# Patient Record
Sex: Female | Born: 1950 | Race: White | Hispanic: No | Smoking: Never smoker
Health system: Southern US, Community
[De-identification: ages and names within clinical notes are randomized; demographics above are authoritative.]

## PROBLEM LIST (undated history)

## (undated) DIAGNOSIS — E785 Hyperlipidemia, unspecified: Secondary | ICD-10-CM

## (undated) DIAGNOSIS — K2970 Gastritis, unspecified, without bleeding: Secondary | ICD-10-CM

## (undated) DIAGNOSIS — I10 Essential (primary) hypertension: Secondary | ICD-10-CM

## (undated) DIAGNOSIS — E119 Type 2 diabetes mellitus without complications: Secondary | ICD-10-CM

## (undated) DIAGNOSIS — E079 Disorder of thyroid, unspecified: Secondary | ICD-10-CM

## (undated) HISTORY — PX: EYE SURGERY: SHX253

## (undated) HISTORY — PX: KNEE SURGERY: SHX244

## (undated) HISTORY — PX: THYROID SURGERY: SHX805

## (undated) HISTORY — PX: OTHER SURGICAL HISTORY: SHX169

## (undated) HISTORY — DX: Hyperlipidemia, unspecified: E78.5

## (undated) HISTORY — DX: Essential (primary) hypertension: I10

---

## 2000-02-23 ENCOUNTER — Encounter: Payer: Self-pay | Admitting: *Deleted

## 2000-02-23 ENCOUNTER — Emergency Department (HOSPITAL_COMMUNITY): Admission: EM | Admit: 2000-02-23 | Discharge: 2000-02-23 | Payer: Self-pay | Admitting: Podiatry

## 2008-12-02 DIAGNOSIS — E890 Postprocedural hypothyroidism: Secondary | ICD-10-CM | POA: Insufficient documentation

## 2011-05-31 DIAGNOSIS — F430 Acute stress reaction: Secondary | ICD-10-CM | POA: Insufficient documentation

## 2016-05-08 DIAGNOSIS — Z8719 Personal history of other diseases of the digestive system: Secondary | ICD-10-CM | POA: Insufficient documentation

## 2016-12-26 DIAGNOSIS — M1712 Unilateral primary osteoarthritis, left knee: Secondary | ICD-10-CM | POA: Insufficient documentation

## 2017-02-05 LAB — HM COLONOSCOPY

## 2017-03-12 DIAGNOSIS — Z96652 Presence of left artificial knee joint: Secondary | ICD-10-CM | POA: Insufficient documentation

## 2018-08-31 DIAGNOSIS — E042 Nontoxic multinodular goiter: Secondary | ICD-10-CM | POA: Insufficient documentation

## 2018-09-23 ENCOUNTER — Encounter (HOSPITAL_COMMUNITY): Payer: Self-pay

## 2018-09-23 ENCOUNTER — Observation Stay (HOSPITAL_COMMUNITY)
Admission: EM | Admit: 2018-09-23 | Discharge: 2018-09-24 | Disposition: A | Discharge status: Home / Self Care | Payer: Medicare Other | Attending: Family Medicine | Admitting: Family Medicine

## 2018-09-23 ENCOUNTER — Emergency Department (HOSPITAL_COMMUNITY): Payer: Medicare Other

## 2018-09-23 ENCOUNTER — Other Ambulatory Visit: Payer: Self-pay

## 2018-09-23 ENCOUNTER — Observation Stay (HOSPITAL_BASED_OUTPATIENT_CLINIC_OR_DEPARTMENT_OTHER): Payer: Medicare Other

## 2018-09-23 ENCOUNTER — Observation Stay (HOSPITAL_COMMUNITY): Payer: Medicare Other

## 2018-09-23 DIAGNOSIS — R569 Unspecified convulsions: Secondary | ICD-10-CM | POA: Diagnosis present

## 2018-09-23 DIAGNOSIS — E785 Hyperlipidemia, unspecified: Secondary | ICD-10-CM | POA: Diagnosis not present

## 2018-09-23 DIAGNOSIS — I10 Essential (primary) hypertension: Secondary | ICD-10-CM | POA: Insufficient documentation

## 2018-09-23 DIAGNOSIS — Z79899 Other long term (current) drug therapy: Secondary | ICD-10-CM | POA: Diagnosis not present

## 2018-09-23 DIAGNOSIS — Z880 Allergy status to penicillin: Secondary | ICD-10-CM | POA: Insufficient documentation

## 2018-09-23 DIAGNOSIS — Z833 Family history of diabetes mellitus: Secondary | ICD-10-CM | POA: Insufficient documentation

## 2018-09-23 DIAGNOSIS — Z7984 Long term (current) use of oral hypoglycemic drugs: Secondary | ICD-10-CM | POA: Diagnosis not present

## 2018-09-23 DIAGNOSIS — R42 Dizziness and giddiness: Secondary | ICD-10-CM | POA: Diagnosis present

## 2018-09-23 DIAGNOSIS — Z87828 Personal history of other (healed) physical injury and trauma: Secondary | ICD-10-CM | POA: Insufficient documentation

## 2018-09-23 DIAGNOSIS — G40909 Epilepsy, unspecified, not intractable, without status epilepticus: Secondary | ICD-10-CM | POA: Diagnosis not present

## 2018-09-23 DIAGNOSIS — Z8249 Family history of ischemic heart disease and other diseases of the circulatory system: Secondary | ICD-10-CM | POA: Insufficient documentation

## 2018-09-23 DIAGNOSIS — Z79890 Hormone replacement therapy (postmenopausal): Secondary | ICD-10-CM | POA: Insufficient documentation

## 2018-09-23 DIAGNOSIS — E039 Hypothyroidism, unspecified: Secondary | ICD-10-CM | POA: Diagnosis not present

## 2018-09-23 DIAGNOSIS — E1159 Type 2 diabetes mellitus with other circulatory complications: Secondary | ICD-10-CM

## 2018-09-23 DIAGNOSIS — E119 Type 2 diabetes mellitus without complications: Secondary | ICD-10-CM | POA: Diagnosis not present

## 2018-09-23 HISTORY — DX: Gastritis, unspecified, without bleeding: K29.70

## 2018-09-23 HISTORY — DX: Disorder of thyroid, unspecified: E07.9

## 2018-09-23 HISTORY — DX: Type 2 diabetes mellitus without complications: E11.9

## 2018-09-23 LAB — CBC WITH DIFFERENTIAL/PLATELET
Abs Immature Granulocytes: 0.05 10*3/uL (ref 0.00–0.07)
Basophils Absolute: 0 10*3/uL (ref 0.0–0.1)
Basophils Relative: 0 %
Eosinophils Absolute: 0.1 10*3/uL (ref 0.0–0.5)
Eosinophils Relative: 2 %
HCT: 39 % (ref 36.0–46.0)
Hemoglobin: 13.3 g/dL (ref 12.0–15.0)
Immature Granulocytes: 1 %
Lymphocytes Relative: 19 %
Lymphs Abs: 1.4 10*3/uL (ref 0.7–4.0)
MCH: 30.2 pg (ref 26.0–34.0)
MCHC: 34.1 g/dL (ref 30.0–36.0)
MCV: 88.4 fL (ref 80.0–100.0)
Monocytes Absolute: 0.3 10*3/uL (ref 0.1–1.0)
Monocytes Relative: 5 %
Neutro Abs: 5.3 10*3/uL (ref 1.7–7.7)
Neutrophils Relative %: 73 %
Platelets: 270 10*3/uL (ref 150–400)
RBC: 4.41 MIL/uL (ref 3.87–5.11)
RDW: 12.3 % (ref 11.5–15.5)
WBC: 7.2 10*3/uL (ref 4.0–10.5)
nRBC: 0 % (ref 0.0–0.2)

## 2018-09-23 LAB — URINALYSIS, ROUTINE W REFLEX MICROSCOPIC
Bacteria, UA: NONE SEEN
Bilirubin Urine: NEGATIVE
Glucose, UA: >=500 mg/dL — AB
Hgb urine dipstick: NEGATIVE
Ketones, ur: NEGATIVE mg/dL
Leukocytes,Ua: NEGATIVE
Nitrite: NEGATIVE
Protein, ur: NEGATIVE mg/dL
Specific Gravity, Urine: 1.01 (ref 1.005–1.030)
pH: 7 (ref 5.0–8.0)

## 2018-09-23 LAB — COMPREHENSIVE METABOLIC PANEL
ALT: 12 U/L (ref 0–44)
AST: 16 U/L (ref 15–41)
Albumin: 3.8 g/dL (ref 3.5–5.0)
Alkaline Phosphatase: 76 U/L (ref 38–126)
Anion gap: 10 (ref 5–15)
BUN: 9 mg/dL (ref 8–23)
CO2: 23 mmol/L (ref 22–32)
Calcium: 8.4 mg/dL — ABNORMAL LOW (ref 8.9–10.3)
Chloride: 101 mmol/L (ref 98–111)
Creatinine, Ser: 0.56 mg/dL (ref 0.44–1.00)
GFR calc Af Amer: >60 mL/min (ref >60)
GFR calc non Af Amer: >60 mL/min (ref >60)
Glucose, Bld: 165 mg/dL — ABNORMAL HIGH (ref 70–99)
Potassium: 4.1 mmol/L (ref 3.5–5.1)
Sodium: 134 mmol/L — ABNORMAL LOW (ref 135–145)
Total Bilirubin: 0.5 mg/dL (ref 0.3–1.2)
Total Protein: 6.3 g/dL — ABNORMAL LOW (ref 6.5–8.1)

## 2018-09-23 LAB — TROPONIN I: Troponin I: <0.03 ng/mL (ref <0.03)

## 2018-09-23 LAB — GLUCOSE, CAPILLARY
Glucose-Capillary: 100 mg/dL — ABNORMAL HIGH (ref 70–99)
Glucose-Capillary: 175 mg/dL — ABNORMAL HIGH (ref 70–99)

## 2018-09-23 LAB — CBG MONITORING, ED: Glucose-Capillary: 188 mg/dL — ABNORMAL HIGH (ref 70–99)

## 2018-09-23 LAB — ECHOCARDIOGRAM COMPLETE

## 2018-09-23 IMAGING — CT CT HEAD WITHOUT CONTRAST
3 series · 16 of 47 positions shown, 19 images · non-contrast
Comparison: None.

CLINICAL DATA: Altered level of consciousness

EXAM:
CT HEAD WITHOUT CONTRAST
TECHNIQUE: Contiguous axial images were obtained from the base of the skull
through the vertex without intravenous contrast.

[Series 2: head trauma wo · axial · 0.43mm/px · z∈[+59,+184]mm · 10 of 31 slices shown, 13 images]
[im 3/31  brain]
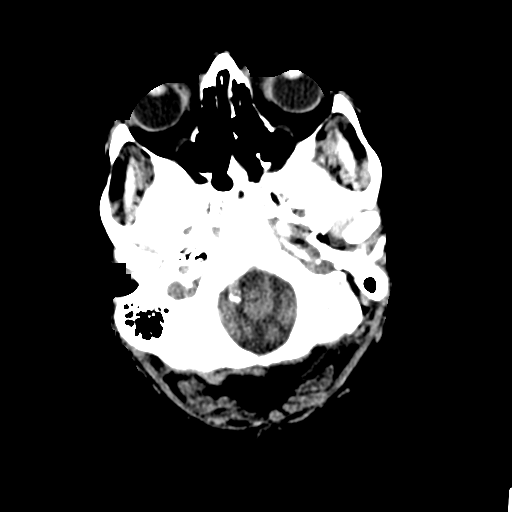
[im 3/31  bone]
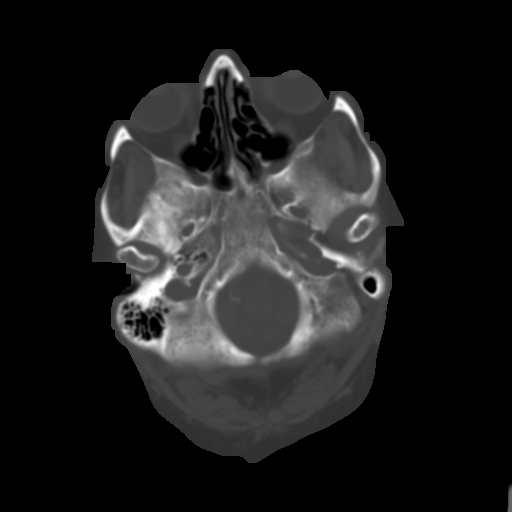
[im 6/31  brain]
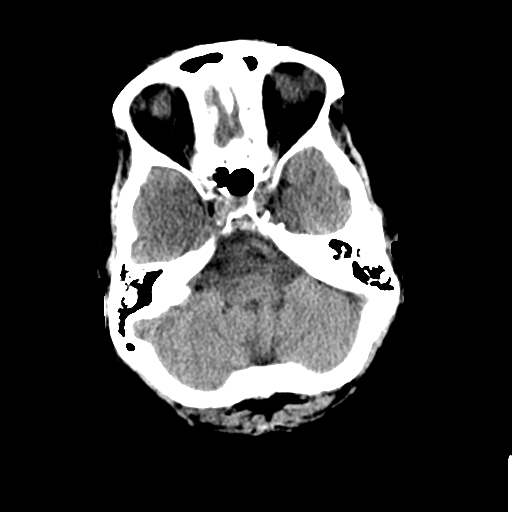
[im 9/31  brain]
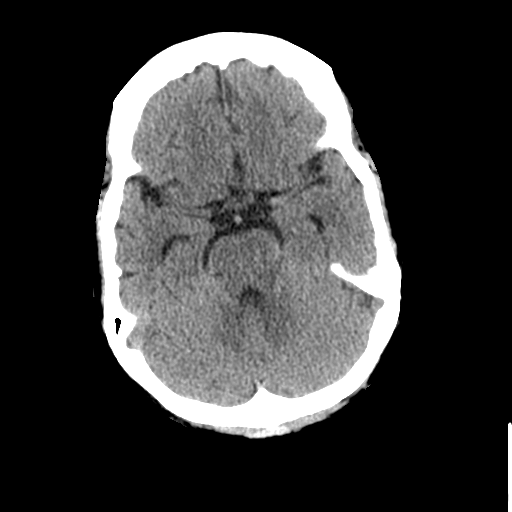
[im 11/31  brain]
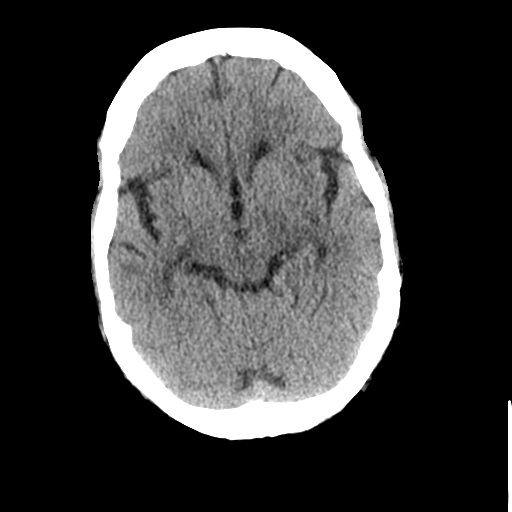
[im 14/31  brain]
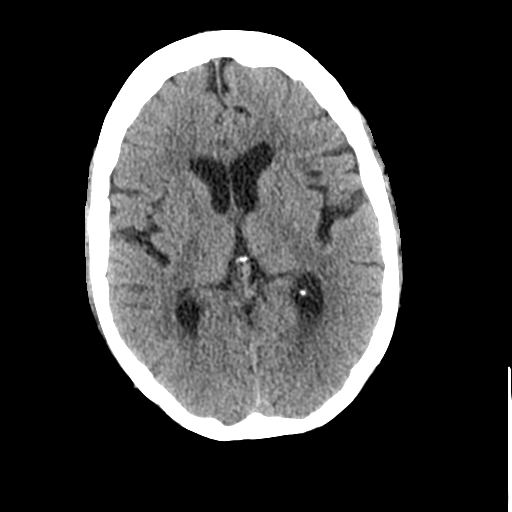
[im 14/31  bone]
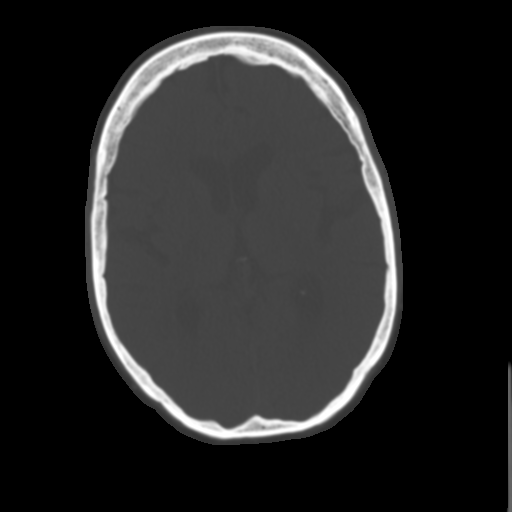
[im 17/31  brain]
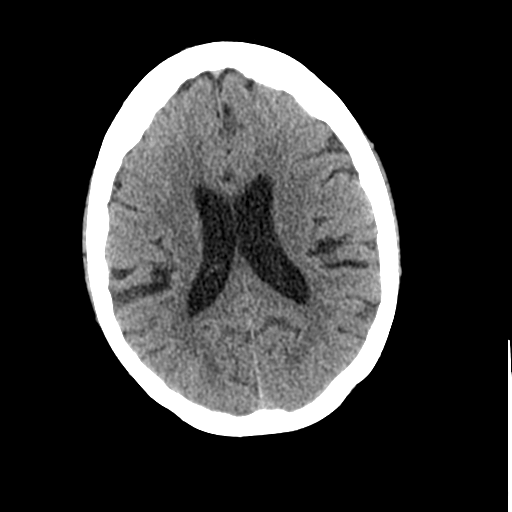
[im 20/31  brain]
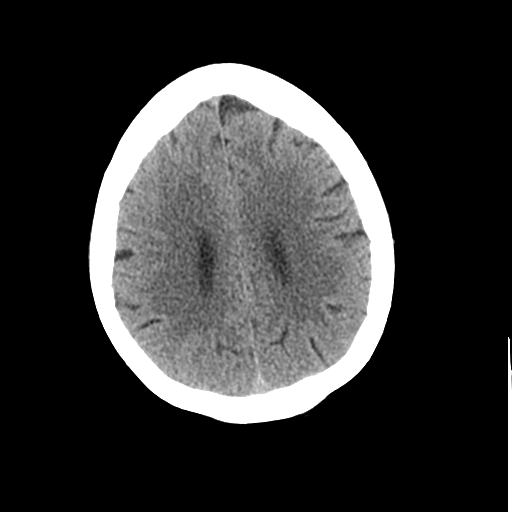
[im 23/31  brain]
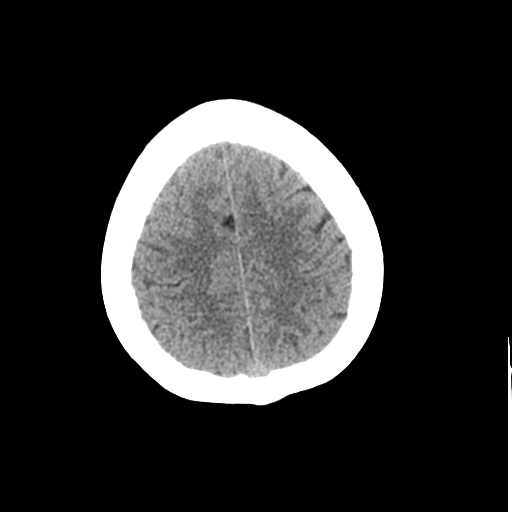
[im 25/31  brain]
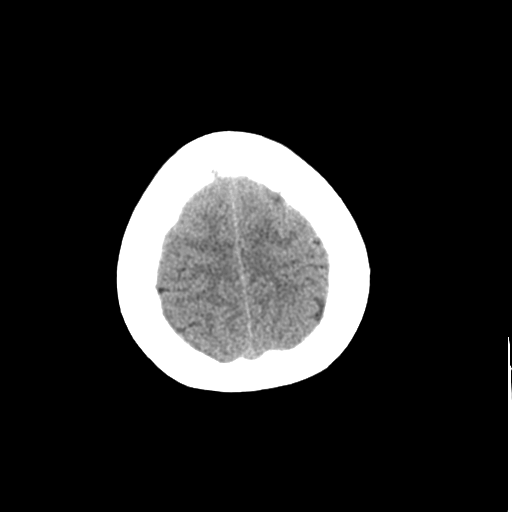
[im 25/31  bone]
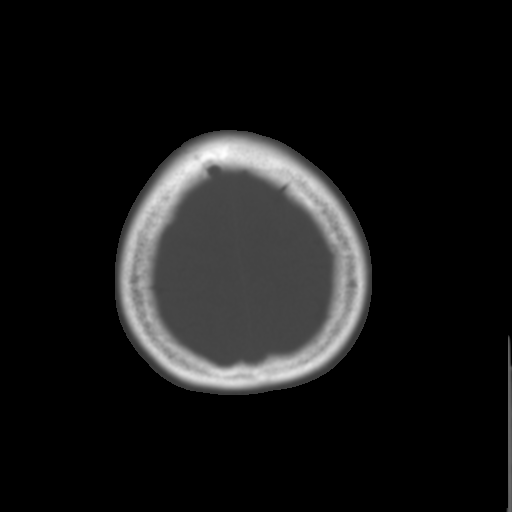
[im 28/31  brain]
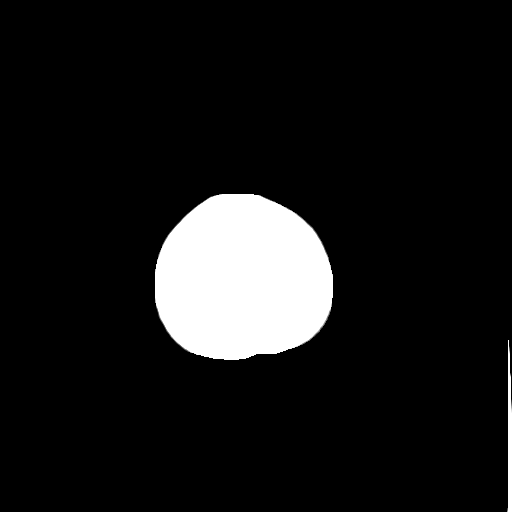

[Series 4: coronal soft tissue · coronal · 0.31mm/px · 3 of 61 slices shown]
[im 21/61  brain]
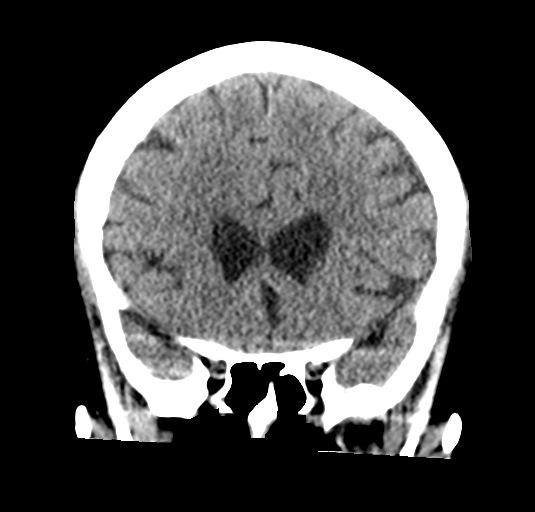
[im 27/61  brain]
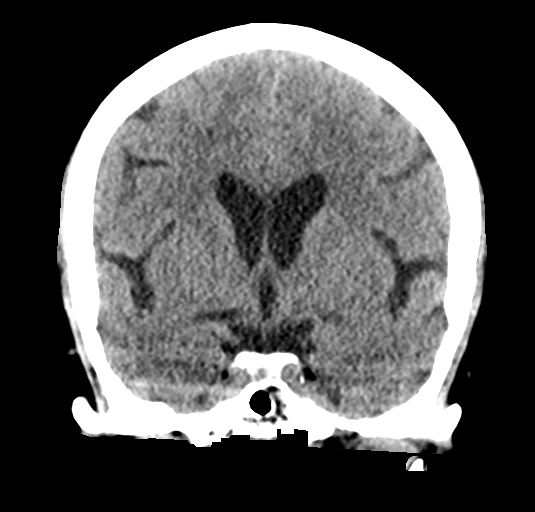
[im 34/61  brain]
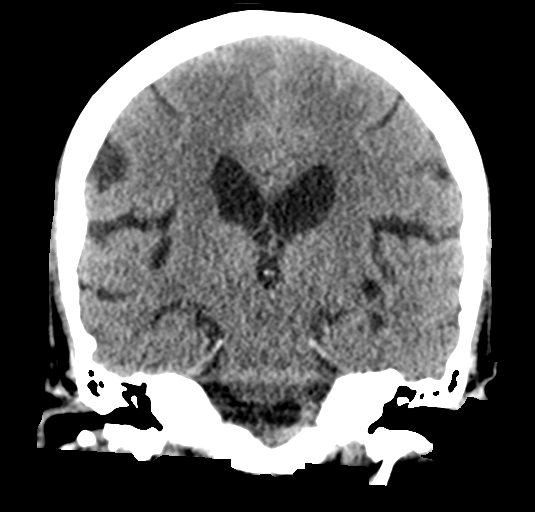

[Series 5: sagittal soft tissue · sagittal · 0.31mm/px · 3 of 55 slices shown]
[im 19/55  brain]
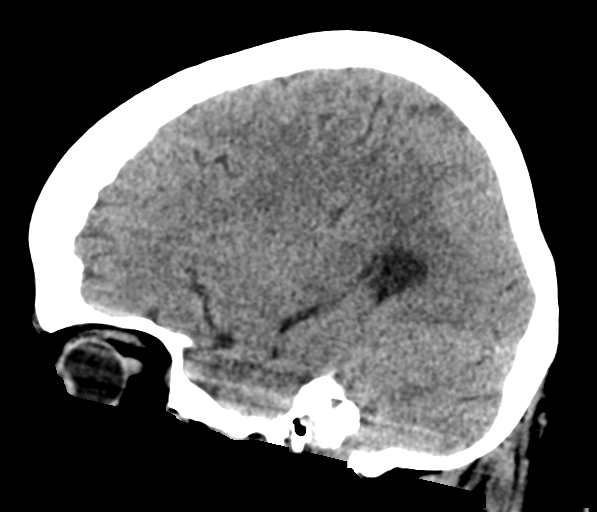
[im 28/55  brain]
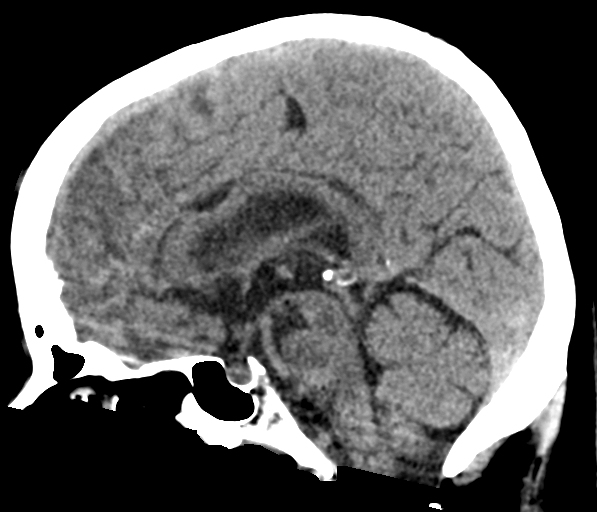
[im 37/55  brain]
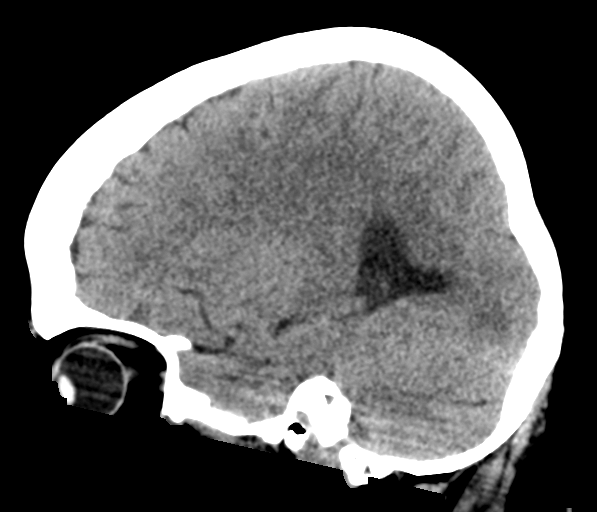

[16 of 47 positions shown; findings below may reference images not displayed]

FINDINGS: Brain: No evidence of acute infarction, hemorrhage, extra-axial
collection, ventriculomegaly, or mass effect. Generalized cerebral
atrophy. Periventricular white matter low attenuation likely
secondary to microangiopathy.

Vascular: Cerebrovascular atherosclerotic calcifications are noted.

Skull: Negative for fracture or focal lesion.

Sinuses/Orbits: Visualized portions of the orbits are unremarkable.
Visualized portions of the paranasal sinuses and mastoid air cells
are unremarkable.

Other: None.
IMPRESSION: No acute intracranial pathology.

## 2018-09-23 IMAGING — DX CHEST - 2 VIEW
2 series · 2 of 2 positions shown · non-contrast
Comparison: None.

CLINICAL DATA: Nausea and vomiting

EXAM:
CHEST - 2 VIEW

[chest lat]
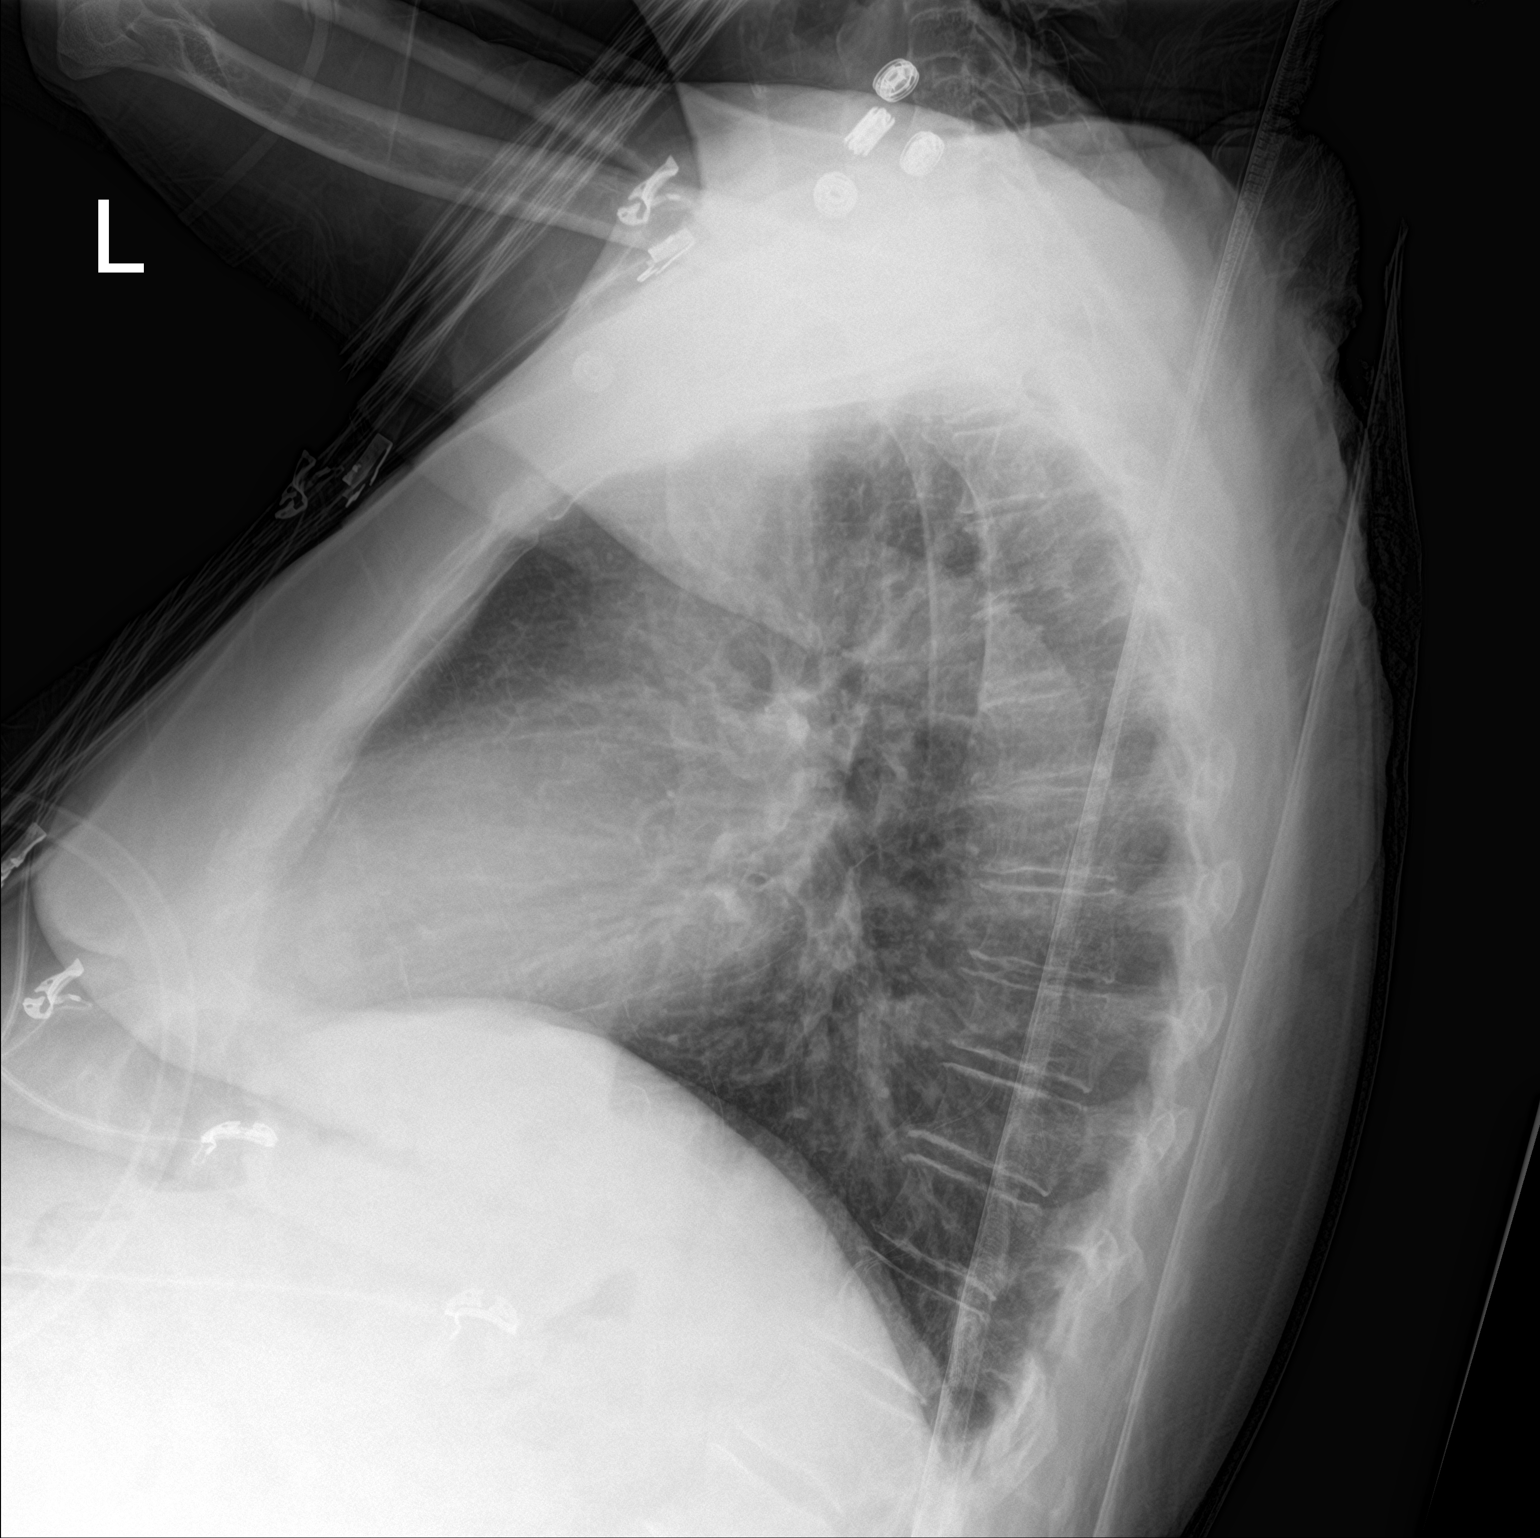

[chest ap]
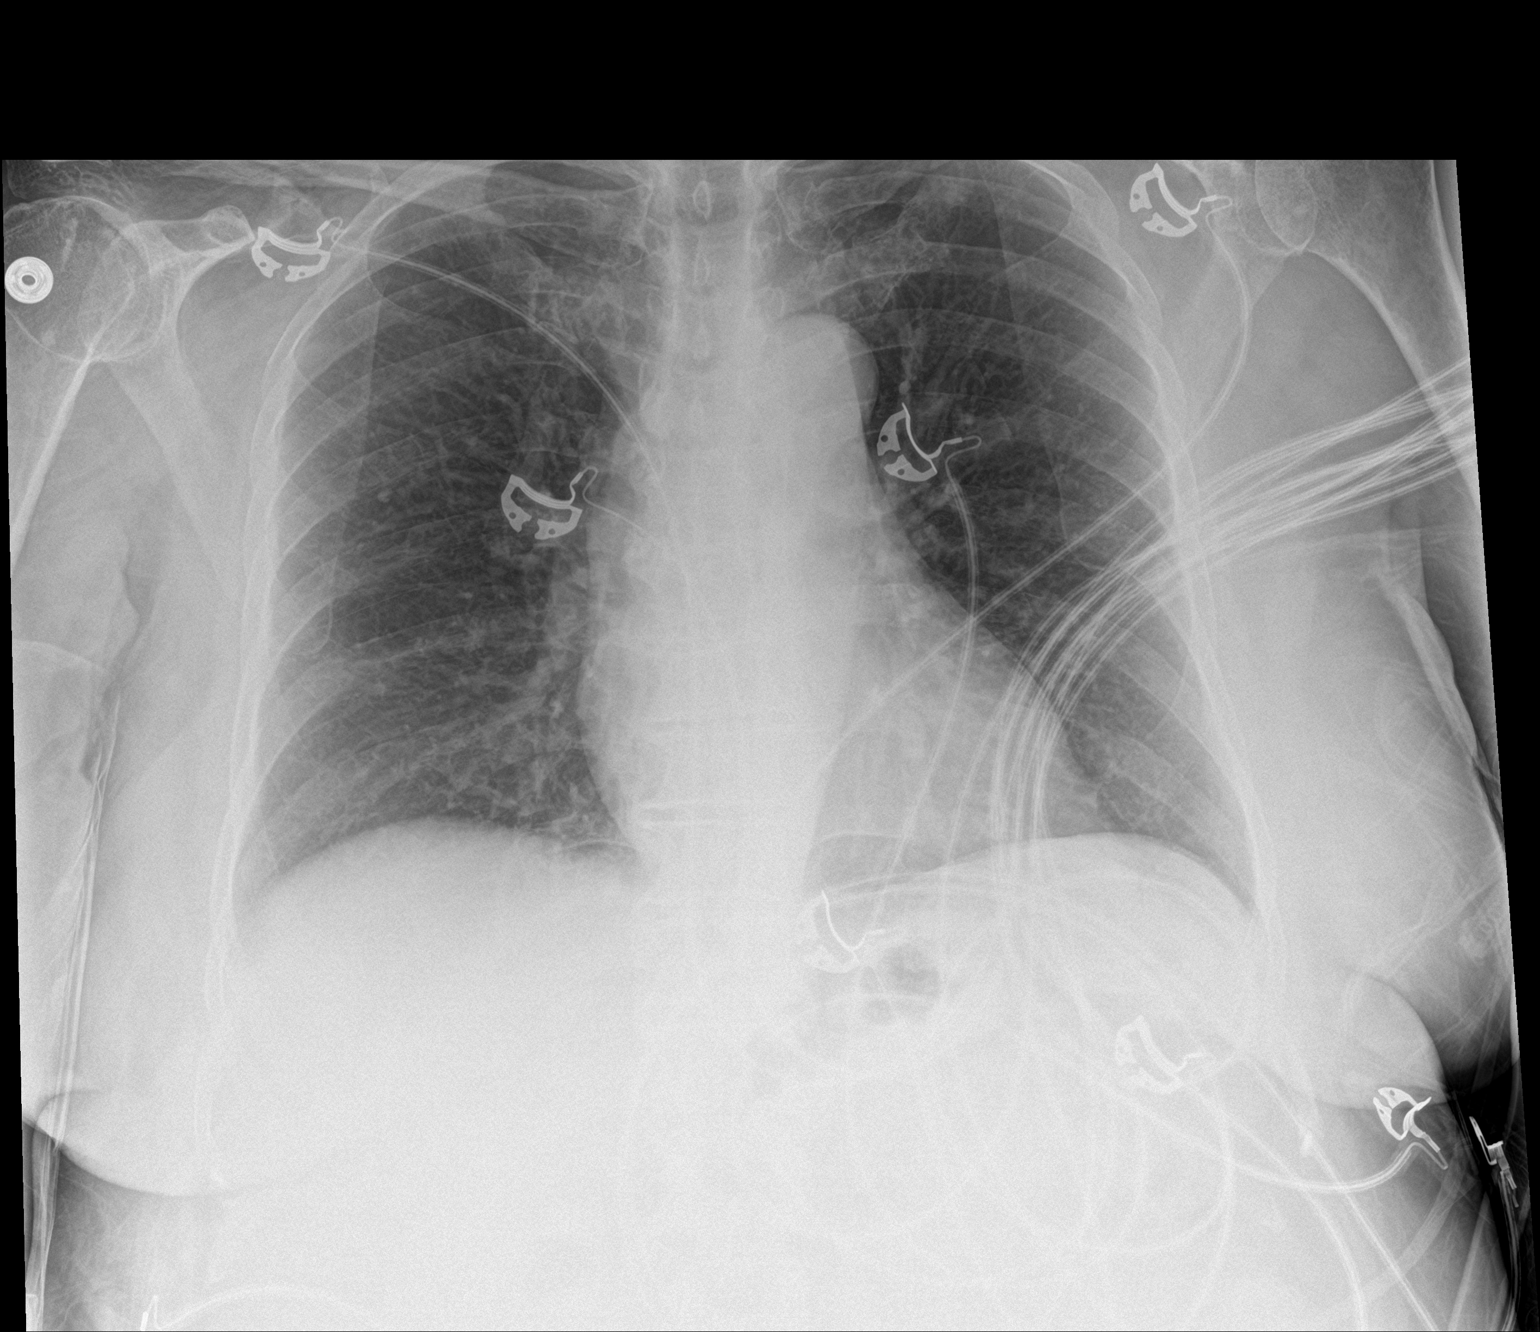

[2 of 2 positions shown; findings below may reference images not displayed]

FINDINGS: The heart size and mediastinal contours are within normal limits.
Both lungs are clear. The visualized skeletal structures are
unremarkable.
IMPRESSION: No active cardiopulmonary disease.

## 2018-09-23 IMAGING — MR MRA HEAD WITHOUT CONTRAST
1 series · 14 of 48 positions shown · IV contrast (gadavist)
Comparison: Prior head CT from earlier the same day.

CLINICAL DATA: Initial evaluation for acute dizziness, syncope,
seizures.

EXAM:
MRI HEAD WITHOUT AND WITH CONTRAST
MRA HEAD WITHOUT CONTRAST
MRA NECK WITHOUT AND WITH CONTRAST
TECHNIQUE: Multiplanar, multiecho pulse sequences of the brain and surrounding
structures were obtained without intravenous contrast. Angiographic
images of the Circle of Willis were obtained using MRA technique
without intravenous contrast. Angiographic images of the neck were
obtained using MRA technique without and with intravenous contrast.
Carotid stenosis measurements (when applicable) are obtained
utilizing NASCET criteria, using the distal internal carotid
diameter as the denominator.
CONTRAST:  6 cc of Gadavist.

[Series 2: MRA · axial · 0.8mm · 0.38mm/px · z∈[-109,-10]mm · 14 of 131 slices shown]
[im 1/131]
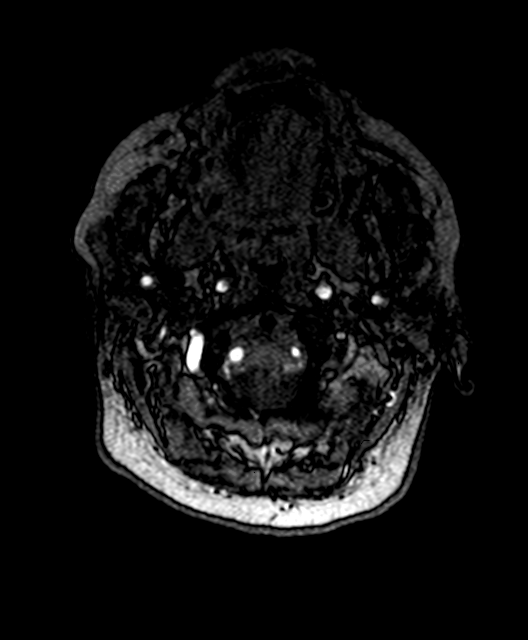
[im 3/131]
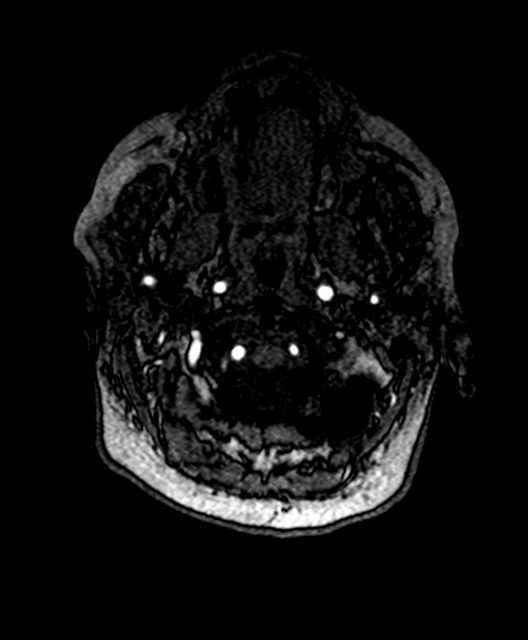
[im 6/131]
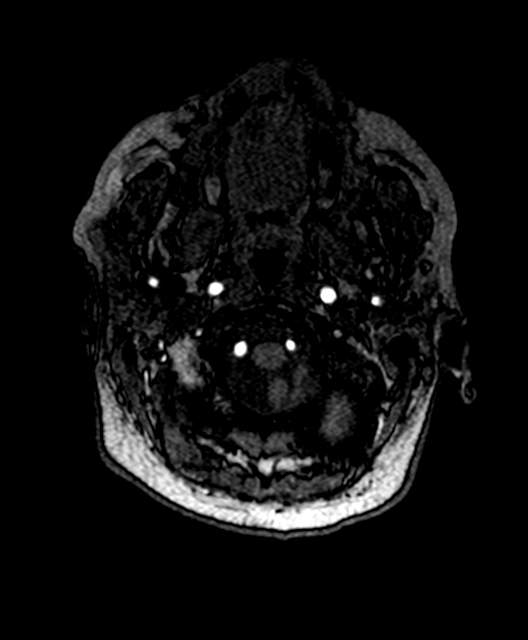
[im 9/131]
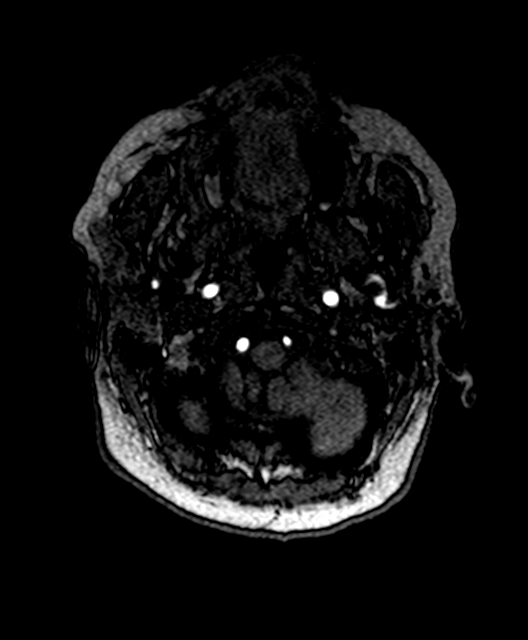
[im 23/131]
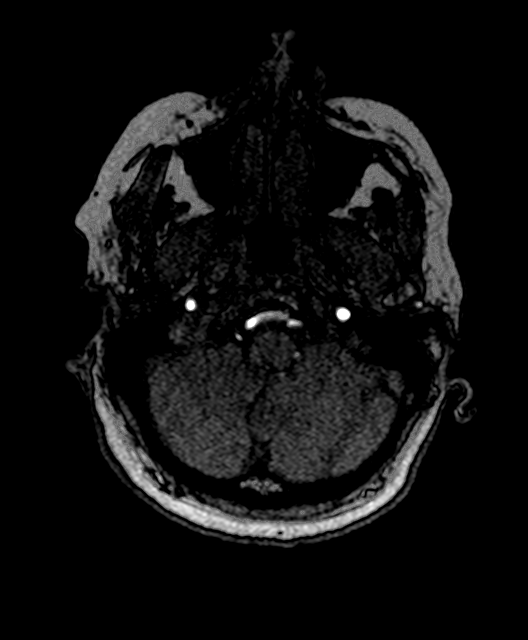
[im 25/131]
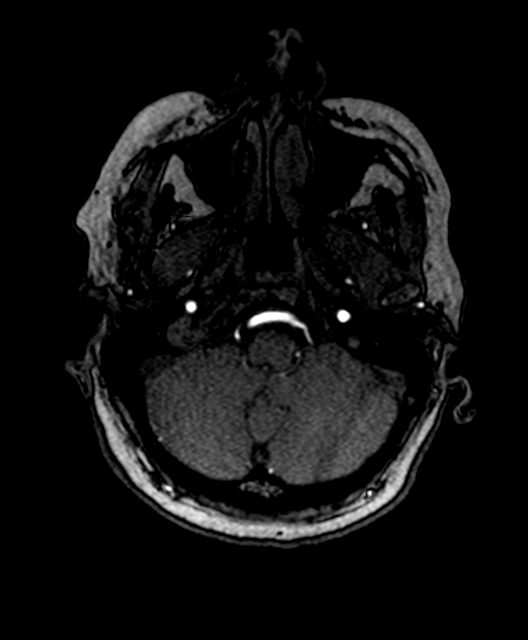
[im 42/131]
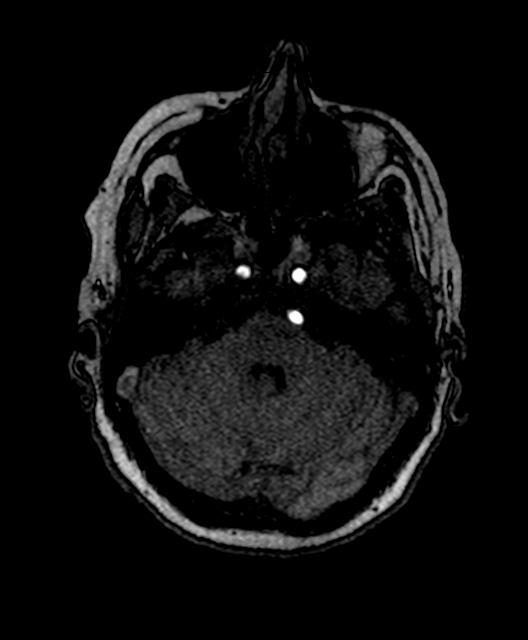
[im 59/131]
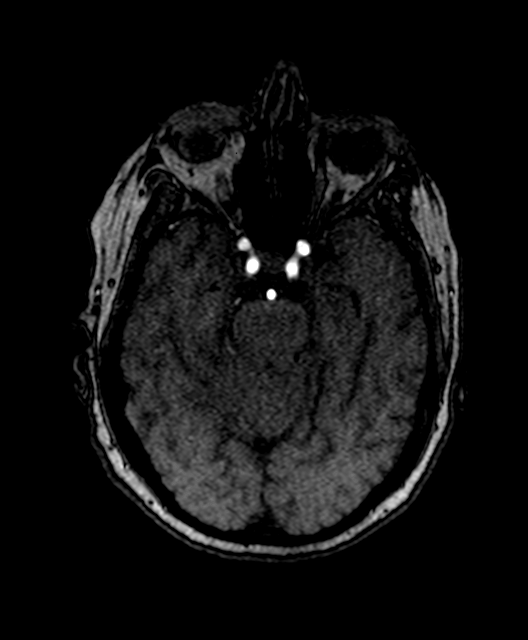
[im 67/131]
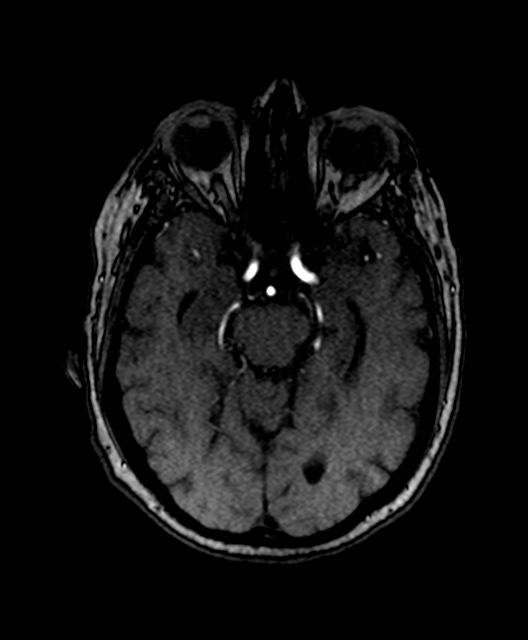
[im 75/131]
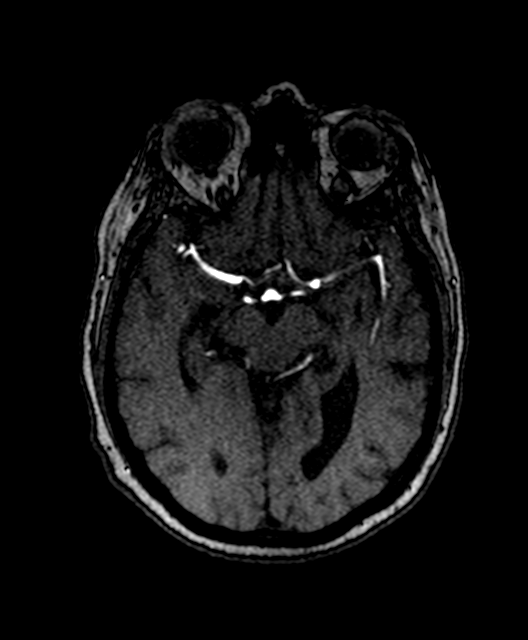
[im 92/131]
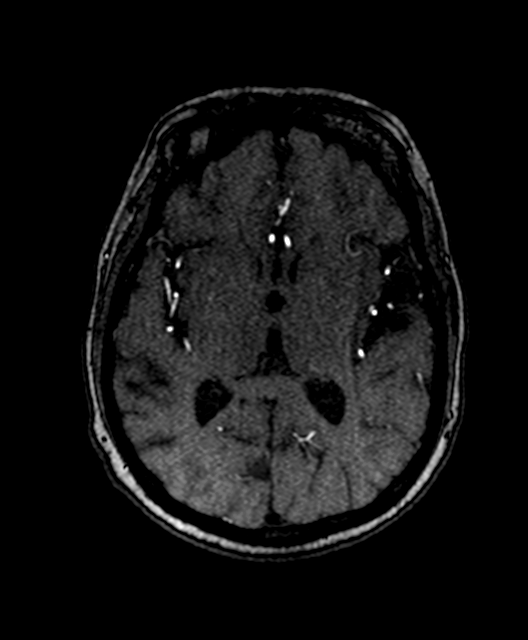
[im 108/131]
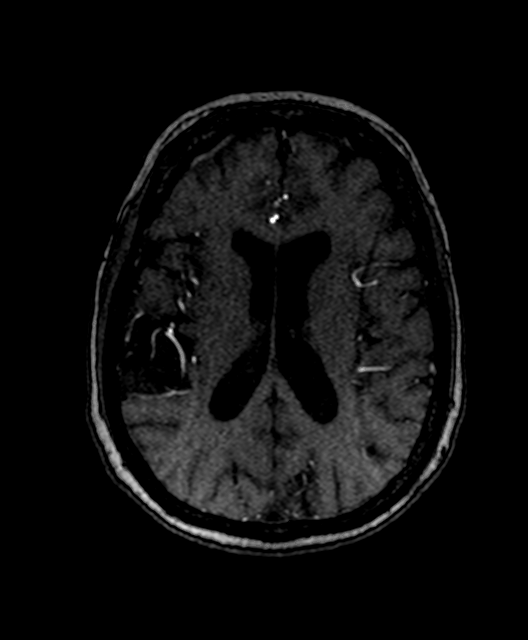
[im 111/131]
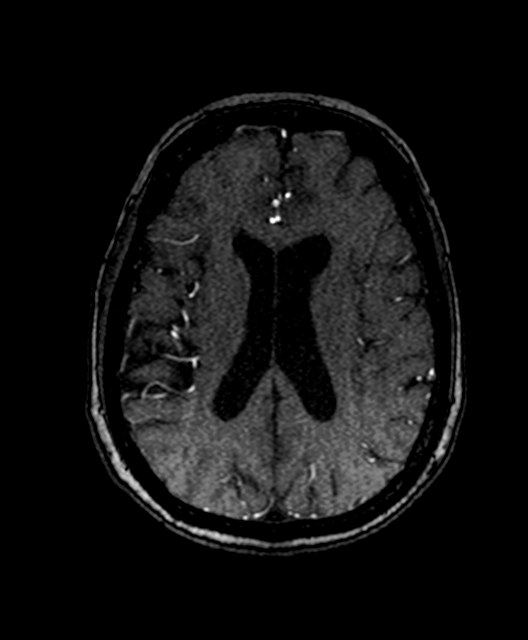
[im 125/131]
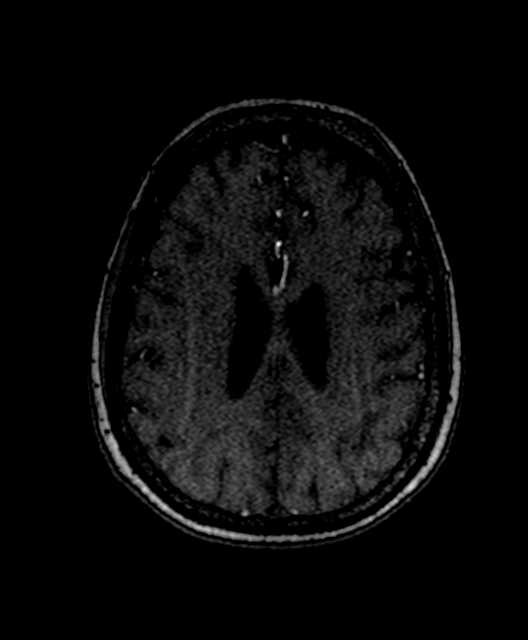

[14 of 48 positions shown; findings below may reference images not displayed]

FINDINGS: MRI HEAD FINDINGS

Brain: Cerebral volume within normal limits for patient age. Mild
hazy T2/FLAIR hyperintensity seen within the periventricular white
matter, nonspecific, but felt to be within normal limits for age.

No abnormal foci of restricted diffusion to suggest acute or
subacute ischemia. Gray-white matter differentiation well
maintained. No encephalomalacia to suggest chronic infarction. No
foci of susceptibility artifact to suggest acute or chronic
intracranial hemorrhage.

No mass lesion, midline shift or mass effect. No hydrocephalus. No
extra-axial fluid collection. Major dural sinuses are grossly
patent.

Pituitary gland and suprasellar region are normal. Midline
structures intact and normal.

No abnormal enhancement.  No intrinsic temporal lobe abnormality.

Vascular: Major intracranial vascular flow voids well maintained and
normal in appearance.

Skull and upper cervical spine: Craniocervical junction normal.
Visualized upper cervical spine within normal limits. Bone marrow
signal intensity normal. No scalp soft tissue abnormality.

Sinuses/Orbits: Globes and orbital soft tissues within normal
limits.

Paranasal sinuses are clear. No mastoid effusion. Inner ear
structures normal.

Other: None.

MRA HEAD FINDINGS

ANTERIOR CIRCULATION:

Distal cervical segments of the internal carotid arteries are patent
with symmetric antegrade flow. Petrous, cavernous, and supraclinoid
segments patent without hemodynamically significant stenosis. ICA
termini well perfused. Left A1 widely patent. Right A1 hypoplastic
but patent as well. Normal anterior communicating artery. Anterior
cerebral arteries widely patent to their distal aspects without
stenosis. M1 segments widely patent without stenosis. Normal MCA
bifurcations. Distal MCA branches well perfused and symmetric.

POSTERIOR CIRCULATION:

Vertebral arteries patent to the vertebrobasilar junction without
stenosis. Right vertebral artery dominant. Posterior inferior
cerebral arteries patent bilaterally. Basilar widely patent to its
distal aspect. Superior cerebellar and posterior cerebral arteries
well perfused bilaterally.

No intracranial aneurysm or other vascular abnormality.

MRA NECK FINDINGS

Source images reviewed. Patent antegrade flow seen within both
carotid and vertebral arteries bilaterally on time-of-flight
sequence.

Visualized aortic arch of normal caliber with normal branch pattern.
No flow-limiting stenosis seen about the origin of the great
vessels. Visualized subclavian arteries widely patent.

Right common carotid artery patent from its origin to the
bifurcation without visible stenosis. No significant atheromatous
narrowing about the right bifurcation. Right ICA widely patent to
the skull base without stenosis, occlusion, or evidence for
dissection.

Left common carotid artery patent from its origin to the bifurcation
without stenosis. No significant atheromatous narrowing about the
left bifurcation. Left ICA patent from the bifurcation to the skull
base without stenosis, occlusion, or evidence for dissection.

Both vertebral arteries arise from the subclavian arteries. Right
vertebral artery dominant. Vertebral arteries widely patent within
the neck without stenosis, dissection, or occlusion.
IMPRESSION: MRI HEAD IMPRESSION:

Normal brain MRI. No acute intracranial abnormality or findings to
explain patient's symptoms identified.

MRA HEAD IMPRESSION:

Normal intracranial MRA.

MRA NECK IMPRESSION:

Normal MRA of the neck.

## 2018-09-23 IMAGING — MR MRI AND MRA NECK WITHOUT AND WITH CONTRAST
2 series · 19 of 48 positions shown · IV contrast (gadavist)
Comparison: Prior head CT from earlier the same day.

CLINICAL DATA: Initial evaluation for acute dizziness, syncope,
seizures.

EXAM:
MRI HEAD WITHOUT AND WITH CONTRAST
MRA HEAD WITHOUT CONTRAST
MRA NECK WITHOUT AND WITH CONTRAST
TECHNIQUE: Multiplanar, multiecho pulse sequences of the brain and surrounding
structures were obtained without intravenous contrast. Angiographic
images of the Circle of Willis were obtained using MRA technique
without intravenous contrast. Angiographic images of the neck were
obtained using MRA technique without and with intravenous contrast.
Carotid stenosis measurements (when applicable) are obtained
utilizing NASCET criteria, using the distal internal carotid
diameter as the denominator.
CONTRAST:  6 cc of Gadavist.

[Series 4: [id number] · axial · 1.0mm · 0.52mm/px · z∈[-220,-94]mm · 10 of 136 slices shown]
[im 5/136]
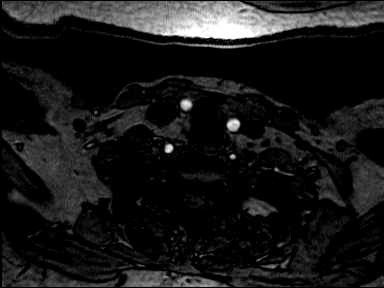
[im 24/136]
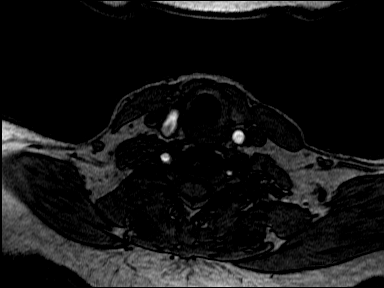
[im 42/136]
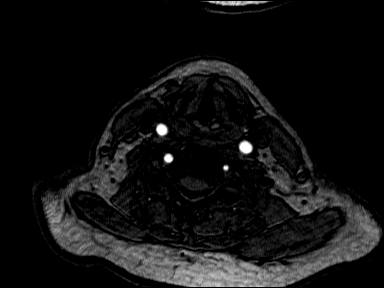
[im 61/136]
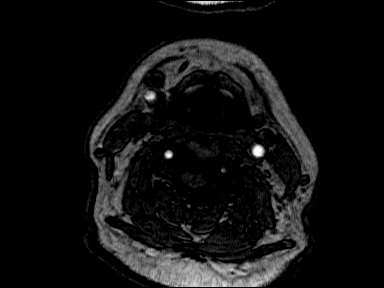
[im 70/136]
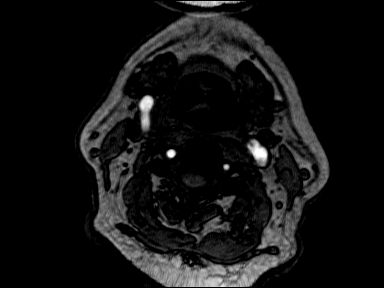
[im 75/136]
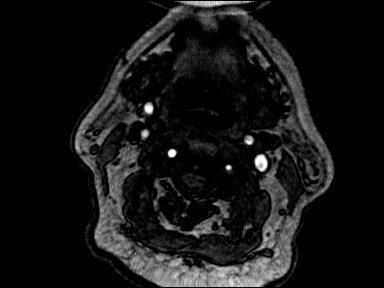
[im 94/136]
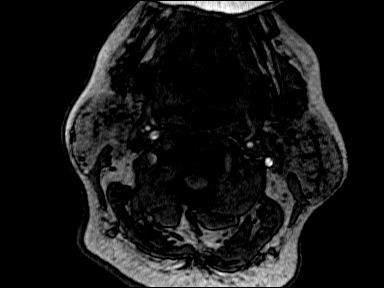
[im 112/136]
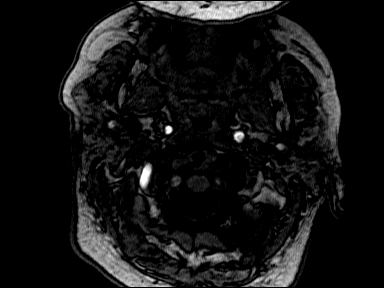
[im 117/136]
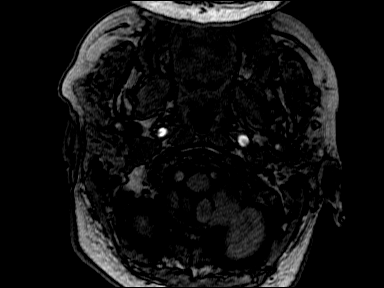
[im 131/136]
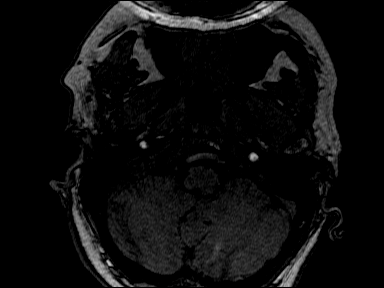

[Series 103: [id number] · coronal · 1.2mm · 0.41mm/px · 9 of 79 slices shown]
[im 5/79]
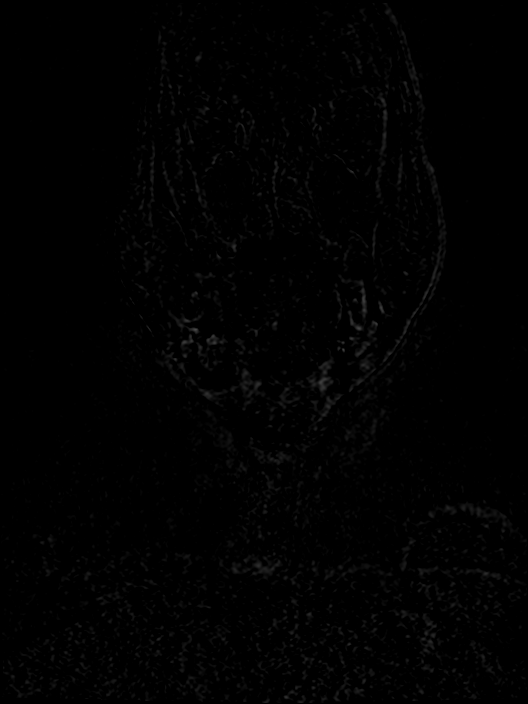
[im 14/79]
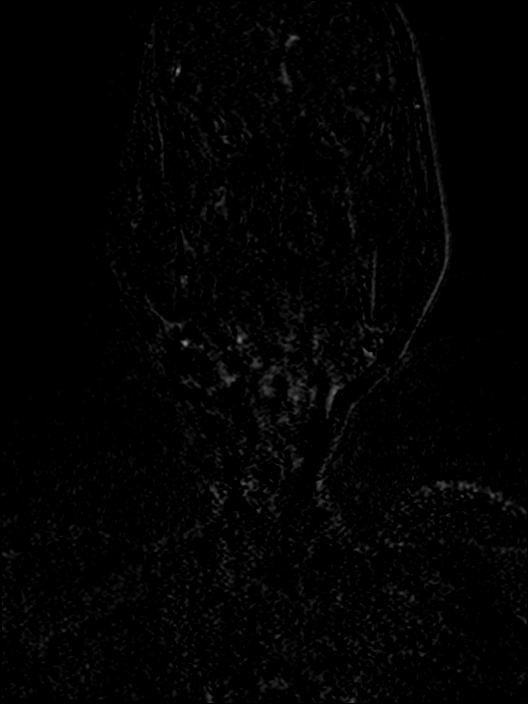
[im 23/79]
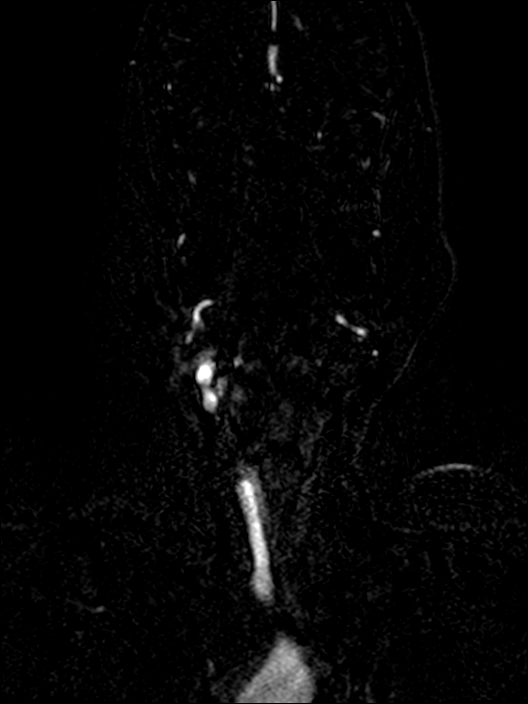
[im 33/79]
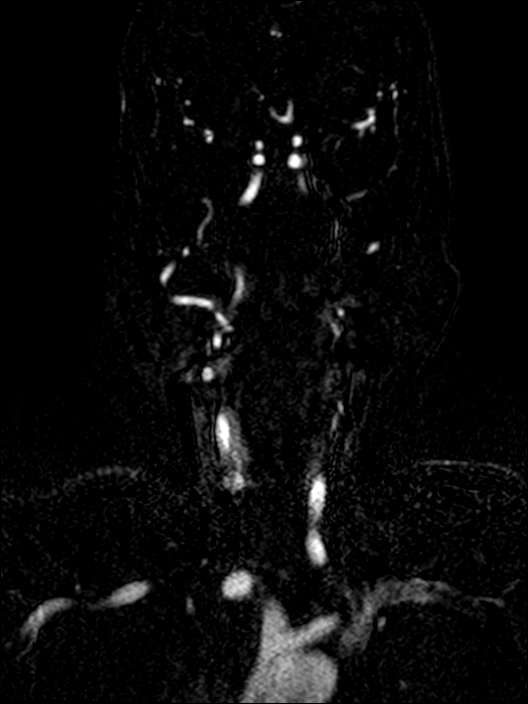
[im 42/79]
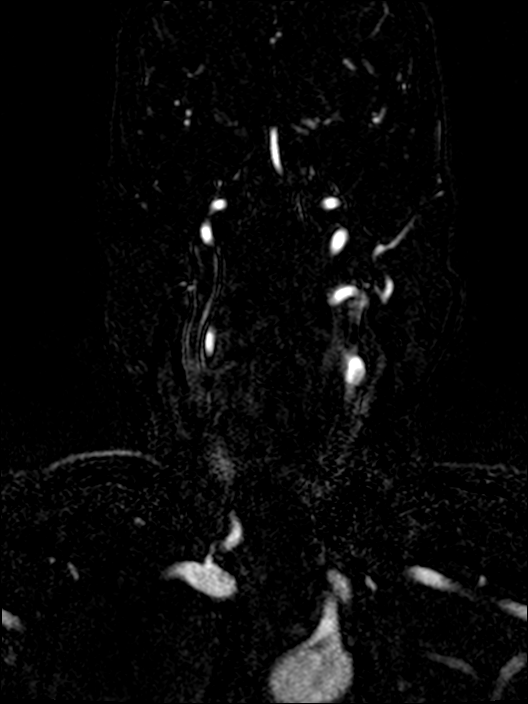
[im 46/79]
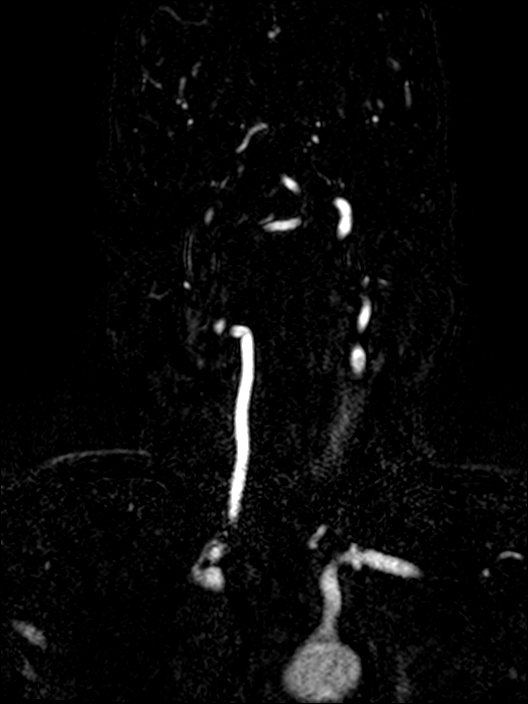
[im 56/79]
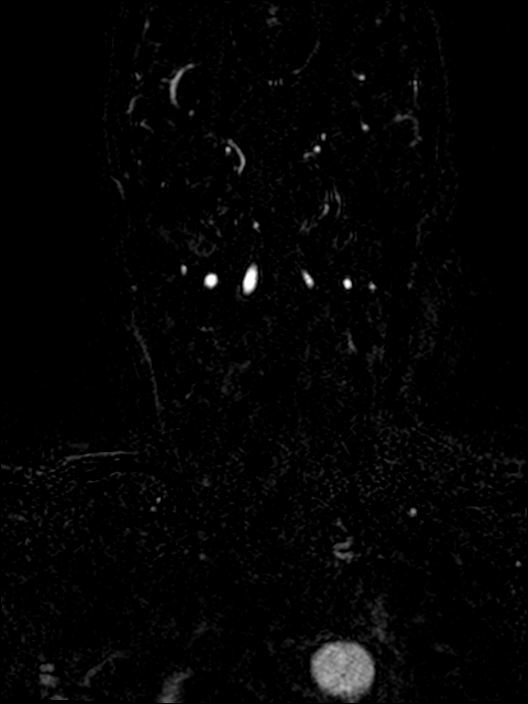
[im 65/79]
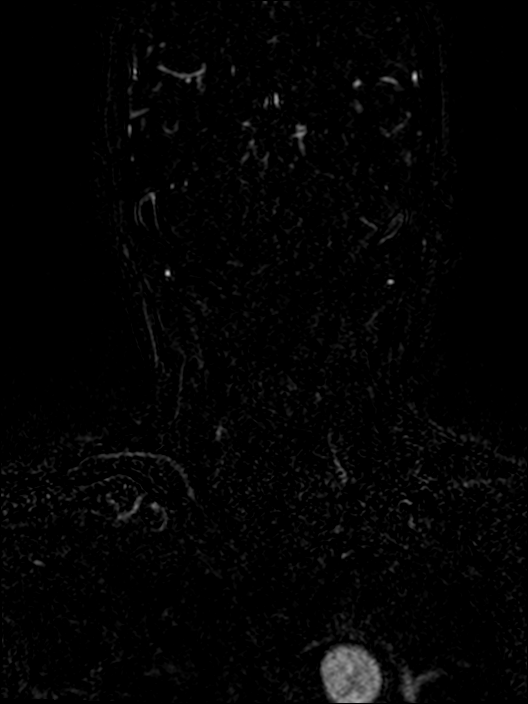
[im 69/79]
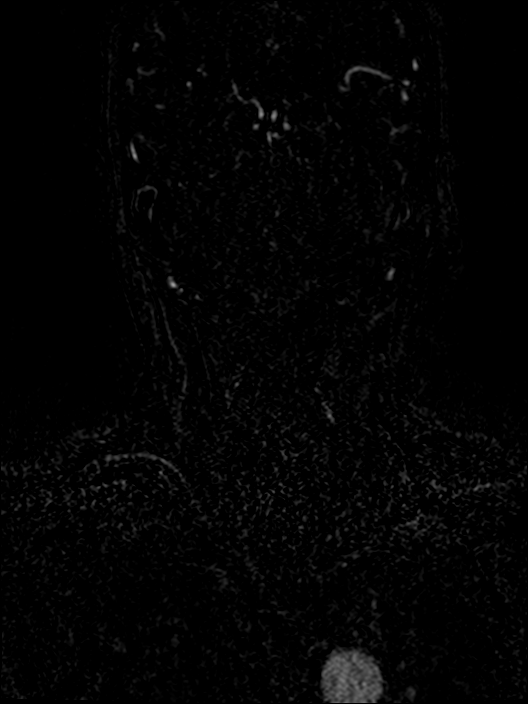

[19 of 48 positions shown; findings below may reference images not displayed]

FINDINGS: MRI HEAD FINDINGS

Brain: Cerebral volume within normal limits for patient age. Mild
hazy T2/FLAIR hyperintensity seen within the periventricular white
matter, nonspecific, but felt to be within normal limits for age.

No abnormal foci of restricted diffusion to suggest acute or
subacute ischemia. Gray-white matter differentiation well
maintained. No encephalomalacia to suggest chronic infarction. No
foci of susceptibility artifact to suggest acute or chronic
intracranial hemorrhage.

No mass lesion, midline shift or mass effect. No hydrocephalus. No
extra-axial fluid collection. Major dural sinuses are grossly
patent.

Pituitary gland and suprasellar region are normal. Midline
structures intact and normal.

No abnormal enhancement.  No intrinsic temporal lobe abnormality.

Vascular: Major intracranial vascular flow voids well maintained and
normal in appearance.

Skull and upper cervical spine: Craniocervical junction normal.
Visualized upper cervical spine within normal limits. Bone marrow
signal intensity normal. No scalp soft tissue abnormality.

Sinuses/Orbits: Globes and orbital soft tissues within normal
limits.

Paranasal sinuses are clear. No mastoid effusion. Inner ear
structures normal.

Other: None.

MRA HEAD FINDINGS

ANTERIOR CIRCULATION:

Distal cervical segments of the internal carotid arteries are patent
with symmetric antegrade flow. Petrous, cavernous, and supraclinoid
segments patent without hemodynamically significant stenosis. ICA
termini well perfused. Left A1 widely patent. Right A1 hypoplastic
but patent as well. Normal anterior communicating artery. Anterior
cerebral arteries widely patent to their distal aspects without
stenosis. M1 segments widely patent without stenosis. Normal MCA
bifurcations. Distal MCA branches well perfused and symmetric.

POSTERIOR CIRCULATION:

Vertebral arteries patent to the vertebrobasilar junction without
stenosis. Right vertebral artery dominant. Posterior inferior
cerebral arteries patent bilaterally. Basilar widely patent to its
distal aspect. Superior cerebellar and posterior cerebral arteries
well perfused bilaterally.

No intracranial aneurysm or other vascular abnormality.

MRA NECK FINDINGS

Source images reviewed. Patent antegrade flow seen within both
carotid and vertebral arteries bilaterally on time-of-flight
sequence.

Visualized aortic arch of normal caliber with normal branch pattern.
No flow-limiting stenosis seen about the origin of the great
vessels. Visualized subclavian arteries widely patent.

Right common carotid artery patent from its origin to the
bifurcation without visible stenosis. No significant atheromatous
narrowing about the right bifurcation. Right ICA widely patent to
the skull base without stenosis, occlusion, or evidence for
dissection.

Left common carotid artery patent from its origin to the bifurcation
without stenosis. No significant atheromatous narrowing about the
left bifurcation. Left ICA patent from the bifurcation to the skull
base without stenosis, occlusion, or evidence for dissection.

Both vertebral arteries arise from the subclavian arteries. Right
vertebral artery dominant. Vertebral arteries widely patent within
the neck without stenosis, dissection, or occlusion.
IMPRESSION: MRI HEAD IMPRESSION:

Normal brain MRI. No acute intracranial abnormality or findings to
explain patient's symptoms identified.

MRA HEAD IMPRESSION:

Normal intracranial MRA.

MRA NECK IMPRESSION:

Normal MRA of the neck.

## 2018-09-23 IMAGING — MR MRI HEAD WITHOUT AND WITH CONTRAST
7 of 14 series · 25 of 48 positions shown · IV contrast (gadavist)
Comparison: Prior head CT from earlier the same day.

CLINICAL DATA: Initial evaluation for acute dizziness, syncope,
seizures.

EXAM:
MRI HEAD WITHOUT AND WITH CONTRAST
MRA HEAD WITHOUT CONTRAST
MRA NECK WITHOUT AND WITH CONTRAST
TECHNIQUE: Multiplanar, multiecho pulse sequences of the brain and surrounding
structures were obtained without intravenous contrast. Angiographic
images of the Circle of Willis were obtained using MRA technique
without intravenous contrast. Angiographic images of the neck were
obtained using MRA technique without and with intravenous contrast.
Carotid stenosis measurements (when applicable) are obtained
utilizing NASCET criteria, using the distal internal carotid
diameter as the denominator.
CONTRAST:  6 cc of Gadavist.

[Series 2: DWI · axial · 3.0mm · 0.63mm/px · z∈[-125,+36]mm · 4 of 55 slices shown (1 of 4)]
[im 1/55]
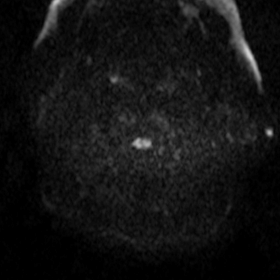
[im 19/55]
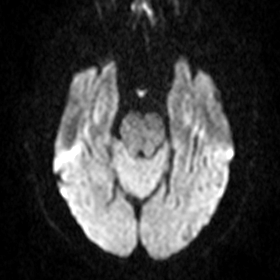
[im 37/55]
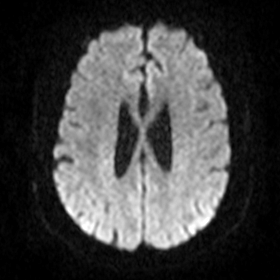
[im 55/55]
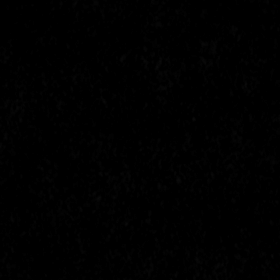

[Series 3: DWI · axial · 3.0mm · 0.63mm/px · z∈[-125,+33]mm · 4 of 54 slices shown (2 of 4)]
[im 1/54]
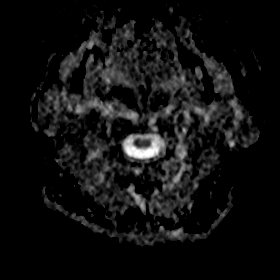
[im 18/54]
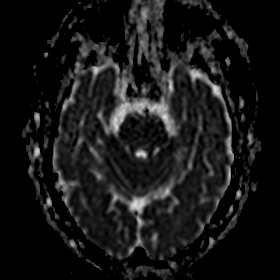
[im 36/54]
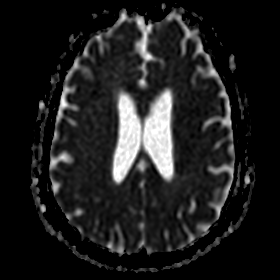
[im 54/54]
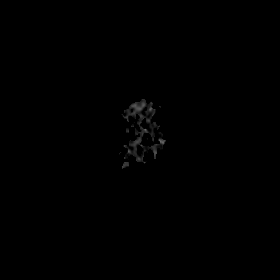

[Series 4: DWI · coronal · 5.0mm · 0.41mm/px · 3 of 34 slices shown (3 of 4)]
[im 1/34]
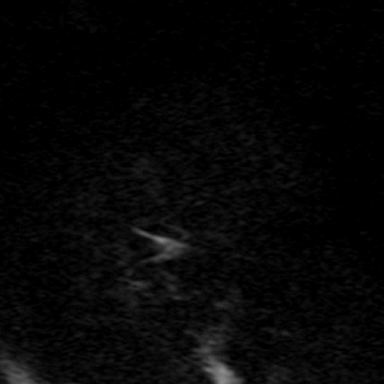
[im 17/34]
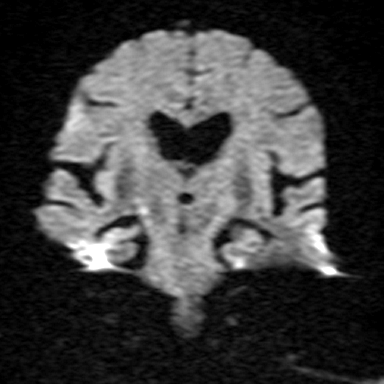
[im 34/34]
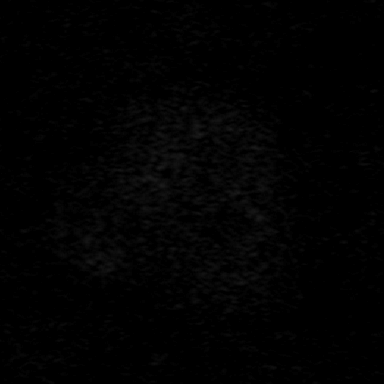

[Series 5: DWI · coronal · 5.0mm · 0.41mm/px · 3 of 34 slices shown (4 of 4)]
[im 1/34]
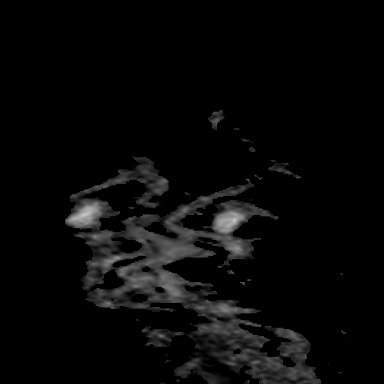
[im 17/34]
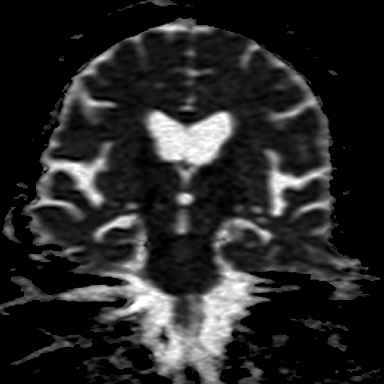
[im 34/34]
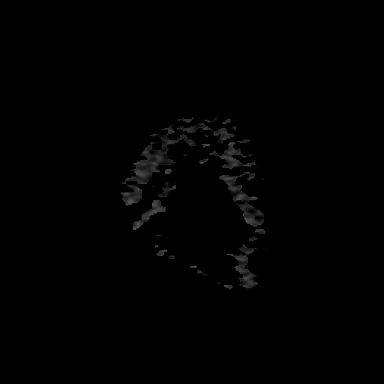

[Series 7: FLAIR · axial · 3.0mm · 0.76mm/px · z∈[-114,+30]mm · 4 of 49 slices shown (1 of 2)]
[im 1/49]
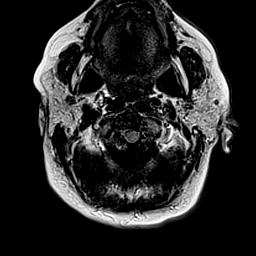
[im 17/49]
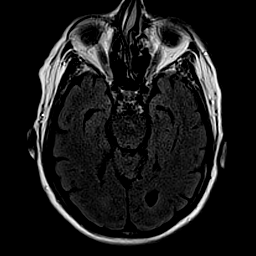
[im 33/49]
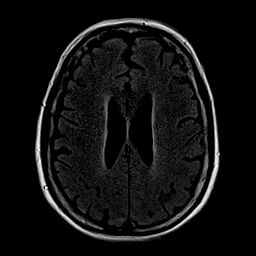
[im 49/49]
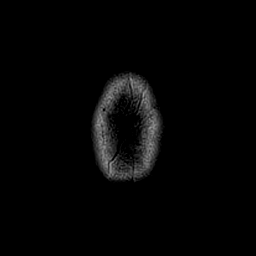

[Series 12: T1 post-contrast · axial · 2.0mm · 0.39mm/px · z∈[-145,-55]mm · 4 of 91 slices shown]
[im 1/91]
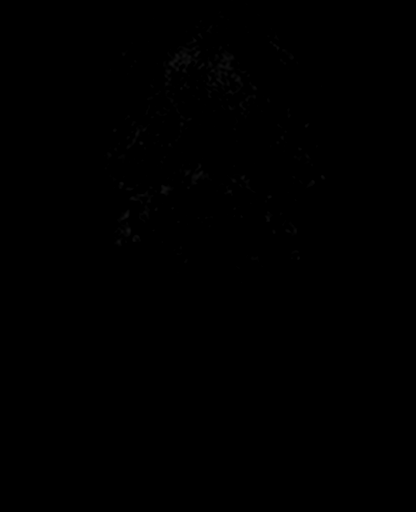
[im 16/91]
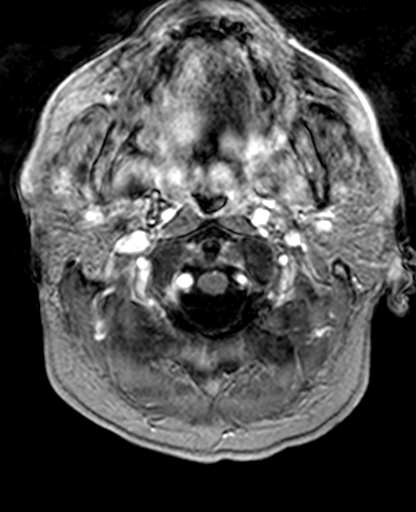
[im 31/91]
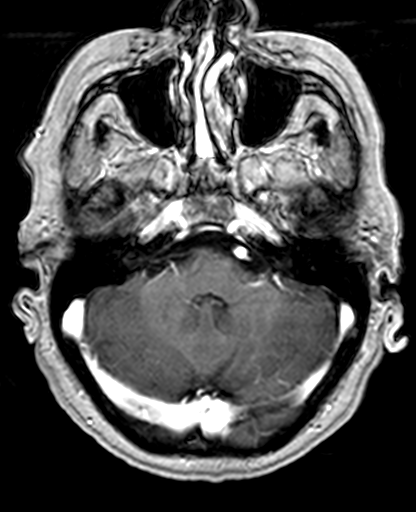
[im 46/91]
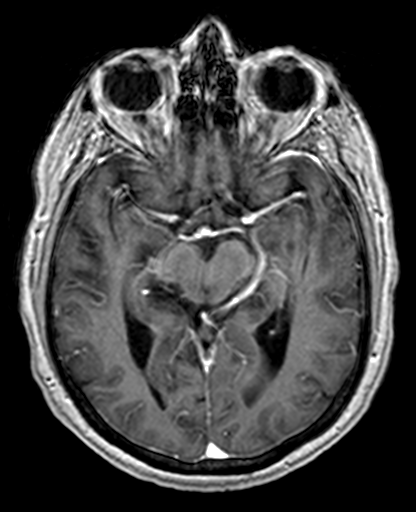

[Series 15: FLAIR · coronal · 3.0mm · 0.22mm/px · 3 of 36 slices shown (2 of 2)]
[im 1/36]
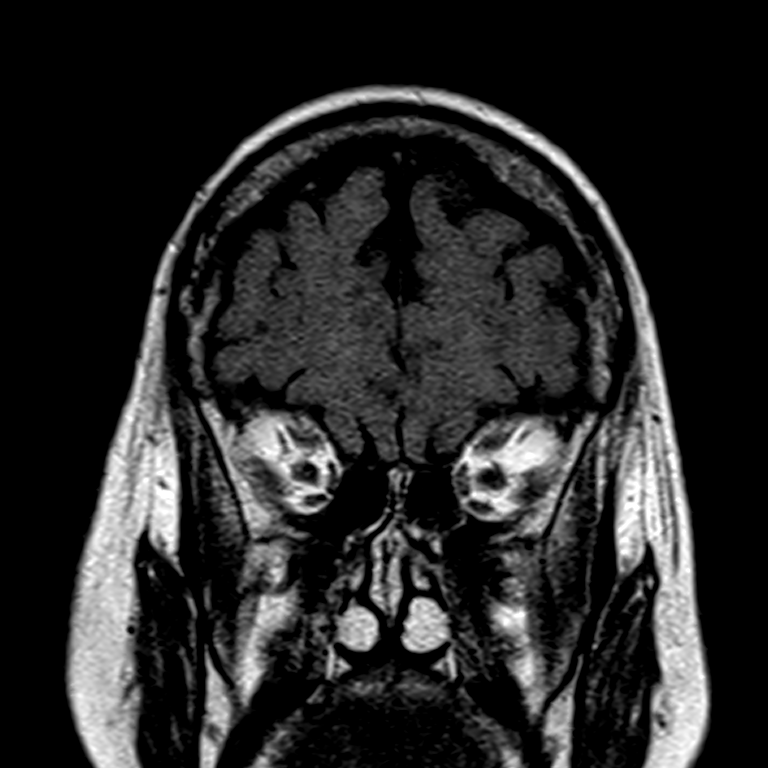
[im 18/36]
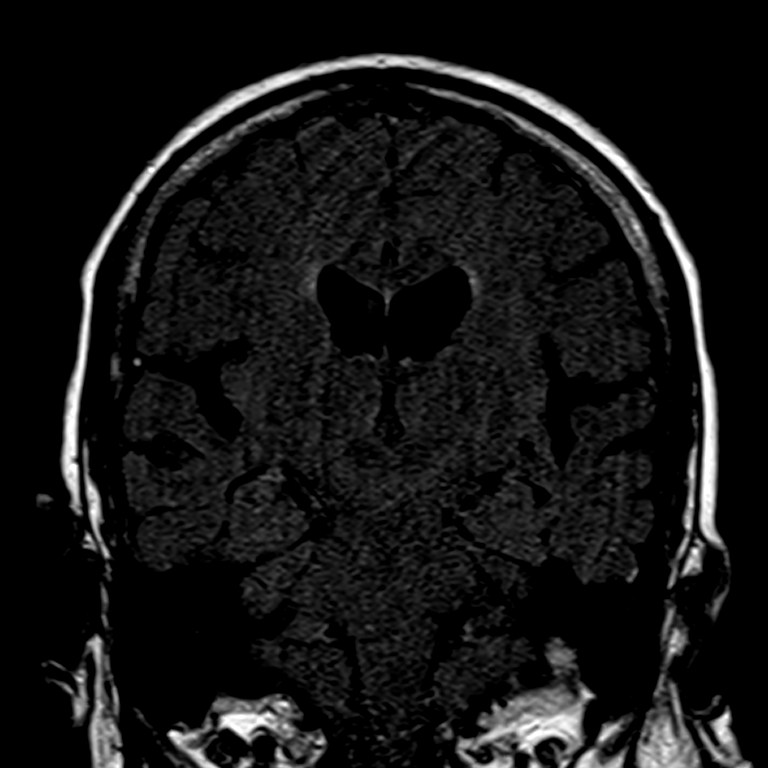
[im 36/36]
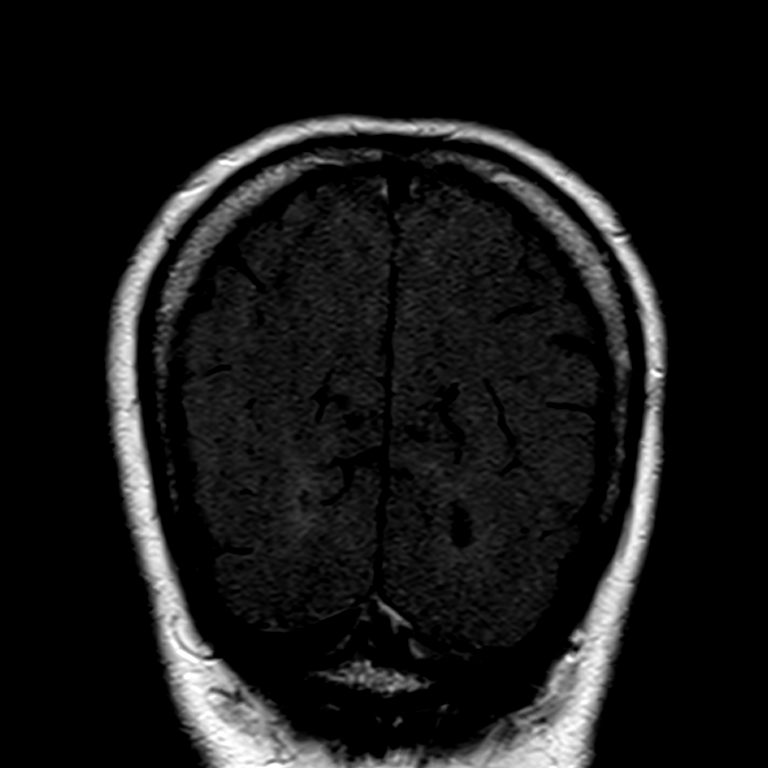

[25 of 48 positions shown; findings below may reference images not displayed]

FINDINGS: MRI HEAD FINDINGS

Brain: Cerebral volume within normal limits for patient age. Mild
hazy T2/FLAIR hyperintensity seen within the periventricular white
matter, nonspecific, but felt to be within normal limits for age.

No abnormal foci of restricted diffusion to suggest acute or
subacute ischemia. Gray-white matter differentiation well
maintained. No encephalomalacia to suggest chronic infarction. No
foci of susceptibility artifact to suggest acute or chronic
intracranial hemorrhage.

No mass lesion, midline shift or mass effect. No hydrocephalus. No
extra-axial fluid collection. Major dural sinuses are grossly
patent.

Pituitary gland and suprasellar region are normal. Midline
structures intact and normal.

No abnormal enhancement.  No intrinsic temporal lobe abnormality.

Vascular: Major intracranial vascular flow voids well maintained and
normal in appearance.

Skull and upper cervical spine: Craniocervical junction normal.
Visualized upper cervical spine within normal limits. Bone marrow
signal intensity normal. No scalp soft tissue abnormality.

Sinuses/Orbits: Globes and orbital soft tissues within normal
limits.

Paranasal sinuses are clear. No mastoid effusion. Inner ear
structures normal.

Other: None.

MRA HEAD FINDINGS

ANTERIOR CIRCULATION:

Distal cervical segments of the internal carotid arteries are patent
with symmetric antegrade flow. Petrous, cavernous, and supraclinoid
segments patent without hemodynamically significant stenosis. ICA
termini well perfused. Left A1 widely patent. Right A1 hypoplastic
but patent as well. Normal anterior communicating artery. Anterior
cerebral arteries widely patent to their distal aspects without
stenosis. M1 segments widely patent without stenosis. Normal MCA
bifurcations. Distal MCA branches well perfused and symmetric.

POSTERIOR CIRCULATION:

Vertebral arteries patent to the vertebrobasilar junction without
stenosis. Right vertebral artery dominant. Posterior inferior
cerebral arteries patent bilaterally. Basilar widely patent to its
distal aspect. Superior cerebellar and posterior cerebral arteries
well perfused bilaterally.

No intracranial aneurysm or other vascular abnormality.

MRA NECK FINDINGS

Source images reviewed. Patent antegrade flow seen within both
carotid and vertebral arteries bilaterally on time-of-flight
sequence.

Visualized aortic arch of normal caliber with normal branch pattern.
No flow-limiting stenosis seen about the origin of the great
vessels. Visualized subclavian arteries widely patent.

Right common carotid artery patent from its origin to the
bifurcation without visible stenosis. No significant atheromatous
narrowing about the right bifurcation. Right ICA widely patent to
the skull base without stenosis, occlusion, or evidence for
dissection.

Left common carotid artery patent from its origin to the bifurcation
without stenosis. No significant atheromatous narrowing about the
left bifurcation. Left ICA patent from the bifurcation to the skull
base without stenosis, occlusion, or evidence for dissection.

Both vertebral arteries arise from the subclavian arteries. Right
vertebral artery dominant. Vertebral arteries widely patent within
the neck without stenosis, dissection, or occlusion.
IMPRESSION: MRI HEAD IMPRESSION:

Normal brain MRI. No acute intracranial abnormality or findings to
explain patient's symptoms identified.

MRA HEAD IMPRESSION:

Normal intracranial MRA.

MRA NECK IMPRESSION:

Normal MRA of the neck.

## 2018-09-23 MED ORDER — LEVETIRACETAM IN NACL 1000 MG/100ML IV SOLN
1000.0000 mg | Freq: Once | INTRAVENOUS | Status: AC
  Administered 2018-09-23: 1000 mg via INTRAVENOUS
  Filled 2018-09-23: qty 100

## 2018-09-23 MED ORDER — VITAMIN D 25 MCG (1000 UNIT) PO TABS
1000.0000 [IU] | ORAL_TABLET | Freq: Every day | ORAL | Status: DC
  Administered 2018-09-24: 1000 [IU] via ORAL
  Filled 2018-09-23: qty 1

## 2018-09-23 MED ORDER — ACETAMINOPHEN 325 MG PO TABS: 650.0000 mg | ORAL_TABLET | Freq: Four times a day (QID) | ORAL | Status: DC | PRN

## 2018-09-23 MED ORDER — LEVETIRACETAM 500 MG PO TABS: 500.0000 mg | ORAL_TABLET | Freq: Two times a day (BID) | ORAL | Status: DC

## 2018-09-23 MED ORDER — LEVETIRACETAM 500 MG PO TABS
1000.0000 mg | ORAL_TABLET | Freq: Two times a day (BID) | ORAL | Status: DC
  Administered 2018-09-23 – 2018-09-24 (×2): 1000 mg via ORAL
  Filled 2018-09-23 (×2): qty 2

## 2018-09-23 MED ORDER — PANTOPRAZOLE SODIUM 40 MG PO TBEC
40.0000 mg | DELAYED_RELEASE_TABLET | Freq: Every day | ORAL | Status: DC
  Administered 2018-09-24: 40 mg via ORAL
  Filled 2018-09-23: qty 1

## 2018-09-23 MED ORDER — CARVEDILOL 12.5 MG PO TABS
12.5000 mg | ORAL_TABLET | Freq: Two times a day (BID) | ORAL | Status: DC
  Administered 2018-09-23 – 2018-09-24 (×2): 12.5 mg via ORAL
  Filled 2018-09-23 (×2): qty 1

## 2018-09-23 MED ORDER — PRAVASTATIN SODIUM 10 MG PO TABS
10.0000 mg | ORAL_TABLET | Freq: Every day | ORAL | Status: DC
  Administered 2018-09-23: 10 mg via ORAL
  Filled 2018-09-23: qty 1

## 2018-09-23 MED ORDER — INSULIN ASPART 100 UNIT/ML ~~LOC~~ SOLN
0.0000 [IU] | Freq: Three times a day (TID) | SUBCUTANEOUS | Status: DC
  Administered 2018-09-24: 3 [IU] via SUBCUTANEOUS

## 2018-09-23 MED ORDER — LEVOTHYROXINE SODIUM 75 MCG PO TABS
75.0000 ug | ORAL_TABLET | Freq: Every day | ORAL | Status: DC
  Administered 2018-09-24: 75 ug via ORAL
  Filled 2018-09-23: qty 1

## 2018-09-23 MED ORDER — HEPARIN SODIUM (PORCINE) 5000 UNIT/ML IJ SOLN
5000.0000 [IU] | Freq: Three times a day (TID) | INTRAMUSCULAR | Status: DC
  Administered 2018-09-23 – 2018-09-24 (×2): 5000 [IU] via SUBCUTANEOUS
  Filled 2018-09-23 (×2): qty 1

## 2018-09-23 MED ORDER — ACETAMINOPHEN 650 MG RE SUPP: 650.0000 mg | Freq: Four times a day (QID) | RECTAL | Status: DC | PRN

## 2018-09-23 MED ORDER — ENSURE ENLIVE PO LIQD
237.0000 mL | Freq: Two times a day (BID) | ORAL | Status: DC
  Administered 2018-09-24: 237 mL via ORAL

## 2018-09-23 MED ORDER — METOCLOPRAMIDE HCL 5 MG/ML IJ SOLN
10.0000 mg | Freq: Once | INTRAMUSCULAR | Status: AC
  Administered 2018-09-23: 10 mg via INTRAVENOUS
  Filled 2018-09-23: qty 2

## 2018-09-23 MED ORDER — CITALOPRAM HYDROBROMIDE 20 MG PO TABS
40.0000 mg | ORAL_TABLET | Freq: Every day | ORAL | Status: DC
  Administered 2018-09-24: 40 mg via ORAL
  Filled 2018-09-23: qty 2

## 2018-09-23 MED ORDER — LOSARTAN POTASSIUM 50 MG PO TABS
25.0000 mg | ORAL_TABLET | Freq: Two times a day (BID) | ORAL | Status: DC
  Administered 2018-09-23 – 2018-09-24 (×2): 25 mg via ORAL
  Filled 2018-09-23 (×2): qty 1

## 2018-09-23 MED ORDER — SODIUM CHLORIDE 0.9 % IV SOLN
INTRAVENOUS | Status: DC
  Administered 2018-09-23: 19:00:00 via INTRAVENOUS

## 2018-09-23 MED ORDER — GADOBUTROL 1 MMOL/ML IV SOLN
6.0000 mL | Freq: Once | INTRAVENOUS | Status: AC | PRN
  Administered 2018-09-23: 6 mL via INTRAVENOUS

## 2018-09-23 MED ORDER — SODIUM CHLORIDE 0.9 % IV BOLUS
1000.0000 mL | Freq: Once | INTRAVENOUS | Status: AC
  Administered 2018-09-23: 1000 mL via INTRAVENOUS

## 2018-09-23 NOTE — H&P (Signed)
TRH H&P   Patient Demographics:    Lorma Passafiume, is a 68 y.o. female  MRN: [id number]   DOB - June 23, 1951  Admit Date - 09/23/2018  Outpatient Primary MD for the patient is Patient, No Pcp Per  Referring MD/NP/PA: Dr Agapito Games  Patient coming from: Home  Chief Complaint  Patient presents with  . Seizures  . Emesis      HPI:    Hamdi Defrancis  is a 68 y.o. female, with past medical history of hypertension, hyperlipidemia, diabetes mellitus, patient presents to ED secondary to seizures, [place] patient had of seizures this Monday, which she went to [facility] ED, where she was discharged on Keppra 500 mg oral ID, she was told her CT was with no acute findings, this morning was checking her email on the computer, report she felt dizzy, and passed out, ED physician discussed with the daughter, who reported the patient apparently was on the floor, and she had generalized convulsions, unknown for how long that last, patient had after the episode some headaches, and multiple vomiting episodes, or EMS were called, her blood sugar was stable, blood pressure was elevated, in ED CT head with no acute findings, by the time she received ED she was awake alert x3, cannot recall those events. - in ED CT head with no acute findings, she was loaded with 1 g of IV Keppra, no significant lab abnormalities, neurology were consulted, and they recommended admission for work-up for seizures versus convulsion with syncope.    Review of systems:    In addition to the HPI above, No Fever-chills, No Headache, No changes with Vision or hearing, No problems swallowing food or Liquids, No Chest pain, Cough or Shortness of Breath, No Abdominal pain, No Nausea or Vommitting, Bowel movements are regular, No Blood in stool or Urine, No dysuria, No new skin rashes or bruises, No new joints  pains-aches,  No new weakness, tingling, numbness in any extremity, she had episode of seizures  No recent weight gain or loss, No polyuria, polydypsia or polyphagia, No significant Mental Stressors.  A full 10 point Review of Systems was done, except as stated above, all other Review of Systems were negative.   With Past History of the following :    Past Medical History:  Diagnosis Date  . Diabetes mellitus without complication (HCC)   . Gastritis   . Thyroid disease       Past Surgical History:  Procedure Laterality Date  . EYE SURGERY    . OTHER SURGICAL HISTORY     shoulder surgery  . THYROID SURGERY        Social History:     Social History   Tobacco Use  . Smoking status: Never Smoker  . Smokeless tobacco: Never Used  Substance Use Topics  . Alcohol use: Never    Frequency: Never     Lives  at home, independent   Family History :   Family history of seizures or stroke, she reports family history of hypertension and diabetes for her father   Home Medications:   Prior to Admission medications   Medication Sig Start Date End Date Taking? Authorizing Provider  carvedilol (COREG) 12.5 MG tablet Take 12.5 mg by mouth 2 (two) times daily with a meal.   Yes [provider]  cholecalciferol (VITAMIN D3) 25 MCG (1000 UT) tablet Take 1,000 Units by mouth daily.   Yes [provider]  citalopram (CELEXA) 40 MG tablet Take 40 mg by mouth daily.   Yes [provider]  levETIRAcetam (KEPPRA) 500 MG tablet Take 500 mg by mouth 2 (two) times daily.   Yes [provider]  levothyroxine (SYNTHROID, LEVOTHROID) 75 MCG tablet Take 75 mcg by mouth daily before breakfast.   Yes [provider]  losartan (COZAAR) 25 MG tablet Take 25 mg by mouth 2 (two) times daily.   Yes [provider]  lovastatin (MEVACOR) 20 MG tablet Take 20 mg by mouth at bedtime.   Yes [provider]  metFORMIN (GLUCOPHAGE) 1000 MG tablet  Take 1,000 mg by mouth 2 (two) times daily with a meal.   Yes [provider]  pantoprazole (PROTONIX) 40 MG tablet Take 40 mg by mouth daily.   Yes [provider]     Allergies:     Allergies  Allergen Reactions  . Penicillins      Physical Exam:   Vitals  Blood pressure (!) 141/87, pulse 66, temperature 98.8 F (37.1 C), temperature source Oral, resp. rate 15, SpO2 97 %.   1. General elderly female, laying in bed in no apparent distress  2. Normal affect and insight, Not Suicidal or Homicidal, Awake Alert, Oriented X 3.  3. No F.N deficits, ALL C.Nerves Intact, Strength 5/5 all 4 extremities, Sensation intact all 4 extremities, Plantars down going.  4. Ears and Eyes appear Normal, Conjunctivae clear, PERRLA. Moist Oral Mucosa.  5. Supple Neck, No JVD, No cervical lymphadenopathy appriciated, No Carotid Bruits.  6. Symmetrical Chest wall movement, Good air movement bilaterally, CTAB.  7. RRR, No Gallops, Rubs or Murmurs, No Parasternal Heave.  8. Positive Bowel Sounds, Abdomen Soft, No tenderness, No organomegaly appriciated,No rebound -guarding or rigidity.  9.  No Cyanosis, Normal Skin Turgor, No Skin Rash or Bruise.  10. Good muscle tone,  joints appear normal , no effusions, Normal ROM.  11. No Palpable Lymph Nodes in Neck or Axillae    Data Review:    CBC Recent Labs  Lab 09/23/18 1223  WBC 7.2  HGB 13.3  HCT 39.0  PLT 270  MCV 88.4  MCH 30.2  MCHC 34.1  RDW 12.3  LYMPHSABS 1.4  MONOABS 0.3  EOSABS 0.1  BASOSABS 0.0   ------------------------------------------------------------------------------------------------------------------  Chemistries  Recent Labs  Lab 09/23/18 1223  NA 134*  K 4.1  CL 101  CO2 23  GLUCOSE 165*  BUN 9  CREATININE 0.56  CALCIUM 8.4*  AST 16  ALT 12  ALKPHOS 76  BILITOT 0.5    ------------------------------------------------------------------------------------------------------------------ CrCl cannot be calculated (Unknown ideal weight.). ------------------------------------------------------------------------------------------------------------------ No results for input(s): TSH, T4TOTAL, T3FREE, THYROIDAB in the last 72 hours.  Invalid input(s): FREET3  Coagulation profile No results for input(s): INR, PROTIME in the last 168 hours. ------------------------------------------------------------------------------------------------------------------- No results for input(s): DDIMER in the last 72 hours. -------------------------------------------------------------------------------------------------------------------  Cardiac Enzymes Recent Labs  Lab 09/23/18 1223  TROPONINI <0.03   ------------------------------------------------------------------------------------------------------------------  No results found for: BNP   ---------------------------------------------------------------------------------------------------------------  Urinalysis    Component Value Date/Time   COLORURINE YELLOW 09/23/2018 1223   APPEARANCEUR CLEAR 09/23/2018 1223   LABSPEC 1.010 09/23/2018 1223   PHURINE 7.0 09/23/2018 1223   GLUCOSEU >=500 (A) 09/23/2018 1223   HGBUR NEGATIVE 09/23/2018 1223   BILIRUBINUR NEGATIVE 09/23/2018 1223   KETONESUR NEGATIVE 09/23/2018 1223   PROTEINUR NEGATIVE 09/23/2018 1223   NITRITE NEGATIVE 09/23/2018 1223   LEUKOCYTESUR NEGATIVE 09/23/2018 1223    ----------------------------------------------------------------------------------------------------------------   Imaging Results:    Dg Chest 2 View  Result Date: 09/23/2018 CLINICAL DATA:  Nausea and vomiting EXAM: CHEST - 2 VIEW COMPARISON:  None. FINDINGS: The heart size and mediastinal contours are within normal limits. Both lungs are clear. The visualized skeletal structures  are unremarkable. IMPRESSION: No active cardiopulmonary disease. Electronically Signed   By: Alcide Clever M.D.   On: 09/23/2018 13:19   Ct Head Wo Contrast  Result Date: 09/23/2018 CLINICAL DATA:  Altered level of consciousness EXAM: CT HEAD WITHOUT CONTRAST TECHNIQUE: Contiguous axial images were obtained from the base of the skull through the vertex without intravenous contrast. COMPARISON:  None. FINDINGS: Brain: No evidence of acute infarction, hemorrhage, extra-axial collection, ventriculomegaly, or mass effect. Generalized cerebral atrophy. Periventricular white matter low attenuation likely secondary to microangiopathy. Vascular: Cerebrovascular atherosclerotic calcifications are noted. Skull: Negative for fracture or focal lesion. Sinuses/Orbits: Visualized portions of the orbits are unremarkable. Visualized portions of the paranasal sinuses and mastoid air cells are unremarkable. Other: None. IMPRESSION: No acute intracranial pathology. Electronically Signed   By: Elige Ko   On: 09/23/2018 13:29    My personal review of EKG: Rhythm NSR, Rate  83 /min, QTc 500, right bundle branch block, no Acute ST changes   Assessment & Plan:    Active Problems:   Seizure (HCC)   Hypothyroidism   Diabetes mellitus (HCC)   Essential hypertension   Seizures -Patient presents with episode of seizures, tele-neurology input greatly appreciated, seizures versus convulsive syncope, consult neurology the recommendation, will obtain MRI brain, MRA head and neck, 2D echo, will monitor on telemetry, will check orthostatics to evaluate for possible syncope, obtain EEG, continue with Keppra at 500 mg twice daily, she already received 1 g IV in ED will await further recommendation from neurology, will keep on seizure precaution.  Hypertension -Continue with home meds  Hyperlipidemia -Continue with home meds  Diabetes mellitus -Hold metformin and continue with insulin sliding scale during hospital  stay  Hypothyroidism -Continue with Synthroid  DVT Prophylaxis Heparin - SCDs  AM Labs Ordered, also please review Full Orders  Family Communication: Admission, patients condition and plan of care including tests being ordered have been discussed with the patient  who indicate understanding and agree with the plan and Code Status.  Code Status Full  Likely DC to  Home  Condition GUARDED    Consults called: Neurology  Admission status: OBS  Time spent in minutes : 55 MINUTES   Huey Bienenstock M.D on 09/23/2018 at 3:42 PM  Between 7am to 7pm - Pager - [phone]. After 7pm go to [email or web address] - password [facility]  [facility] - Office  [phone]
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
## 2018-09-23 NOTE — ED Triage Notes (Signed)
EMS reports pt went to [facility] a few days ago and was diagnosed with seizures.  Reports was put on keppra.  EMS reports today pt was c/o headache and had multiple seizures witnessed by family.  EMS reports pt has been vomiting continuously for the past hour.  CBG 208.  BP 190 systolic.  EMS gave 4mg  zofran pta.    Pt alert and oriented with ems.  Reports was lethargic upon arrival but has improved with transport.

## 2018-09-23 NOTE — Consult Note (Signed)
[facility] Tele[facility]urology Consult Services   Stat Neurology Consult   Chief Complaint: Recurrent Convulsions   HPI: Asked to see this patient in emergent telemedicine consultation utilizing interactive audio and video technologies. ?Consultation was performed with assistance of ancillary / medical staff at bedside.   Verbal consent to perform the examination with telemedicine was obtained. Patient agreed to proceed with the consultation.  68 year old right-handed white female who comes to the emergency room today for recurrent seizure-like activity.  Case was also discussed with the ER attending.  Patient states that sometime on Monday, she recalled being outside cleaning a well hole with her landlord.  She was in the process of sweeping when she acutely felt dizzy.  She felt off balance and lightheaded.  She sat down and then passed out.  The next thing she knew, she woke up in the emergency room.  She states that her grandson was with her in the ER.  Patient was cared for at [facility] ER.  She was not admitted to the hospital but was discharged with Keppra 500 mg twice a day.  Then this morning, the patient got up and went to go check her email in front of the computer.  As she was sitting there, she again felt acutely dizzy and passed out.  ER attending had spoken with the daughter, and the patient apparently went to the floor and had a generalized convulsion.  It is unknown how long this lasted.  Patient had after the episode some headaches and multiple vomiting episodes.  EMS was called.  Blood sugar was stable.  She was hypertensive.  By the time she came to the ER, she was back to her neurological baseline and is alert and oriented x3.  Patient cannot recall what happened.  Patient states she has never had a stroke or seizure before.   PMH: Hypertension, hyperlipidemia, and diabetes mellitus   SOC: Negative x3.  Patient is a widower and typically lives alone.   FMH: Negative for  seizures.   ROS: 13 point review systems were reviewed with the patient, and are all negative with the exception of the aforementioned in the history of present illness.   VS: Temperature 98.8 F, pulse 64, respiration 15, blood pressure 139/83, oxygen saturation 97%   Exam: Patient is in no apparent distress.  Patient appears as stated age.  No obvious acute respiratory or cardiac distress.  Patient is well groomed and well-nourished. 1a- LOC: Keenly responsive - 0     1b- LOC questions: Answers both questions correctly - 0    1c- LOC commands- Performs both tasks correctly- 0    2- Gaze: Normal; no gaze paresis or gaze deviation - 0    3- Visual Fields: normal, no Visual field deficit - 0    4- Facial movements: no facial palsy - 0    5- Upper limb motor - no drift - 0    6- Lower limb motor - no drift - 0     7- Limb Coordination: absent ataxia - 0     8- Sensory: no sensory loss - 0     9- Language - No aphasia - 0     10- Speech - No dysarthria -0    11- Neglect / Extinction - none found - 0   NIHSS score: 0    Diagnostic Data: CT of the head showed no acute intracranial process Chest x-ray showed no acute cardiopulmonary disease  WBC 7.2, hemoglobin 13.3, platelets 270, urinalysis negative  for nitrites or leukoesterase, troponin negative x1, sodium 134, potassium 4.1, BUN 9, creatinine 0.56, blood glucose 165, LFTs within normal limits  Medical Data Reviewed:   1.Data?reviewed include clinical labs, radiology,?and medical tests;   2.Tests?results discussed w/performing or interpreting physician;   3.Obtaining/reviewing old medical records;   4.Obtaining?case history from another source;   5.Independent?review of image, tracing, or specimen.    Medical Decision Making:   - Extensive number of diagnosis or management options are considered below.   - Extensive amount of complex data reviewed.   - High risk of complication and/or morbidity or mortality are associated with  differential diagnostic considerations below.   - There may be?uncertain?outcome and increased probability of prolonged functional impairment or high probability of severe prolonged functional impairment associated with some of these differential diagnosis.     Assessment: 1.  Convulsions with loss of consciousness.  Differential diagnosis includes convulsive syncope versus seizures. 2.  Hypertension 3.  Hyperlipidemia 4.  Diabetes mellitus  Recommendations: Patient can be admitted to the hospital for further work-up of her symptoms. Cardiac and syncope work-up per primary team. Consult local neurology team to assist with the evaluation and management. Check MRI brain with and without contrast to rule out any acute intracranial process. Check MRA of the head and neck to evaluate her intracranial and extracranial blood vessels, with particular attention to the posterior circulation. Check echocardiogram to gauge her cardiac function. Maintain the patient on telemetry to look for any paroxysmal cardiac arrhythmias that could have caused convulsive syncope. Check orthostatic vital signs. Check an EEG to rule out subclinical seizures. Can maintain the patient on her usual dose of Keppra for now at 500 mg twice a day until more information can be obtained. Continue supportive care. Plan of care was discussed with the patient.   Thank you for allowing [facility] to participate in the care of your patient. Please call me, Dr. Adrienne Mocha, with any questions at [phone]. Case discussed with the ER staff and Dr. Estell Harpin.

## 2018-09-23 NOTE — ED Provider Notes (Signed)
[facility] EMERGENCY DEPARTMENT Provider Note   CSN: [id number] Arrival date & time: 09/23/18  1130    History   Chief Complaint Chief Complaint  Patient presents with  . Seizures  . Emesis    HPI Dawn Anthony is a 68 y.o. female.     According to the patient's mother and the patient, she had a seizure on Monday and was seen and in the hospital.  Patient was placed on Keppra 500 mg twice a day.  Daughter states that today she states she felt little dizzy she went to the floor had no seizure.  Patient does not have a history of seizures other than the 2 days ago  The history is provided by the patient. No language interpreter was used.  Seizures  Seizure activity on arrival: no   Seizure type:  Grand mal Preceding symptoms: dizziness   Initial focality:  None Episode characteristics: abnormal movements   Postictal symptoms: confusion   Return to baseline: yes   Severity:  Moderate Timing:  Once Progression:  Resolved Context: not alcohol withdrawal   Recent head injury:  No recent head injuries PTA treatment:  None Emesis  Associated symptoms: no abdominal pain, no cough, no diarrhea and no headaches     Past Medical History:  Diagnosis Date  . Diabetes mellitus without complication (HCC)   . Gastritis   . Thyroid disease     There are no active problems to display for this patient.   Past Surgical History:  Procedure Laterality Date  . EYE SURGERY    . OTHER SURGICAL HISTORY     shoulder surgery  . THYROID SURGERY       OB History   No obstetric history on file.      Home Medications    Prior to Admission medications   Medication Sig Start Date End Date Taking? Authorizing Provider  carvedilol (COREG) 12.5 MG tablet Take 12.5 mg by mouth 2 (two) times daily with a meal.   Yes [provider]  cholecalciferol (VITAMIN D3) 25 MCG (1000 UT) tablet Take 1,000 Units by mouth daily.   Yes [provider]  citalopram (CELEXA) 40  MG tablet Take 40 mg by mouth daily.   Yes [provider]  levETIRAcetam (KEPPRA) 500 MG tablet Take 500 mg by mouth 2 (two) times daily.   Yes [provider]  levothyroxine (SYNTHROID, LEVOTHROID) 75 MCG tablet Take 75 mcg by mouth daily before breakfast.   Yes [provider]  losartan (COZAAR) 25 MG tablet Take 25 mg by mouth 2 (two) times daily.   Yes [provider]  lovastatin (MEVACOR) 20 MG tablet Take 20 mg by mouth at bedtime.   Yes [provider]  metFORMIN (GLUCOPHAGE) 1000 MG tablet Take 1,000 mg by mouth 2 (two) times daily with a meal.   Yes [provider]  pantoprazole (PROTONIX) 40 MG tablet Take 40 mg by mouth daily.   Yes [provider]    Family History No family history on file.  Social History Social History   Tobacco Use  . Smoking status: Never Smoker  . Smokeless tobacco: Never Used  Substance Use Topics  . Alcohol use: Never    Frequency: Never  . Drug use: Never     Allergies   Penicillins   Review of Systems Review of Systems  Constitutional: Negative for appetite change and fatigue.  HENT: Negative for congestion, ear discharge and sinus pressure.   Eyes: Negative for  discharge.  Respiratory: Negative for cough.   Cardiovascular: Negative for chest pain.  Gastrointestinal: Positive for vomiting. Negative for abdominal pain and diarrhea.  Genitourinary: Negative for frequency and hematuria.  Musculoskeletal: Negative for back pain.  Skin: Negative for rash.  Neurological: Positive for seizures. Negative for headaches.  Psychiatric/Behavioral: Negative for hallucinations.     Physical Exam Updated Vital Signs BP 139/83 (BP Location: Right Arm)   Pulse 64   Temp 98.8 F (37.1 C) (Oral)   Resp 15   SpO2 97%   Physical Exam Vitals signs and nursing note reviewed.  Constitutional:      Appearance: She is well-developed.  HENT:     Head: Normocephalic.  Eyes:      General: No scleral icterus.    Conjunctiva/sclera: Conjunctivae normal.  Neck:     Musculoskeletal: Neck supple.     Thyroid: No thyromegaly.  Cardiovascular:     Rate and Rhythm: Normal rate and regular rhythm.     Heart sounds: No murmur. No friction rub. No gallop.   Pulmonary:     Breath sounds: No stridor. No wheezing or rales.  Chest:     Chest wall: No tenderness.  Abdominal:     General: There is no distension.     Tenderness: There is no abdominal tenderness. There is no rebound.  Musculoskeletal: Normal range of motion.  Lymphadenopathy:     Cervical: No cervical adenopathy.  Skin:    Findings: No erythema or rash.  Neurological:     Mental Status: She is alert and oriented to person, place, and time.     Motor: No abnormal muscle tone.     Coordination: Coordination normal.  Psychiatric:        Behavior: Behavior normal.      ED Treatments / Results  Labs (all labs ordered are listed, but only abnormal results are displayed) Labs Reviewed  COMPREHENSIVE METABOLIC PANEL - Abnormal; Notable for the following components:      Result Value   Sodium 134 (*)    Glucose, Bld 165 (*)    Calcium 8.4 (*)    Total Protein 6.3 (*)    All other components within normal limits  URINALYSIS, ROUTINE W REFLEX MICROSCOPIC - Abnormal; Notable for the following components:   Glucose, UA >=500 (*)    All other components within normal limits  CBG MONITORING, ED - Abnormal; Notable for the following components:   Glucose-Capillary 188 (*)    All other components within normal limits  CBC WITH DIFFERENTIAL/PLATELET  TROPONIN I    EKG EKG Interpretation  Date/Time:  Wednesday September 23 2018 11:35:10 EDT Ventricular Rate:  83 PR Interval:    QRS Duration: 121 QT Interval:  425 QTC Calculation: 500 R Axis:   23 Text Interpretation:  Sinus rhythm Right bundle branch block Baseline wander in lead(s) V1 Confirmed by Raeford Razor [id number]) on 09/23/2018 12:31:18  PM   Radiology Dg Chest 2 View  Result Date: 09/23/2018 CLINICAL DATA:  Nausea and vomiting EXAM: CHEST - 2 VIEW COMPARISON:  None. FINDINGS: The heart size and mediastinal contours are within normal limits. Both lungs are clear. The visualized skeletal structures are unremarkable. IMPRESSION: No active cardiopulmonary disease. Electronically Signed   By: Alcide Clever M.D.   On: 09/23/2018 13:19   Ct Head Wo Contrast  Result Date: 09/23/2018 CLINICAL DATA:  Altered level of consciousness EXAM: CT HEAD WITHOUT CONTRAST TECHNIQUE: Contiguous axial images were obtained from the base of the skull through  the vertex without intravenous contrast. COMPARISON:  None. FINDINGS: Brain: No evidence of acute infarction, hemorrhage, extra-axial collection, ventriculomegaly, or mass effect. Generalized cerebral atrophy. Periventricular white matter low attenuation likely secondary to microangiopathy. Vascular: Cerebrovascular atherosclerotic calcifications are noted. Skull: Negative for fracture or focal lesion. Sinuses/Orbits: Visualized portions of the orbits are unremarkable. Visualized portions of the paranasal sinuses and mastoid air cells are unremarkable. Other: None. IMPRESSION: No acute intracranial pathology. Electronically Signed   By: Elige Ko   On: 09/23/2018 13:29    Procedures Procedures (including critical care time)  Medications Ordered in ED Medications  sodium chloride 0.9 % bolus 1,000 mL (0 mLs Intravenous Stopped 09/23/18 1325)  metoCLOPramide (REGLAN) injection 10 mg (10 mg Intravenous Given 09/23/18 1215)  levETIRAcetam (KEPPRA) IVPB 1000 mg/100 mL premix (0 mg Intravenous Stopped 09/23/18 1325)     Initial Impression / Assessment and Plan / ED Course  I have reviewed the triage vital signs and the nursing notes.  Pertinent labs & imaging results that were available during my care of the patient were reviewed by me and considered in my medical decision making (see chart for  details).   She was seen by telemetry neurology and it is felt the patient should have an MRI and EEG and admission     Patient with most likely recurrent seizures.  She will be admitted to medicine for further work-up and neurology consult Final Clinical Impressions(s) / ED Diagnoses   Final diagnoses:  None    ED Discharge Orders    None       Bethann Berkshire, MD 09/23/18 1502

## 2018-09-23 NOTE — Plan of Care (Signed)

## 2018-09-23 NOTE — Progress Notes (Signed)
*  PRELIMINARY RESULTS* Echocardiogram 2D Echocardiogram has been performed.  Stacey Drain 09/23/2018, 5:46 PM

## 2018-09-23 NOTE — ED Notes (Signed)
Dr. Estell Harpin at bedside. Daughter contacted and Dr. Estell Harpin spoke to daughter with permission of patient.

## 2018-09-24 ENCOUNTER — Observation Stay (HOSPITAL_COMMUNITY)
Admit: 2018-09-24 | Discharge: 2018-09-24 | Disposition: A | Discharge status: Home / Self Care | Payer: Medicare Other | Attending: Internal Medicine | Admitting: Internal Medicine

## 2018-09-24 DIAGNOSIS — I10 Essential (primary) hypertension: Secondary | ICD-10-CM

## 2018-09-24 DIAGNOSIS — E039 Hypothyroidism, unspecified: Secondary | ICD-10-CM | POA: Diagnosis not present

## 2018-09-24 DIAGNOSIS — E119 Type 2 diabetes mellitus without complications: Secondary | ICD-10-CM | POA: Diagnosis not present

## 2018-09-24 DIAGNOSIS — R569 Unspecified convulsions: Secondary | ICD-10-CM | POA: Diagnosis not present

## 2018-09-24 LAB — GLUCOSE, CAPILLARY
Glucose-Capillary: 109 mg/dL — ABNORMAL HIGH (ref 70–99)
Glucose-Capillary: 202 mg/dL — ABNORMAL HIGH (ref 70–99)

## 2018-09-24 LAB — CBC
HCT: 36.9 % (ref 36.0–46.0)
Hemoglobin: 12.2 g/dL (ref 12.0–15.0)
MCH: 29.6 pg (ref 26.0–34.0)
MCHC: 33.1 g/dL (ref 30.0–36.0)
MCV: 89.6 fL (ref 80.0–100.0)
Platelets: 265 10*3/uL (ref 150–400)
RBC: 4.12 MIL/uL (ref 3.87–5.11)
RDW: 12.2 % (ref 11.5–15.5)
WBC: 8.3 10*3/uL (ref 4.0–10.5)
nRBC: 0 % (ref 0.0–0.2)

## 2018-09-24 LAB — BASIC METABOLIC PANEL
Anion gap: 9 (ref 5–15)
BUN: 8 mg/dL (ref 8–23)
CO2: 26 mmol/L (ref 22–32)
Calcium: 8.8 mg/dL — ABNORMAL LOW (ref 8.9–10.3)
Chloride: 104 mmol/L (ref 98–111)
Creatinine, Ser: 0.6 mg/dL (ref 0.44–1.00)
GFR calc Af Amer: >60 mL/min (ref >60)
GFR calc non Af Amer: >60 mL/min (ref >60)
Glucose, Bld: 117 mg/dL — ABNORMAL HIGH (ref 70–99)
Potassium: 3.4 mmol/L — ABNORMAL LOW (ref 3.5–5.1)
Sodium: 139 mmol/L (ref 135–145)

## 2018-09-24 LAB — HIV ANTIBODY (ROUTINE TESTING W REFLEX): HIV Screen 4th Generation wRfx: NONREACTIVE

## 2018-09-24 MED ORDER — ENSURE ENLIVE PO LIQD: 237.0000 mL | Freq: Two times a day (BID) | ORAL | 0 refills | Status: AC

## 2018-09-24 MED ORDER — POTASSIUM CHLORIDE CRYS ER 20 MEQ PO TBCR
40.0000 meq | EXTENDED_RELEASE_TABLET | Freq: Once | ORAL | Status: AC
  Administered 2018-09-24: 40 meq via ORAL
  Filled 2018-09-24: qty 2

## 2018-09-24 MED ORDER — LEVETIRACETAM 1000 MG PO TABS: 1000.0000 mg | ORAL_TABLET | Freq: Two times a day (BID) | ORAL | 0 refills | Status: DC

## 2018-09-24 NOTE — Progress Notes (Signed)
IV removed and discharge instructions reviewed.  Follow up appointments made.  Daughter to drive patient home

## 2018-09-24 NOTE — Evaluation (Signed)
Physical Therapy Evaluation Patient Details Name: Dawn Anthony MRN: [id number] DOB: February 03, 1951 Today's Date: 09/24/2018   History of Present Illness  Dawn Anthony  is a 68 y.o. female, with past medical history of hypertension, hyperlipidemia, diabetes mellitus, patient presents to ED secondary to seizures, [place] patient had of seizures this Monday, which she went to [facility] ED, where she was discharged on Keppra 500 mg oral ID, she was told her CT was with no acute findings, this morning was checking her email on the computer, report she felt dizzy, and passed out, ED physician discussed with the daughter, who reported the patient apparently was on the floor, and she had generalized convulsions, unknown for how long that last, patient had after the episode some headaches, and multiple vomiting episodes, or EMS were called, her blood sugar was stable, blood pressure was elevated, in ED CT head with no acute findings, by the time she received ED she was awake alert x3, cannot recall those events.    Clinical Impression  Patient found awake and alert in bed. Patient demonstrated ability to perform supine to sit from flat bed independently. Patient performed sit to stand independently and ambulated 50 feet before requesting to return to her room. She stated that this was the amount she usually ambulated at home. Patient demonstrated good functional strength and safety with transfers and ambulation. Therapist recommending discharge home with intermittent assistance available by patient's daughter. Also, therapist recommends OPPT if patient's dizziness occurred again, however she denied any dizziness throughout session. Therapist is signing off with patient not requiring further skilled physical therapy while in hospital at this time.     Follow Up Recommendations Outpatient PT;Other (comment)(Recommend OPPT if patient's dizziness continues)    Equipment Recommendations  None  recommended by PT    Recommendations for Other Services       Precautions / Restrictions Precautions Precautions: Fall;Other (comment)(Seizure) Restrictions Weight Bearing Restrictions: No      Mobility  Bed Mobility Overal bed mobility: Independent             General bed mobility comments: Supine to sitting at EOB independently.   Transfers Overall transfer level: Independent Equipment used: None             General transfer comment: Patient performed sit to stand at EOB independently.   Ambulation/Gait Ambulation/Gait assistance: Independent Gait Distance (Feet): 50 Feet Assistive device: None Gait Pattern/deviations: WFL(Within Functional Limits) Gait velocity: WFL   General Gait Details: Patient ambulated without difficulties. She stated it was about how far she usually ambulates at home.   Stairs            Wheelchair Mobility    Modified Rankin (Stroke Patients Only)       Balance Overall balance assessment: Independent                                           Pertinent Vitals/Pain Pain Assessment: No/denies pain    Home Living Family/patient expects to be discharged to:: Private residence Living Arrangements: Alone Available Help at Discharge: Family;Available PRN/intermittently Type of Home: Mobile home Home Access: Stairs to enter Entrance Stairs-Rails: Can reach both;Right;Left Entrance Stairs-Number of Steps: 2 Home Layout: One level Home Equipment: None      Prior Function Level of Independence: Independent               Hand  Dominance        Extremity/Trunk Assessment   Upper Extremity Assessment Upper Extremity Assessment: Overall WFL for tasks assessed    Lower Extremity Assessment Lower Extremity Assessment: Overall WFL for tasks assessed    Cervical / Trunk Assessment Cervical / Trunk Assessment: Normal  Communication   Communication: No difficulties  Cognition  Arousal/Alertness: Awake/alert Behavior During Therapy: WFL for tasks assessed/performed Overall Cognitive Status: Within Functional Limits for tasks assessed                                        General Comments      Exercises     Assessment/Plan    PT Assessment All further PT needs can be met in the next venue of care  PT Problem List Decreased activity tolerance;Decreased balance;Decreased mobility       PT Treatment Interventions      PT Goals (Current goals can be found in the Care Plan section)  Acute Rehab PT Goals Patient Stated Goal: To return home PT Goal Formulation: With patient Time For Goal Achievement: 10/01/18 Potential to Achieve Goals: Good    Frequency     Barriers to discharge        Co-evaluation               AM-PAC PT "6 Clicks" Mobility  Outcome Measure Help needed turning from your back to your side while in a flat bed without using bedrails?: None Help needed moving from lying on your back to sitting on the side of a flat bed without using bedrails?: None Help needed moving to and from a bed to a chair (including a wheelchair)?: None Help needed standing up from a chair using your arms (e.g., wheelchair or bedside chair)?: None Help needed to walk in hospital room?: None Help needed climbing 3-5 steps with a railing? : None 6 Click Score: 24    End of Session Equipment Utilized During Treatment: Gait belt Activity Tolerance: Patient tolerated treatment well;No increased pain Patient left: in bed;with bed alarm set Nurse Communication: Mobility status PT Visit Diagnosis: History of falling (Z91.81);Other (comment)(History of dizziness)    Time: 4128-7867 PT Time Calculation (min) (ACUTE ONLY): 13 min   Charges:     PT Treatments $Therapeutic Activity: 8-22 mins       Clarene Critchley PT, DPT 10:46 AM, 09/24/18 [phone]

## 2018-09-24 NOTE — Discharge Instructions (Signed)
No driving or operating machinery for at least 6 months or until your neurologist clears you to be able to drive again  Please follow-up with neurologist to receive the results of your EEG testing.  IMPORTANT INFORMATION: PAY CLOSE ATTENTION   PHYSICIAN DISCHARGE INSTRUCTIONS  Follow with Primary care provider  Patient, No Pcp Per  and other consultants as instructed your Hospitalist Physician  SEEK MEDICAL CARE OR RETURN TO EMERGENCY ROOM IF SYMPTOMS COME BACK, WORSEN OR NEW PROBLEM DEVELOPS.   Please note: You were cared for by a hospitalist during your hospital stay. Every effort will be made to forward records to your primary care provider.  You can request that your primary care provider send for your hospital records if they have not received them.  Once you are discharged, your primary care physician will handle any further medical issues. Please note that NO REFILLS for any discharge medications will be authorized once you are discharged, as it is imperative that you return to your primary care physician (or establish a relationship with a primary care physician if you do not have one) for your post hospital discharge needs so that they can reassess your need for medications and monitor your lab values.  Please get a complete blood count and chemistry panel checked by your Primary MD at your next visit, and again as instructed by your Primary MD.  Get Medicines reviewed and adjusted: Please take all your medications with you for your next visit with your Primary MD  Laboratory/radiological data: Please request your Primary MD to go over all hospital tests and procedure/radiological results at the follow up, please ask your primary care provider to get all Hospital records sent to his/her office.  In some cases, they will be blood work, cultures and biopsy results pending at the time of your discharge. Please request that your primary care provider follow up on these results.  If you  are diabetic, please bring your blood sugar readings with you to your follow up appointment with primary care.    Please call and make your follow up appointments as soon as possible.    Also Note the following: If you experience worsening of your admission symptoms, develop shortness of breath, life threatening emergency, suicidal or homicidal thoughts you must seek medical attention immediately by calling 911 or calling your MD immediately  if symptoms less severe.  You must read complete instructions/literature along with all the possible adverse reactions/side effects for all the Medicines you take and that have been prescribed to you. Take any new Medicines after you have completely understood and accpet all the possible adverse reactions/side effects.   Do not drive when taking Pain medications or sleeping medications (Benzodiazepines)  Do not take more than prescribed Pain, Sleep and Anxiety Medications. It is not advisable to combine anxiety,sleep and pain medications without talking with your primary care practitioner  Special Instructions: If you have smoked or chewed Tobacco  in the last 2 yrs please stop smoking, stop any regular Alcohol  and or any Recreational drug use.  Wear Seat belts while driving.

## 2018-09-24 NOTE — Discharge Summary (Addendum)
Physician Discharge Summary  Dawn Anthony LKG:[id number] DOB: 07-24-1950 DOA: 09/23/2018  PCP: Patient, No Pcp Per Neurologist: Dr. Gerilyn Pilgrim  Admit date: 09/23/2018 Discharge date: 09/24/2018  Admitted From:  Home  Disposition: Home   Recommendations for Outpatient Follow-up:  1. Follow up with PCP in 1 weeks 2. Follow up with neurology in 2-4 weeks  Discharge Condition: STABLE   CODE STATUS: FULL    Brief Hospitalization Summary: Please see all hospital notes, images, labs for full details of the hospitalization. Dr. Clydene Laming HPI:  Dawn Anthony  is a 68 y.o. female, with past medical history of hypertension, hyperlipidemia, diabetes mellitus, patient presents to ED secondary to seizures, [place] patient had of seizures this Monday, which she went to [facility] ED, where she was discharged on Keppra 500 mg oral ID, she was told her CT was with no acute findings, this morning was checking her email on the computer, report she felt dizzy, and passed out, ED physician discussed with the daughter, who reported the patient apparently was on the floor, and she had generalized convulsions, unknown for how long that last, patient had after the episode some headaches, and multiple vomiting episodes, or EMS were called, her blood sugar was stable, blood pressure was elevated, in ED CT head with no acute findings, by the time she received ED she was awake alert x3, cannot recall those events.  - in ED CT head with no acute findings, she was loaded with 1 g of IV Keppra, no significant lab abnormalities, neurology were consulted, and they recommended admission for work-up for seizures versus convulsion with syncope.  The patient was admitted for observation for epilepsy.  The patient was seen by Dr. Gerilyn Pilgrim for neurology who recommended increasing the Keppra to 1000 mg twice daily.  The patient also was ordered to have a EEG study.  The patient had an MRI of the brain done with results  noted below.  The patient had no further seizure activity in the hospital.  The patient will follow up with Dr. Gerilyn Pilgrim for EEG results.  The patient was given a new prescription for Keppra 1000 mg twice daily.  Seizure precautions recommended.  The patient strongly advised to avoid driving and operating any machinery until she has been cleared by neurology that it is safe for her to her to do so.   Discharge Diagnoses:  Active Problems:   Seizure (HCC)   Hypothyroidism   Diabetes mellitus (HCC)   Essential hypertension  Discharge Instructions: Discharge Instructions    Call MD for:  extreme fatigue   Complete by:  As directed    Call MD for:  persistant dizziness or light-headedness   Complete by:  As directed    Call MD for:  persistant nausea and vomiting   Complete by:  As directed    Call MD for:  severe uncontrolled pain   Complete by:  As directed    Increase activity slowly   Complete by:  As directed      Allergies as of 09/24/2018      Reactions   Penicillins       Medication List    TAKE these medications   carvedilol 12.5 MG tablet Commonly known as:  COREG Take 12.5 mg by mouth 2 (two) times daily with a meal.   cholecalciferol 25 MCG (1000 UT) tablet Commonly known as:  VITAMIN D3 Take 1,000 Units by mouth daily.   citalopram 40 MG tablet Commonly known as:  CELEXA Take 40  mg by mouth daily.   feeding supplement (ENSURE ENLIVE) Liqd Take 237 mLs by mouth 2 (two) times daily between meals for 30 days.   levETIRAcetam 1000 MG tablet Commonly known as:  KEPPRA Take 1 tablet (1,000 mg total) by mouth 2 (two) times daily for 30 days. What changed:    medication strength  how much to take   levothyroxine 75 MCG tablet Commonly known as:  SYNTHROID, LEVOTHROID Take 75 mcg by mouth daily before breakfast.   losartan 25 MG tablet Commonly known as:  COZAAR Take 25 mg by mouth 2 (two) times daily.   lovastatin 20 MG tablet Commonly known as:   MEVACOR Take 20 mg by mouth at bedtime.   metFORMIN 1000 MG tablet Commonly known as:  GLUCOPHAGE Take 1,000 mg by mouth 2 (two) times daily with a meal.   pantoprazole 40 MG tablet Commonly known as:  PROTONIX Take 40 mg by mouth daily.      Follow-up Information    Beryle Beams, MD. Schedule an appointment as soon as possible for a visit in 2 week(s).   Specialty:  Neurology Why:  Hospital Follow Up Seizures Contact information: [address] [place] [address] [phone]          Allergies  Allergen Reactions  . Penicillins    Allergies as of 09/24/2018      Reactions   Penicillins       Medication List    TAKE these medications   carvedilol 12.5 MG tablet Commonly known as:  COREG Take 12.5 mg by mouth 2 (two) times daily with a meal.   cholecalciferol 25 MCG (1000 UT) tablet Commonly known as:  VITAMIN D3 Take 1,000 Units by mouth daily.   citalopram 40 MG tablet Commonly known as:  CELEXA Take 40 mg by mouth daily.   feeding supplement (ENSURE ENLIVE) Liqd Take 237 mLs by mouth 2 (two) times daily between meals for 30 days.   levETIRAcetam 1000 MG tablet Commonly known as:  KEPPRA Take 1 tablet (1,000 mg total) by mouth 2 (two) times daily for 30 days. What changed:    medication strength  how much to take   levothyroxine 75 MCG tablet Commonly known as:  SYNTHROID, LEVOTHROID Take 75 mcg by mouth daily before breakfast.   losartan 25 MG tablet Commonly known as:  COZAAR Take 25 mg by mouth 2 (two) times daily.   lovastatin 20 MG tablet Commonly known as:  MEVACOR Take 20 mg by mouth at bedtime.   metFORMIN 1000 MG tablet Commonly known as:  GLUCOPHAGE Take 1,000 mg by mouth 2 (two) times daily with a meal.   pantoprazole 40 MG tablet Commonly known as:  PROTONIX Take 40 mg by mouth daily.       Procedures/Studies: Dg Chest 2 View  Result Date: 09/23/2018 CLINICAL DATA:  Nausea and vomiting EXAM: CHEST - 2 VIEW  COMPARISON:  None. FINDINGS: The heart size and mediastinal contours are within normal limits. Both lungs are clear. The visualized skeletal structures are unremarkable. IMPRESSION: No active cardiopulmonary disease. Electronically Signed   By: Alcide Clever M.D.   On: 09/23/2018 13:19   Ct Head Wo Contrast  Result Date: 09/23/2018 CLINICAL DATA:  Altered level of consciousness EXAM: CT HEAD WITHOUT CONTRAST TECHNIQUE: Contiguous axial images were obtained from the base of the skull through the vertex without intravenous contrast. COMPARISON:  None. FINDINGS: Brain: No evidence of acute infarction, hemorrhage, extra-axial collection, ventriculomegaly, or mass effect. Generalized  cerebral atrophy. Periventricular white matter low attenuation likely secondary to microangiopathy. Vascular: Cerebrovascular atherosclerotic calcifications are noted. Skull: Negative for fracture or focal lesion. Sinuses/Orbits: Visualized portions of the orbits are unremarkable. Visualized portions of the paranasal sinuses and mastoid air cells are unremarkable. Other: None. IMPRESSION: No acute intracranial pathology. Electronically Signed   By: Elige Ko   On: 09/23/2018 13:29   Mr Dawn Anthony Head Wo Contrast  Result Date: 09/23/2018 CLINICAL DATA:  Initial evaluation for acute dizziness, syncope, seizures. EXAM: MRI HEAD WITHOUT AND WITH CONTRAST MRA HEAD WITHOUT CONTRAST MRA NECK WITHOUT AND WITH CONTRAST TECHNIQUE: Multiplanar, multiecho pulse sequences of the brain and surrounding structures were obtained without intravenous contrast. Angiographic images of the Circle of Willis were obtained using MRA technique without intravenous contrast. Angiographic images of the neck were obtained using MRA technique without and with intravenous contrast. Carotid stenosis measurements (when applicable) are obtained utilizing NASCET criteria, using the distal internal carotid diameter as the denominator. CONTRAST:  6 cc of Gadavist. COMPARISON:   Prior head CT from earlier the same day. FINDINGS: MRI HEAD FINDINGS Brain: Cerebral volume within normal limits for patient age. Mild hazy T2/FLAIR hyperintensity seen within the periventricular white matter, nonspecific, but felt to be within normal limits for age. No abnormal foci of restricted diffusion to suggest acute or subacute ischemia. Gray-white matter differentiation well maintained. No encephalomalacia to suggest chronic infarction. No foci of susceptibility artifact to suggest acute or chronic intracranial hemorrhage. No mass lesion, midline shift or mass effect. No hydrocephalus. No extra-axial fluid collection. Major dural sinuses are grossly patent. Pituitary gland and suprasellar region are normal. Midline structures intact and normal. No abnormal enhancement.  No intrinsic temporal lobe abnormality. Vascular: Major intracranial vascular flow voids well maintained and normal in appearance. Skull and upper cervical spine: Craniocervical junction normal. Visualized upper cervical spine within normal limits. Bone marrow signal intensity normal. No scalp soft tissue abnormality. Sinuses/Orbits: Globes and orbital soft tissues within normal limits. Paranasal sinuses are clear. No mastoid effusion. Inner ear structures normal. Other: None. MRA HEAD FINDINGS ANTERIOR CIRCULATION: Distal cervical segments of the internal carotid arteries are patent with symmetric antegrade flow. Petrous, cavernous, and supraclinoid segments patent without hemodynamically significant stenosis. ICA termini well perfused. Left A1 widely patent. Right A1 hypoplastic but patent as well. Normal anterior communicating artery. Anterior cerebral arteries widely patent to their distal aspects without stenosis. M1 segments widely patent without stenosis. Normal MCA bifurcations. Distal MCA branches well perfused and symmetric. POSTERIOR CIRCULATION: Vertebral arteries patent to the vertebrobasilar junction without stenosis. Right  vertebral artery dominant. Posterior inferior cerebral arteries patent bilaterally. Basilar widely patent to its distal aspect. Superior cerebellar and posterior cerebral arteries well perfused bilaterally. No intracranial aneurysm or other vascular abnormality. MRA NECK FINDINGS Source images reviewed. Patent antegrade flow seen within both carotid and vertebral arteries bilaterally on time-of-flight sequence. Visualized aortic arch of normal caliber with normal branch pattern. No flow-limiting stenosis seen about the origin of the great vessels. Visualized subclavian arteries widely patent. Right common carotid artery patent from its origin to the bifurcation without visible stenosis. No significant atheromatous narrowing about the right bifurcation. Right ICA widely patent to the skull base without stenosis, occlusion, or evidence for dissection. Left common carotid artery patent from its origin to the bifurcation without stenosis. No significant atheromatous narrowing about the left bifurcation. Left ICA patent from the bifurcation to the skull base without stenosis, occlusion, or evidence for dissection. Both vertebral arteries arise from the subclavian  arteries. Right vertebral artery dominant. Vertebral arteries widely patent within the neck without stenosis, dissection, or occlusion. IMPRESSION: MRI HEAD IMPRESSION: Normal brain MRI. No acute intracranial abnormality or findings to explain patient's symptoms identified. MRA HEAD IMPRESSION: Normal intracranial MRA. MRA NECK IMPRESSION: Normal MRA of the neck. Electronically Signed   By: Rise Mu M.D.   On: 09/23/2018 19:46   Mr Dawn Anthony Neck W Wo Contrast  Result Date: 09/23/2018 CLINICAL DATA:  Initial evaluation for acute dizziness, syncope, seizures. EXAM: MRI HEAD WITHOUT AND WITH CONTRAST MRA HEAD WITHOUT CONTRAST MRA NECK WITHOUT AND WITH CONTRAST TECHNIQUE: Multiplanar, multiecho pulse sequences of the brain and surrounding structures were  obtained without intravenous contrast. Angiographic images of the Circle of Willis were obtained using MRA technique without intravenous contrast. Angiographic images of the neck were obtained using MRA technique without and with intravenous contrast. Carotid stenosis measurements (when applicable) are obtained utilizing NASCET criteria, using the distal internal carotid diameter as the denominator. CONTRAST:  6 cc of Gadavist. COMPARISON:  Prior head CT from earlier the same day. FINDINGS: MRI HEAD FINDINGS Brain: Cerebral volume within normal limits for patient age. Mild hazy T2/FLAIR hyperintensity seen within the periventricular white matter, nonspecific, but felt to be within normal limits for age. No abnormal foci of restricted diffusion to suggest acute or subacute ischemia. Gray-white matter differentiation well maintained. No encephalomalacia to suggest chronic infarction. No foci of susceptibility artifact to suggest acute or chronic intracranial hemorrhage. No mass lesion, midline shift or mass effect. No hydrocephalus. No extra-axial fluid collection. Major dural sinuses are grossly patent. Pituitary gland and suprasellar region are normal. Midline structures intact and normal. No abnormal enhancement.  No intrinsic temporal lobe abnormality. Vascular: Major intracranial vascular flow voids well maintained and normal in appearance. Skull and upper cervical spine: Craniocervical junction normal. Visualized upper cervical spine within normal limits. Bone marrow signal intensity normal. No scalp soft tissue abnormality. Sinuses/Orbits: Globes and orbital soft tissues within normal limits. Paranasal sinuses are clear. No mastoid effusion. Inner ear structures normal. Other: None. MRA HEAD FINDINGS ANTERIOR CIRCULATION: Distal cervical segments of the internal carotid arteries are patent with symmetric antegrade flow. Petrous, cavernous, and supraclinoid segments patent without hemodynamically significant  stenosis. ICA termini well perfused. Left A1 widely patent. Right A1 hypoplastic but patent as well. Normal anterior communicating artery. Anterior cerebral arteries widely patent to their distal aspects without stenosis. M1 segments widely patent without stenosis. Normal MCA bifurcations. Distal MCA branches well perfused and symmetric. POSTERIOR CIRCULATION: Vertebral arteries patent to the vertebrobasilar junction without stenosis. Right vertebral artery dominant. Posterior inferior cerebral arteries patent bilaterally. Basilar widely patent to its distal aspect. Superior cerebellar and posterior cerebral arteries well perfused bilaterally. No intracranial aneurysm or other vascular abnormality. MRA NECK FINDINGS Source images reviewed. Patent antegrade flow seen within both carotid and vertebral arteries bilaterally on time-of-flight sequence. Visualized aortic arch of normal caliber with normal branch pattern. No flow-limiting stenosis seen about the origin of the great vessels. Visualized subclavian arteries widely patent. Right common carotid artery patent from its origin to the bifurcation without visible stenosis. No significant atheromatous narrowing about the right bifurcation. Right ICA widely patent to the skull base without stenosis, occlusion, or evidence for dissection. Left common carotid artery patent from its origin to the bifurcation without stenosis. No significant atheromatous narrowing about the left bifurcation. Left ICA patent from the bifurcation to the skull base without stenosis, occlusion, or evidence for dissection. Both vertebral arteries arise from the subclavian  arteries. Right vertebral artery dominant. Vertebral arteries widely patent within the neck without stenosis, dissection, or occlusion. IMPRESSION: MRI HEAD IMPRESSION: Normal brain MRI. No acute intracranial abnormality or findings to explain patient's symptoms identified. MRA HEAD IMPRESSION: Normal intracranial MRA. MRA  NECK IMPRESSION: Normal MRA of the neck. Electronically Signed   By: Rise MuBenjamin  McClintock M.D.   On: 09/23/2018 19:46   Mr Dawn JeanBrain W ZOWo Contrast  Result Date: 09/23/2018 CLINICAL DATA:  Initial evaluation for acute dizziness, syncope, seizures. EXAM: MRI HEAD WITHOUT AND WITH CONTRAST MRA HEAD WITHOUT CONTRAST MRA NECK WITHOUT AND WITH CONTRAST TECHNIQUE: Multiplanar, multiecho pulse sequences of the brain and surrounding structures were obtained without intravenous contrast. Angiographic images of the Circle of Willis were obtained using MRA technique without intravenous contrast. Angiographic images of the neck were obtained using MRA technique without and with intravenous contrast. Carotid stenosis measurements (when applicable) are obtained utilizing NASCET criteria, using the distal internal carotid diameter as the denominator. CONTRAST:  6 cc of Gadavist. COMPARISON:  Prior head CT from earlier the same day. FINDINGS: MRI HEAD FINDINGS Brain: Cerebral volume within normal limits for patient age. Mild hazy T2/FLAIR hyperintensity seen within the periventricular white matter, nonspecific, but felt to be within normal limits for age. No abnormal foci of restricted diffusion to suggest acute or subacute ischemia. Gray-white matter differentiation well maintained. No encephalomalacia to suggest chronic infarction. No foci of susceptibility artifact to suggest acute or chronic intracranial hemorrhage. No mass lesion, midline shift or mass effect. No hydrocephalus. No extra-axial fluid collection. Major dural sinuses are grossly patent. Pituitary gland and suprasellar region are normal. Midline structures intact and normal. No abnormal enhancement.  No intrinsic temporal lobe abnormality. Vascular: Major intracranial vascular flow voids well maintained and normal in appearance. Skull and upper cervical spine: Craniocervical junction normal. Visualized upper cervical spine within normal limits. Bone marrow signal  intensity normal. No scalp soft tissue abnormality. Sinuses/Orbits: Globes and orbital soft tissues within normal limits. Paranasal sinuses are clear. No mastoid effusion. Inner ear structures normal. Other: None. MRA HEAD FINDINGS ANTERIOR CIRCULATION: Distal cervical segments of the internal carotid arteries are patent with symmetric antegrade flow. Petrous, cavernous, and supraclinoid segments patent without hemodynamically significant stenosis. ICA termini well perfused. Left A1 widely patent. Right A1 hypoplastic but patent as well. Normal anterior communicating artery. Anterior cerebral arteries widely patent to their distal aspects without stenosis. M1 segments widely patent without stenosis. Normal MCA bifurcations. Distal MCA branches well perfused and symmetric. POSTERIOR CIRCULATION: Vertebral arteries patent to the vertebrobasilar junction without stenosis. Right vertebral artery dominant. Posterior inferior cerebral arteries patent bilaterally. Basilar widely patent to its distal aspect. Superior cerebellar and posterior cerebral arteries well perfused bilaterally. No intracranial aneurysm or other vascular abnormality. MRA NECK FINDINGS Source images reviewed. Patent antegrade flow seen within both carotid and vertebral arteries bilaterally on time-of-flight sequence. Visualized aortic arch of normal caliber with normal branch pattern. No flow-limiting stenosis seen about the origin of the great vessels. Visualized subclavian arteries widely patent. Right common carotid artery patent from its origin to the bifurcation without visible stenosis. No significant atheromatous narrowing about the right bifurcation. Right ICA widely patent to the skull base without stenosis, occlusion, or evidence for dissection. Left common carotid artery patent from its origin to the bifurcation without stenosis. No significant atheromatous narrowing about the left bifurcation. Left ICA patent from the bifurcation to the  skull base without stenosis, occlusion, or evidence for dissection. Both vertebral arteries arise from the subclavian  arteries. Right vertebral artery dominant. Vertebral arteries widely patent within the neck without stenosis, dissection, or occlusion. IMPRESSION: MRI HEAD IMPRESSION: Normal brain MRI. No acute intracranial abnormality or findings to explain patient's symptoms identified. MRA HEAD IMPRESSION: Normal intracranial MRA. MRA NECK IMPRESSION: Normal MRA of the neck. Electronically Signed   By: Rise Mu M.D.   On: 09/23/2018 19:46      Subjective: Patient had no further seizure activities.  Patient has no complaints.  Discharge Exam: Vitals:   09/23/18 2141 09/24/18 0538  BP:  (!) 144/83  Pulse:  68  Resp:  16  Temp:  98.3 F (36.8 C)  SpO2: 97% 96%   Vitals:   09/23/18 1814 09/23/18 2122 09/23/18 2141 09/24/18 0538  BP:  128/71  (!) 144/83  Pulse:  66  68  Resp: Temp: 98.2 F (36.8 C) 97.6 F (36.4 C)  98.3 F (36.8 C)  TempSrc:  Oral  Oral  SpO2: 96% 98% 97% 96%  Weight: 61.7 kg     Height: 5' (1.524 m)      General: Pt is alert, awake, not in acute distress Cardiovascular: RRR, S1/S2 +, no rubs, no gallops Respiratory: CTA bilaterally, no wheezing, no rhonchi Abdominal: Soft, NT, ND, bowel sounds + Extremities: no edema, no cyanosis   The results of significant diagnostics from this hospitalization (including imaging, microbiology, ancillary and laboratory) are listed below for reference.     Microbiology: No results found for this or any previous visit (from the past 240 hour(s)).   Labs: BNP (last 3 results) No results for input(s): BNP in the last 8760 hours. Basic Metabolic Panel: Recent Labs  Lab 09/23/18 1223 09/24/18 0424  NA 134* 139  K 4.1 3.4*  CL 101 104  CO2 23 26  GLUCOSE 165* 117*  BUN 9 8  CREATININE 0.56 0.60  CALCIUM 8.4* 8.8*   Liver Function Tests: Recent Labs  Lab 09/23/18 1223  AST 16  ALT 12   ALKPHOS 76  BILITOT 0.5  PROT 6.3*  ALBUMIN 3.8   No results for input(s): LIPASE, AMYLASE in the last 168 hours. No results for input(s): AMMONIA in the last 168 hours. CBC: Recent Labs  Lab 09/23/18 1223 09/24/18 0424  WBC 7.2 8.3  NEUTROABS 5.3  --   HGB 13.3 12.2  HCT 39.0 36.9  MCV 88.4 89.6  PLT 270 265   Cardiac Enzymes: Recent Labs  Lab 09/23/18 1223  TROPONINI <0.03   BNP: Invalid input(s): POCBNP CBG: Recent Labs  Lab 09/23/18 1145 09/23/18 1812 09/23/18 2124 09/24/18 0728 09/24/18 1116  GLUCAP 188* 100* 175* 109* 202*   D-Dimer No results for input(s): DDIMER in the last 72 hours. Hgb A1c No results for input(s): HGBA1C in the last 72 hours. Lipid Profile No results for input(s): CHOL, HDL, LDLCALC, TRIG, CHOLHDL, LDLDIRECT in the last 72 hours. Thyroid function studies No results for input(s): TSH, T4TOTAL, T3FREE, THYROIDAB in the last 72 hours.  Invalid input(s): FREET3 Anemia work up No results for input(s): VITAMINB12, FOLATE, FERRITIN, TIBC, IRON, RETICCTPCT in the last 72 hours. Urinalysis    Component Value Date/Time   COLORURINE YELLOW 09/23/2018 1223   APPEARANCEUR CLEAR 09/23/2018 1223   LABSPEC 1.010 09/23/2018 1223   PHURINE 7.0 09/23/2018 1223   GLUCOSEU >=500 (A) 09/23/2018 1223   HGBUR NEGATIVE 09/23/2018 1223   BILIRUBINUR NEGATIVE 09/23/2018 1223   KETONESUR NEGATIVE 09/23/2018 1223   PROTEINUR NEGATIVE 09/23/2018 1223   NITRITE  NEGATIVE 09/23/2018 1223   LEUKOCYTESUR NEGATIVE 09/23/2018 1223   Sepsis Labs Invalid input(s): PROCALCITONIN,  WBC,  LACTICIDVEN Microbiology No results found for this or any previous visit (from the past 240 hour(s)).  Time coordinating discharge:   SIGNED:  Standley Dakins, MD  [facility] 09/24/2018, 12:39 PM How to contact the [facility] Attending or Consulting provider 7A - 7P or covering provider during after hours 7P -7A, for this patient?  1. Check the care team in [facility] and  look for a) attending/consulting [facility] provider listed and b) the [facility] team listed 2. Log into [email or web address] and use [place]'s universal password to access. If you do not have the password, please contact the hospital operator. 3. Locate the [facility] provider you are looking for under [facility] and page to a number that you can be directly reached. 4. If you still have difficulty reaching the provider, please page the [facility] (Director on Call) for the Hospitalists listed on amion for assistance.

## 2018-09-24 NOTE — Progress Notes (Signed)
Has had no seizures today and has had no loose stools.  States that last bm was last night and that is was solid.  Alert and oriented x 4 and walked with no difficulty or dizziness with therapy.  EEG to be performed when techs arrive from [place].

## 2018-09-24 NOTE — Progress Notes (Signed)
EEG Complete:  Pending Results 

## 2018-09-24 NOTE — Procedures (Signed)
  [facility] Dajon Lazar A. Gerilyn Pilgrim, MD     [email or web address]           HISTORY: The patient is a 68 year old who presents with multiple seizures.  MEDICATIONS:  Current Facility-Administered Medications:  .  acetaminophen (TYLENOL) tablet 650 mg, 650 mg, Oral, Q6H PRN **OR** acetaminophen (TYLENOL) suppository 650 mg, 650 mg, Rectal, Q6H PRN, Elgergawy, Dawood S, MD .  carvedilol (COREG) tablet 12.5 mg, 12.5 mg, Oral, BID WC, Elgergawy, Leana Roe, MD, 12.5 mg at 09/24/18 5784 .  cholecalciferol (VITAMIN D3) tablet 1,000 Units, 1,000 Units, Oral, Daily, Elgergawy, Leana Roe, MD, 1,000 Units at 09/24/18 [phone] .  citalopram (CELEXA) tablet 40 mg, 40 mg, Oral, Daily, Elgergawy, Leana Roe, MD, 40 mg at 09/24/18 9528 .  feeding supplement (ENSURE ENLIVE) (ENSURE ENLIVE) liquid 237 mL, 237 mL, Oral, BID BM, Elgergawy, Leana Roe, MD, 237 mL at 09/24/18 0923 .  heparin injection 5,000 Units, 5,000 Units, Subcutaneous, Q8H, Elgergawy, Leana Roe, MD, 5,000 Units at 09/24/18 0610 .  insulin aspart (novoLOG) injection 0-9 Units, 0-9 Units, Subcutaneous, TID WC, Elgergawy, Leana Roe, MD, 3 Units at 09/24/18 1212 .  levETIRAcetam (KEPPRA) tablet 1,000 mg, 1,000 mg, Oral, BID, Gerilyn Pilgrim, Aquanetta Schwarz, MD, 1,000 mg at 09/24/18 0922 .  levothyroxine (SYNTHROID, LEVOTHROID) tablet 75 mcg, 75 mcg, Oral, Q0600, Elgergawy, Leana Roe, MD, 75 mcg at 09/24/18 0610 .  losartan (COZAAR) tablet 25 mg, 25 mg, Oral, BID, Elgergawy, Leana Roe, MD, 25 mg at 09/24/18 4132 .  pantoprazole (PROTONIX) EC tablet 40 mg, 40 mg, Oral, Daily, Elgergawy, Leana Roe, MD, 40 mg at 09/24/18 0924 .  pravastatin (PRAVACHOL) tablet 10 mg, 10 mg, Oral, q1800, Elgergawy, Leana Roe, MD, 10 mg at 09/23/18 1831     ANALYSIS: A 16 channel recording using standard 10 20 measurements is conducted for 23 minutes.  The recording is sometimes degraded by movement artifact but adequate.  There is beta background activity of 14 Hz which attenuates with eye  opening.  There is also beta activity observed in frontal areas.  There is mostly awake activity with infrequent drowsy activities noted.  Photic stimulation is carried out without abnormal changes in the background activity.  Focal or lateralized slowing is not observed.  No clear epileptiform activities are documented.   IMPRESSION: 1.  This is a normal recording of awake and drowsy states.      Akaisha Truman A. Gerilyn Pilgrim, M.D.  Diplomate, Biomedical engineer of Psychiatry and Neurology ( Neurology).

## 2019-07-06 ENCOUNTER — Emergency Department (HOSPITAL_COMMUNITY): Payer: Medicare Other

## 2019-07-06 ENCOUNTER — Emergency Department (HOSPITAL_COMMUNITY)
Admission: EM | Admit: 2019-07-06 | Discharge: 2019-07-06 | Disposition: A | Discharge status: Home / Self Care | Payer: Medicare Other | Attending: Emergency Medicine | Admitting: Emergency Medicine

## 2019-07-06 ENCOUNTER — Encounter (HOSPITAL_COMMUNITY): Payer: Self-pay | Admitting: Emergency Medicine

## 2019-07-06 ENCOUNTER — Other Ambulatory Visit: Payer: Self-pay

## 2019-07-06 DIAGNOSIS — E039 Hypothyroidism, unspecified: Secondary | ICD-10-CM | POA: Diagnosis not present

## 2019-07-06 DIAGNOSIS — E119 Type 2 diabetes mellitus without complications: Secondary | ICD-10-CM | POA: Diagnosis not present

## 2019-07-06 DIAGNOSIS — Z79899 Other long term (current) drug therapy: Secondary | ICD-10-CM | POA: Insufficient documentation

## 2019-07-06 DIAGNOSIS — R531 Weakness: Secondary | ICD-10-CM

## 2019-07-06 DIAGNOSIS — Z7984 Long term (current) use of oral hypoglycemic drugs: Secondary | ICD-10-CM | POA: Insufficient documentation

## 2019-07-06 DIAGNOSIS — I10 Essential (primary) hypertension: Secondary | ICD-10-CM | POA: Insufficient documentation

## 2019-07-06 DIAGNOSIS — J189 Pneumonia, unspecified organism: Secondary | ICD-10-CM | POA: Insufficient documentation

## 2019-07-06 DIAGNOSIS — U071 COVID-19: Secondary | ICD-10-CM | POA: Diagnosis not present

## 2019-07-06 DIAGNOSIS — R1030 Lower abdominal pain, unspecified: Secondary | ICD-10-CM | POA: Diagnosis not present

## 2019-07-06 DIAGNOSIS — R112 Nausea with vomiting, unspecified: Secondary | ICD-10-CM

## 2019-07-06 DIAGNOSIS — R197 Diarrhea, unspecified: Secondary | ICD-10-CM

## 2019-07-06 LAB — URINALYSIS, ROUTINE W REFLEX MICROSCOPIC
Bacteria, UA: NONE SEEN
Bilirubin Urine: NEGATIVE
Glucose, UA: >=500 mg/dL — AB
Hgb urine dipstick: NEGATIVE
Ketones, ur: 5 mg/dL — AB
Leukocytes,Ua: NEGATIVE
Nitrite: NEGATIVE
Protein, ur: NEGATIVE mg/dL
Specific Gravity, Urine: 1.031 — ABNORMAL HIGH (ref 1.005–1.030)
pH: 5 (ref 5.0–8.0)

## 2019-07-06 LAB — CBC WITH DIFFERENTIAL/PLATELET
Abs Immature Granulocytes: 0.03 10*3/uL (ref 0.00–0.07)
Basophils Absolute: 0 10*3/uL (ref 0.0–0.1)
Basophils Relative: 0 %
Eosinophils Absolute: 0.1 10*3/uL (ref 0.0–0.5)
Eosinophils Relative: 1 %
HCT: 39.5 % (ref 36.0–46.0)
Hemoglobin: 13.2 g/dL (ref 12.0–15.0)
Immature Granulocytes: 1 %
Lymphocytes Relative: 19 %
Lymphs Abs: 1.1 10*3/uL (ref 0.7–4.0)
MCH: 30.8 pg (ref 26.0–34.0)
MCHC: 33.4 g/dL (ref 30.0–36.0)
MCV: 92.1 fL (ref 80.0–100.0)
Monocytes Absolute: 0.4 10*3/uL (ref 0.1–1.0)
Monocytes Relative: 7 %
Neutro Abs: 4.2 10*3/uL (ref 1.7–7.7)
Neutrophils Relative %: 72 %
Platelets: 213 10*3/uL (ref 150–400)
RBC: 4.29 MIL/uL (ref 3.87–5.11)
RDW: 12.1 % (ref 11.5–15.5)
WBC: 5.8 10*3/uL (ref 4.0–10.5)
nRBC: 0 % (ref 0.0–0.2)

## 2019-07-06 LAB — COMPREHENSIVE METABOLIC PANEL
ALT: 14 U/L (ref 0–44)
AST: 20 U/L (ref 15–41)
Albumin: 3.6 g/dL (ref 3.5–5.0)
Alkaline Phosphatase: 73 U/L (ref 38–126)
Anion gap: 9 (ref 5–15)
BUN: 13 mg/dL (ref 8–23)
CO2: 26 mmol/L (ref 22–32)
Calcium: 8.3 mg/dL — ABNORMAL LOW (ref 8.9–10.3)
Chloride: 97 mmol/L — ABNORMAL LOW (ref 98–111)
Creatinine, Ser: 0.66 mg/dL (ref 0.44–1.00)
GFR calc Af Amer: >60 mL/min (ref >60)
GFR calc non Af Amer: >60 mL/min (ref >60)
Glucose, Bld: 365 mg/dL — ABNORMAL HIGH (ref 70–99)
Potassium: 4.7 mmol/L (ref 3.5–5.1)
Sodium: 132 mmol/L — ABNORMAL LOW (ref 135–145)
Total Bilirubin: 0.4 mg/dL (ref 0.3–1.2)
Total Protein: 6.7 g/dL (ref 6.5–8.1)

## 2019-07-06 LAB — CBG MONITORING, ED: Glucose-Capillary: 247 mg/dL — ABNORMAL HIGH (ref 70–99)

## 2019-07-06 LAB — LIPASE, BLOOD: Lipase: 35 U/L (ref 11–51)

## 2019-07-06 IMAGING — CT CT ABD-PELV W/ CM
3 of 5 series · 17 of 46 positions shown, 19 images · IV contrast (omnipaque)
Comparison: None.

CLINICAL DATA: Nausea, vomiting and diarrhea. Intermittent diffuse
abdominal pain with fever.

EXAM:
CT ABDOMEN AND PELVIS WITH CONTRAST
TECHNIQUE: Multidetector CT imaging of the abdomen and pelvis was performed
using the standard protocol following bolus administration of
intravenous contrast.
CONTRAST:  100mL OMNIPAQUE IOHEXOL 300 MG/ML  SOLN

[Series 2: axial st · axial · 0.73mm/px · z∈[+832,+1227]mm · 12 of 95 slices shown, 14 images]
[im 8/95  soft-tissue]
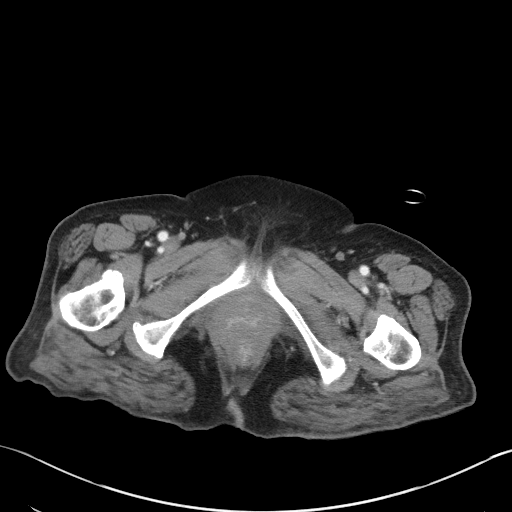
[im 8/95  bone]
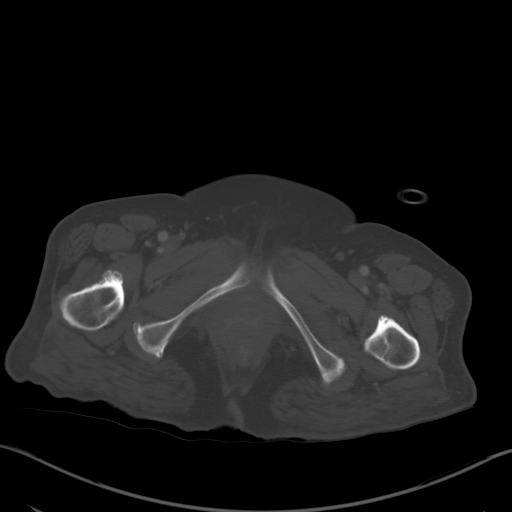
[im 15/95  soft-tissue]
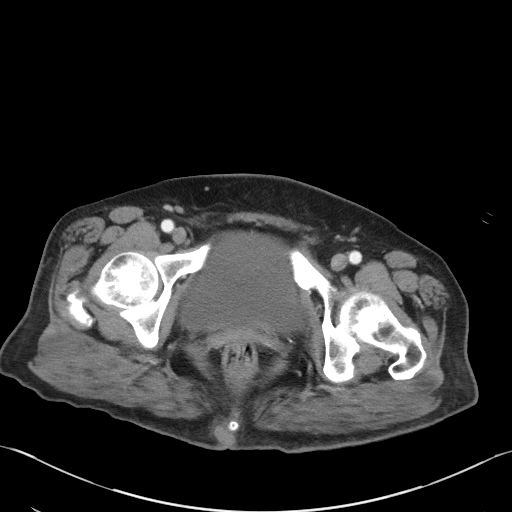
[im 22/95  soft-tissue]
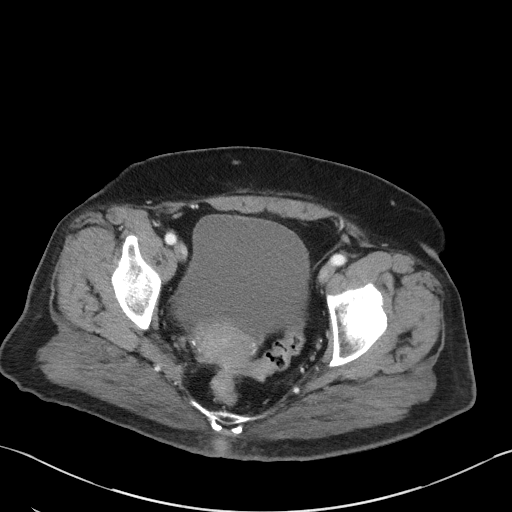
[im 29/95  soft-tissue]
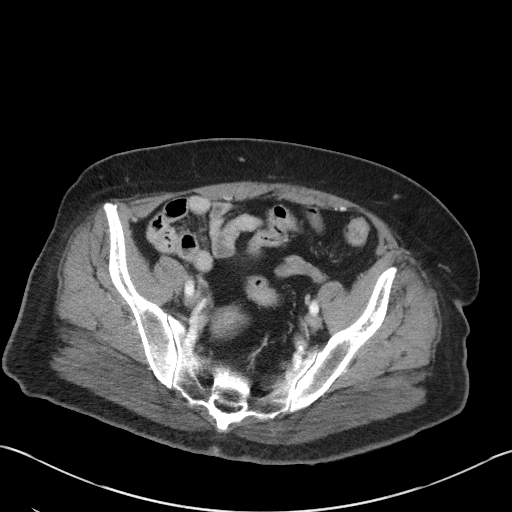
[im 37/95  soft-tissue]
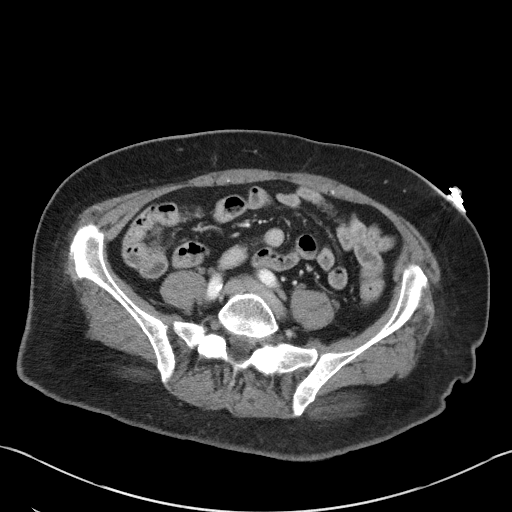
[im 44/95  soft-tissue]
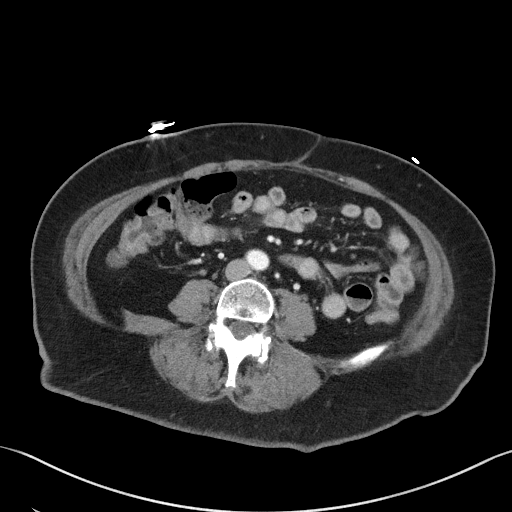
[im 51/95  soft-tissue]
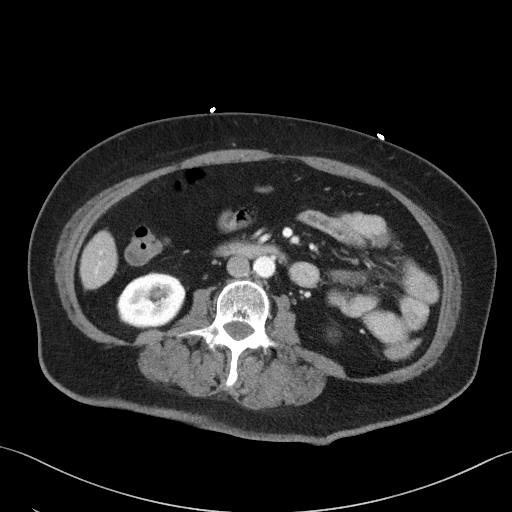
[im 58/95  soft-tissue]
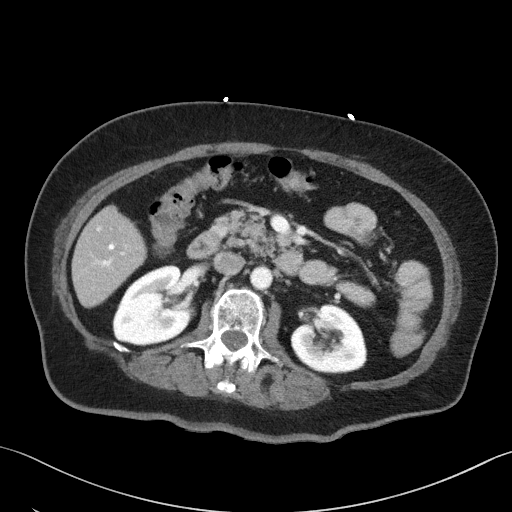
[im 66/95  soft-tissue]
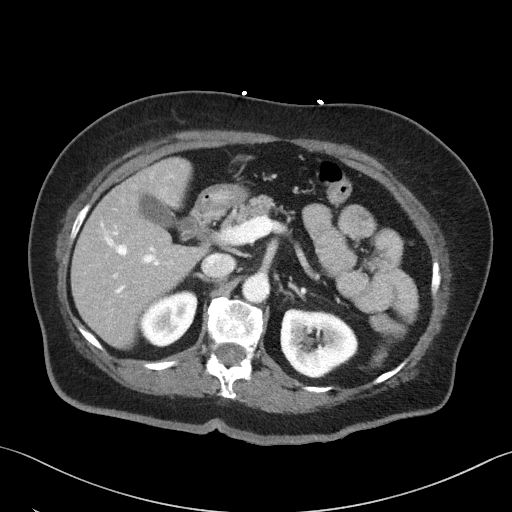
[im 66/95  bone]
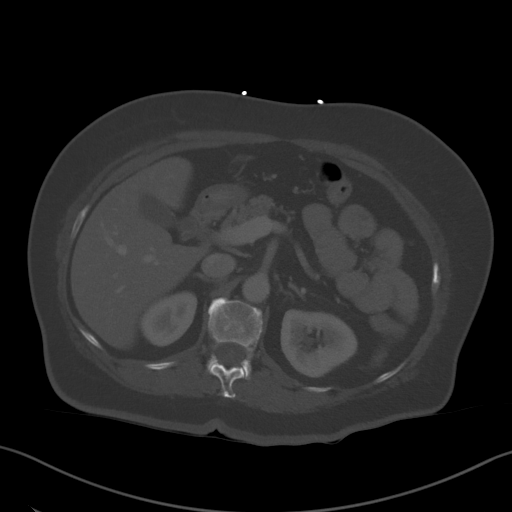
[im 73/95  soft-tissue]
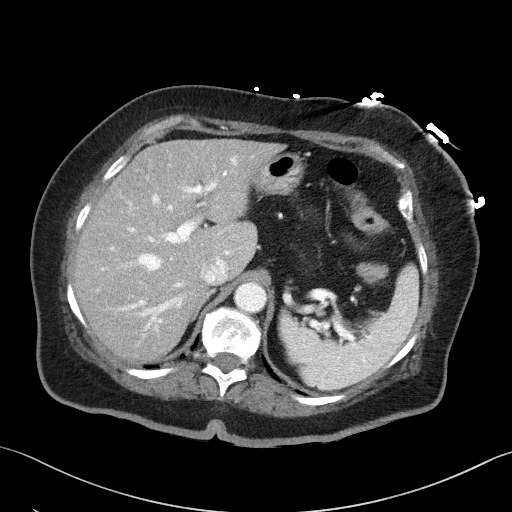
[im 80/95  soft-tissue]
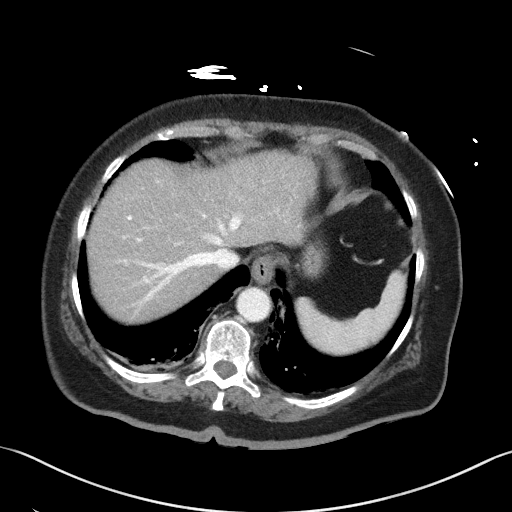
[im 87/95  soft-tissue]
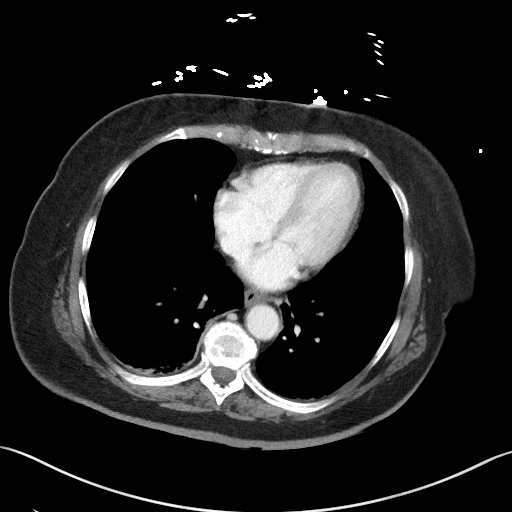

[Series 5: lung bases · axial · 0.73mm/px · z∈[+1129,+1143]mm · 2 of 76 slices shown]
[im 7/76  bone]
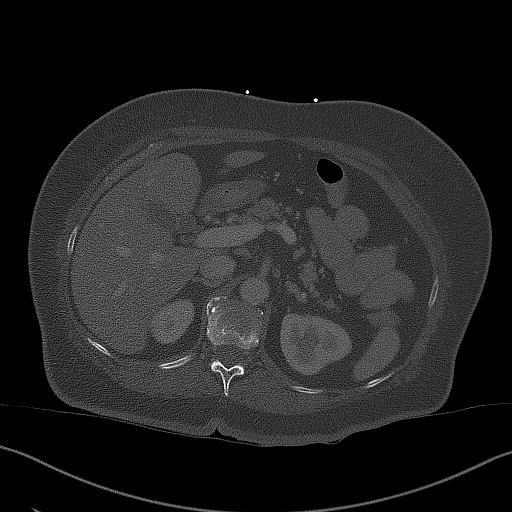
[im 14/76  bone]
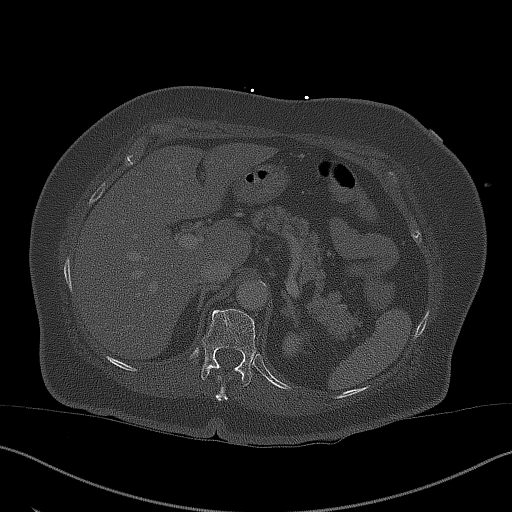

[Series 6: coronal st · coronal · 0.83mm/px · 3 of 113 slices shown]
[im 38/113  soft-tissue]
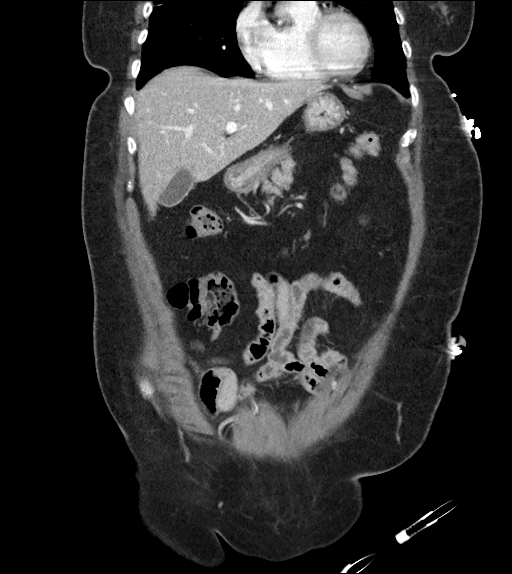
[im 50/113  soft-tissue]
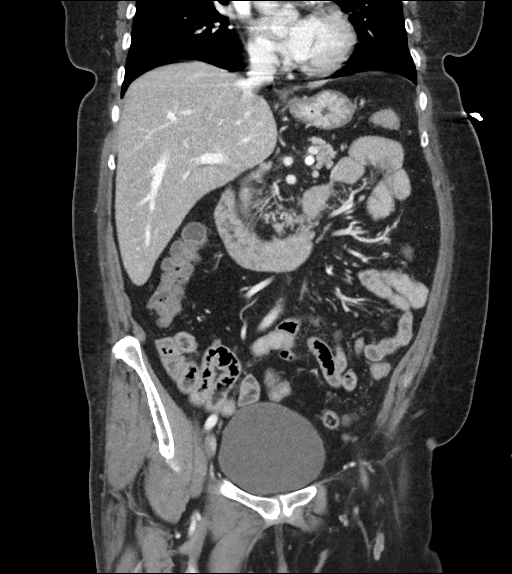
[im 63/113  soft-tissue]
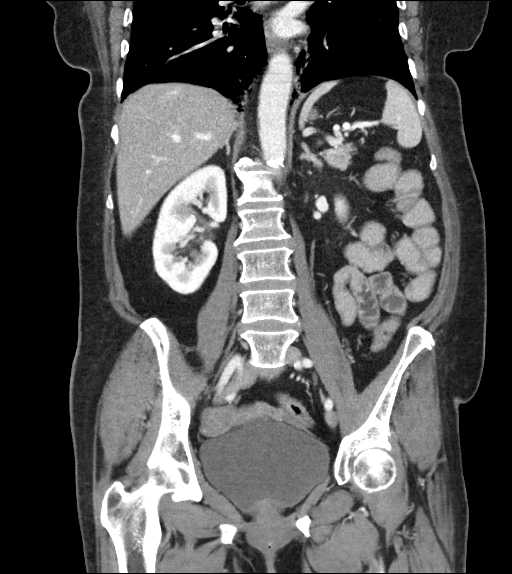

[17 of 46 positions shown; findings below may reference images not displayed]

FINDINGS: Lower chest: Mild patchy dependent pulmonary densities that could be
atelectasis or mild basilar pneumonia. Non dependent lungs are
clear. No pleural effusion.

Hepatobiliary: Diffuse fatty change of the liver. No focal lesion.
No calcified gallstones.

Pancreas: Normal

Spleen: Normal

Adrenals/Urinary Tract: Adrenal glands are normal. Kidneys are
normal. Bladder is normal.

Stomach/Bowel: No evidence obstruction or ileus. No inflammatory
changes of the bowel. No sign of diverticulosis or diverticulitis.

Vascular/Lymphatic: Aortic atherosclerosis. No aneurysm. IVC is
normal. No retroperitoneal adenopathy.

Reproductive: Normal

Other: No free fluid or air.

Musculoskeletal: Ordinary mild lower lumbar degenerative changes.
IMPRESSION: No acute finding to explain the clinical presentation. No evidence
of diverticulosis or diverticulitis.

Fatty liver.

Mild areas of patchy density in the dependent lower lungs could be
atelectasis or mild pneumonia.

## 2019-07-06 IMAGING — DX DG CHEST 1V PORT
1 series · 1 of 1 positions shown · non-contrast
Comparison: CT of the abdomen pelvis dated [DATE] and chest
radiograph dated [DATE].

CLINICAL DATA: 68-year-old female with possible pneumonia on CT.

EXAM:
PORTABLE CHEST 1 VIEW

[chest ap]
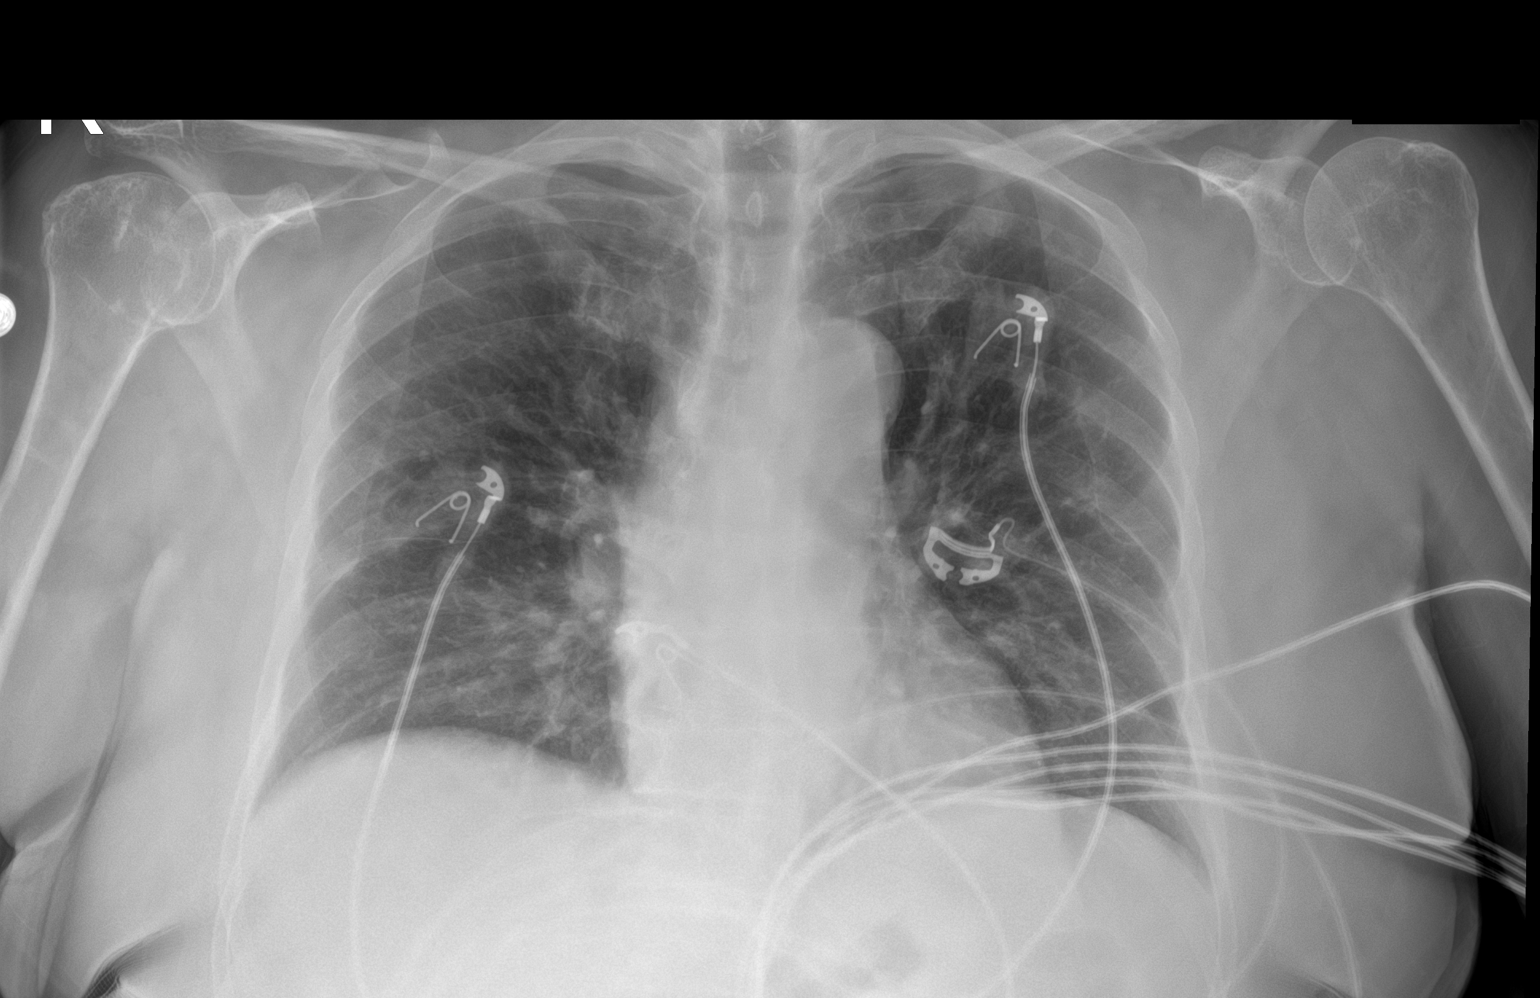

[1 of 1 positions shown; findings below may reference images not displayed]

FINDINGS: Shallow inspiration. Faint area of hazy density in the subpleural
right mid lung field may be artifactual or represent atelectasis
versus developing infiltrate. There is no focal consolidation,
pleural effusion or pneumothorax. The cardiac silhouette is within
limits. Degenerative changes of the shoulders. No acute osseous
pathology.
IMPRESSION: Small focal right mid lung field subpleural atelectasis or
developing infiltrate. No focal consolidation.

## 2019-07-06 MED ORDER — IOHEXOL 300 MG/ML  SOLN
100.0000 mL | Freq: Once | INTRAMUSCULAR | Status: AC | PRN
  Administered 2019-07-06: 16:00:00 100 mL via INTRAVENOUS

## 2019-07-06 MED ORDER — ONDANSETRON HCL 4 MG PO TABS: 4.0000 mg | ORAL_TABLET | Freq: Three times a day (TID) | ORAL | 0 refills | Status: DC | PRN

## 2019-07-06 MED ORDER — SODIUM CHLORIDE 0.9 % IV BOLUS (SEPSIS)
1000.0000 mL | Freq: Once | INTRAVENOUS | Status: AC
  Administered 2019-07-06: 1000 mL via INTRAVENOUS

## 2019-07-06 MED ORDER — AZITHROMYCIN 250 MG PO TABS
500.0000 mg | ORAL_TABLET | Freq: Once | ORAL | Status: AC
  Administered 2019-07-06: 500 mg via ORAL
  Filled 2019-07-06: qty 2

## 2019-07-06 MED ORDER — SODIUM CHLORIDE 0.9 % IV BOLUS
1000.0000 mL | Freq: Once | INTRAVENOUS | Status: AC
  Administered 2019-07-06: 13:00:00 1000 mL via INTRAVENOUS

## 2019-07-06 MED ORDER — ONDANSETRON HCL 4 MG/2ML IJ SOLN
4.0000 mg | Freq: Once | INTRAMUSCULAR | Status: AC
  Administered 2019-07-06: 13:00:00 4 mg via INTRAVENOUS
  Filled 2019-07-06: qty 2

## 2019-07-06 MED ORDER — AZITHROMYCIN 250 MG PO TABS: ORAL_TABLET | ORAL | 0 refills | Status: DC

## 2019-07-06 NOTE — ED Provider Notes (Signed)
[facility] EMERGENCY DEPARTMENT Provider Note   CSN: [id number] Arrival date & time: 07/06/19  1241     History Chief Complaint  Patient presents with  . Loss of Consciousness    Dawn Anthony is a 69 y.o. female with history of diabetes mellitus, gastritis, thyroid disease presents today for evaluation of acute onset, persistent nausea vomiting and diarrhea for 3 days.  She reports 3-4 episodes of nonbloody watery diarrhea and 2-3 episodes of nonbloody nonbilious emesis over the last 3 days.  She denies abdominal pain.  She has had low-grade fevers up to 100.2 F at home.  Notes generalized weakness and fatigue.  Earlier today was on the commode having a bowel movement when she began to feel lightheaded and the next thing she knew she was on the floor.  She denies head injury.  No headaches or vision changes.  Denies numbness or weakness of the extremities.  She is not on any anticoagulation.  She has also noted her blood sugars have been increased since yesterday.  Denies recent travel, no recent treatment with antibiotics, no suspicious food intake.  No known sick contacts, no known Covid exposures.  He has not tried anything for her symptoms.   The history is provided by the patient.       Past Medical History:  Diagnosis Date  . Diabetes mellitus without complication (HCC)   . Gastritis   . Thyroid disease     Patient Active Problem List   Diagnosis Date Noted  . Seizure (HCC) 09/23/2018  . Hypothyroidism 09/23/2018  . Diabetes mellitus (HCC) 09/23/2018  . Essential hypertension 09/23/2018    Past Surgical History:  Procedure Laterality Date  . EYE SURGERY    . OTHER SURGICAL HISTORY     shoulder surgery  . THYROID SURGERY       OB History   No obstetric history on file.     No family history on file.  Social History   Tobacco Use  . Smoking status: Never Smoker  . Smokeless tobacco: Never Used  Substance Use Topics  . Alcohol use: Never  . Drug  use: Never    Home Medications Prior to Admission medications   Medication Sig Start Date End Date Taking? Authorizing Provider  azithromycin (ZITHROMAX Z-PAK) 250 MG tablet Take one tablet daily for 4 days starting 07/07/19 07/06/19   Burgess Amor, PA-C  carvedilol (COREG) 12.5 MG tablet Take 12.5 mg by mouth 2 (two) times daily with a meal.    [provider]  cholecalciferol (VITAMIN D3) 25 MCG (1000 UT) tablet Take 1,000 Units by mouth daily.    [provider]  citalopram (CELEXA) 40 MG tablet Take 40 mg by mouth daily.    [provider]  levETIRAcetam (KEPPRA) 1000 MG tablet Take 1 tablet (1,000 mg total) by mouth 2 (two) times daily for 30 days. 09/24/18 10/24/18  Johnson, Clanford L, MD  levothyroxine (SYNTHROID, LEVOTHROID) 75 MCG tablet Take 75 mcg by mouth daily before breakfast.    [provider]  losartan (COZAAR) 25 MG tablet Take 25 mg by mouth 2 (two) times daily.    [provider]  lovastatin (MEVACOR) 20 MG tablet Take 20 mg by mouth at bedtime.    [provider]  metFORMIN (GLUCOPHAGE) 1000 MG tablet Take 1,000 mg by mouth 2 (two) times daily with a meal.    [provider]  ondansetron (ZOFRAN) 4 MG tablet Take 1 tablet (4 mg total) by mouth every  8 (eight) hours as needed for nausea or vomiting. 07/06/19   Luevenia Maxin, Arye Weyenberg A, PA-C  pantoprazole (PROTONIX) 40 MG tablet Take 40 mg by mouth daily.    [provider]    Allergies    Penicillins  Review of Systems   Review of Systems  Constitutional: Positive for fatigue and fever. Negative for chills.  Eyes: Negative for photophobia and visual disturbance.  Respiratory: Negative for cough and shortness of breath.   Cardiovascular: Negative for chest pain.  Gastrointestinal: Positive for diarrhea, nausea and vomiting. Negative for abdominal pain.  Genitourinary: Negative for dysuria, frequency, hematuria and urgency.  Neurological: Positive for syncope and  light-headedness. Negative for dizziness, numbness and headaches.  All other systems reviewed and are negative.   Physical Exam Updated Vital Signs BP (!) 167/88   Pulse 66   Temp 98.3 F (36.8 C)   Resp 12   Ht 5' (1.524 m)   Wt 61.7 kg   SpO2 99%   BMI 26.56 kg/m   Physical Exam Vitals and nursing note reviewed.  Constitutional:      General: She is not in acute distress.    Appearance: She is well-developed.  HENT:     Head: Normocephalic and atraumatic.  Eyes:     General:        Right eye: No discharge.        Left eye: No discharge.     Conjunctiva/sclera: Conjunctivae normal.  Neck:     Vascular: No JVD.     Trachea: No tracheal deviation.  Cardiovascular:     Rate and Rhythm: Normal rate and regular rhythm.  Pulmonary:     Effort: Pulmonary effort is normal.     Breath sounds: Normal breath sounds.     Comments: Speaking in full sentences without difficulty, SPO2 saturations 99% on room air.  Occasional dry cough noted during examination Abdominal:     General: Bowel sounds are decreased. There is no distension.     Palpations: Abdomen is soft.     Tenderness: There is abdominal tenderness in the right lower quadrant and suprapubic area. There is no right CVA tenderness, left CVA tenderness, guarding or rebound.  Musculoskeletal:        General: No swelling.  Skin:    General: Skin is warm and dry.     Findings: No erythema.  Neurological:     General: No focal deficit present.     Mental Status: She is alert.     Comments: Fluent speech with no dysarthria.  Speech is goal oriented.  Cranial nerves appear grossly intact.  Moves all extremities spontaneously without difficulty.  Psychiatric:        Behavior: Behavior normal.     ED Results / Procedures / Treatments   Labs (all labs ordered are listed, but only abnormal results are displayed) Labs Reviewed  SARS CORONAVIRUS 2 (TAT 6-24 HRS) - Abnormal; Notable for the following components:       Result Value   SARS Coronavirus 2 POSITIVE (*)    All other components within normal limits  COMPREHENSIVE METABOLIC PANEL - Abnormal; Notable for the following components:   Sodium 132 (*)    Chloride 97 (*)    Glucose, Bld 365 (*)    Calcium 8.3 (*)    All other components within normal limits  URINALYSIS, ROUTINE W REFLEX MICROSCOPIC - Abnormal; Notable for the following components:   Specific Gravity, Urine 1.031 (*)    Glucose, UA >=500 (*)  Ketones, ur 5 (*)    All other components within normal limits  CBG MONITORING, ED - Abnormal; Notable for the following components:   Glucose-Capillary 247 (*)    All other components within normal limits  CBC WITH DIFFERENTIAL/PLATELET  LIPASE, BLOOD    EKG EKG Interpretation  Date/Time:  Tuesday July 06 2019 12:57:09 EST Ventricular Rate:  69 PR Interval:    QRS Duration: 121 QT Interval:  426 QTC Calculation: 457 R Axis:   -48 Text Interpretation: Sinus rhythm RBBB and LAFB Confirmed by Nat Christen [phone]) on 07/06/2019 2:55:04 PM   Radiology CLINICAL DATA:  Nausea, vomiting and diarrhea. Intermittent diffuse abdominal pain with fever.  EXAM: CT ABDOMEN AND PELVIS WITH CONTRAST  TECHNIQUE: Multidetector CT imaging of the abdomen and pelvis was performed using the standard protocol following bolus administration of intravenous contrast.  CONTRAST:  175mL OMNIPAQUE IOHEXOL 300 MG/ML  SOLN  COMPARISON:  None.  FINDINGS: Lower chest: Mild patchy dependent pulmonary densities that could be atelectasis or mild basilar pneumonia. Non dependent lungs are clear. No pleural effusion.  Hepatobiliary: Diffuse fatty change of the liver. No focal lesion. No calcified gallstones.  Pancreas: Normal  Spleen: Normal  Adrenals/Urinary Tract: Adrenal glands are normal. Kidneys are normal. Bladder is normal.  Stomach/Bowel: No evidence obstruction or ileus. No inflammatory changes of the bowel. No sign of  diverticulosis or diverticulitis.  Vascular/Lymphatic: Aortic atherosclerosis. No aneurysm. IVC is normal. No retroperitoneal adenopathy.  Reproductive: Normal  Other: No free fluid or air.  Musculoskeletal: Ordinary mild lower lumbar degenerative changes.  IMPRESSION: No acute finding to explain the clinical presentation. No evidence of diverticulosis or diverticulitis.  Fatty liver.  Mild areas of patchy density in the dependent lower lungs could be atelectasis or mild pneumonia.   Electronically Signed   By: Nelson Chimes M.D.   On: 07/06/2019 16:21  Procedures Procedures (including critical care time)  Medications Ordered in ED Medications  sodium chloride 0.9 % bolus 1,000 mL (0 mLs Intravenous Stopped 07/06/19 1448)  ondansetron (ZOFRAN) injection 4 mg (4 mg Intravenous Given 07/06/19 1318)  sodium chloride 0.9 % bolus 1,000 mL (0 mLs Intravenous Stopped 07/06/19 1830)  iohexol (OMNIPAQUE) 300 MG/ML solution 100 mL (100 mLs Intravenous Contrast Given 07/06/19 1603)  azithromycin (ZITHROMAX) tablet 500 mg (500 mg Oral Given 07/06/19 1839)    ED Course  I have reviewed the triage vital signs and the nursing notes.  Pertinent labs & imaging results that were available during my care of the patient were reviewed by me and considered in my medical decision making (see chart for details).    MDM Rules/Calculators/A&P                      Patient presenting for evaluation of nausea, vomiting, diarrhea.  Had a syncopal episode prior to arrival.  No chest pain.  EKG shows normal sinus rhythm.  Doubt ACS/MI.  No shortness of breath, and I have a low suspicion of PE.  I suspect that she likely had a syncopal episode due to hypovolemia versus vasovagal syncope in the setting of having a bowel movement.  She is afebrile and vital signs are reassuring in the ED.  Her abdomen is soft and although she denied tenderness she had some lower abdominal tenderness on examination but  no peritoneal signs.  Lab work reviewed by me shows no leukocytosis, no anemia, hyperglycemia but no evidence of DKA.  Blood sugars improved with administration of IV fluids.  Lipase and LFTs are within normal limits.  Imaging of the abdomen pelvis shows no evidence of acute surgical abdominal pathology however does show patchy densities in the lower lungs which could be a result of atelectasis versus pneumonia.  Given her viral type symptoms we will add on a chest x-ray.  She initially refused Covid testing on my exam but I feel she would benefit from it as her symptoms could be secondary to COVID-19 infection.  4:00PM Signed out care to oncoming provider PA Idol.  Pending UA, p.o. challenge, and chest x-ray.  If concerning for pneumonia she would likely benefit from Covid testing, could potentially consider adding on antibiotics if felt to be more bacterial in etiology.  Given her reassuring vital signs, no complaint of shortness of breath and no hypoxia she will likely be stable for discharge home with symptomatic management and close PCP follow-up.  Final Clinical Impression(s) / ED Diagnoses Final diagnoses:  Nausea vomiting and diarrhea  Lower abdominal pain  Generalized weakness  Community acquired pneumonia of right middle lobe of lung    Rx / DC Orders ED Discharge Orders         Ordered    ondansetron (ZOFRAN) 4 MG tablet  Every 8 hours PRN     07/06/19 1702    azithromycin (ZITHROMAX Z-PAK) 250 MG tablet     07/06/19 1825           Jeanie Sewer, PA-C 07/09/19 1551    Maia Plan, MD 07/11/19 1953

## 2019-07-06 NOTE — ED Provider Notes (Signed)
Pt signed out to me from [facility], [place] - presenting with 3 day history of low-grade fever along with nausea vomiting and diarrhea, and a near syncopal event with a bowel movement prior to arrival presenting with elevated blood glucose level without DKA and dehydration.  Also reports mild occasional nonproductive cough.  No known Covid exposure.  She was given IV fluids, Zofran and tolerated p.o. intake, feeling improved prior to discharge.  Initially she was resistant to Covid screening, but has agreed to have the send out test completed.  CT imaging of her abdomen and pelvis was unremarkable, but suggestion of possible early pneumonia.  Plain chest x-ray suggesting atelectasis versus early pneumonia.  Given her other symptoms including low-grade fever and cough will cover her for possible pneumonia.  Zofran was also called into her pharmacy.  She was stable for discharge home.  Advised follow-up with PCP in 3 days for recheck.    Burgess Amor, PA-C 07/06/19 1826    Maia Plan, MD 07/07/19 [phone]

## 2019-07-06 NOTE — ED Notes (Signed)
Patient transported to CT 

## 2019-07-06 NOTE — ED Notes (Signed)
Pt was informed that we need a urine sample. Pt states that she can not urinate at this time. 

## 2019-07-06 NOTE — Discharge Instructions (Signed)
1. Medications:  Take Zofran as needed for nausea.  Let this medicine dissolve under your tongue and wait around 10-20 minutes before eating or drinking after taking this medication. 2. Treatment: rest, drink plenty of fluids, advance diet slowly.  Start with water and broth then advance to bland foods that will not upset your stomach such as crackers, mashed potatoes, and peanut butter. 3. Follow Up: Please followup with your primary doctor in 3 days for discussion of your diagnoses and further evaluation after today's visit; if you do not have a primary care doctor use the resource guide provided to find one; Please return to the ER for persistent vomiting, high fevers or worsening symptoms  

## 2019-07-06 NOTE — ED Triage Notes (Signed)
Patient from home with [facility]. Patient complains of n/v/d for the last few days and a fever of 100.2. Her daughter reports a syncopal episode while using the bathroom.

## 2019-07-07 LAB — SARS CORONAVIRUS 2 (TAT 6-24 HRS): SARS Coronavirus 2: POSITIVE — AB

## 2019-07-08 ENCOUNTER — Telehealth: Payer: Self-pay | Admitting: General Practice

## 2019-07-09 ENCOUNTER — Emergency Department (HOSPITAL_COMMUNITY)
Admission: EM | Admit: 2019-07-09 | Discharge: 2019-07-09 | Disposition: A | Discharge status: Home / Self Care | Payer: Medicare Other | Attending: Emergency Medicine | Admitting: Emergency Medicine

## 2019-07-09 ENCOUNTER — Other Ambulatory Visit: Payer: Self-pay

## 2019-07-09 ENCOUNTER — Ambulatory Visit: Payer: Self-pay | Admitting: *Deleted

## 2019-07-09 ENCOUNTER — Encounter (HOSPITAL_COMMUNITY): Payer: Self-pay | Admitting: Emergency Medicine

## 2019-07-09 DIAGNOSIS — I10 Essential (primary) hypertension: Secondary | ICD-10-CM | POA: Insufficient documentation

## 2019-07-09 DIAGNOSIS — E119 Type 2 diabetes mellitus without complications: Secondary | ICD-10-CM | POA: Diagnosis not present

## 2019-07-09 DIAGNOSIS — E039 Hypothyroidism, unspecified: Secondary | ICD-10-CM | POA: Insufficient documentation

## 2019-07-09 DIAGNOSIS — U071 COVID-19: Secondary | ICD-10-CM | POA: Diagnosis not present

## 2019-07-09 DIAGNOSIS — Z79899 Other long term (current) drug therapy: Secondary | ICD-10-CM | POA: Insufficient documentation

## 2019-07-09 DIAGNOSIS — Z794 Long term (current) use of insulin: Secondary | ICD-10-CM | POA: Insufficient documentation

## 2019-07-09 DIAGNOSIS — R05 Cough: Secondary | ICD-10-CM | POA: Diagnosis present

## 2019-07-09 LAB — BASIC METABOLIC PANEL
Anion gap: 10 (ref 5–15)
BUN: 8 mg/dL (ref 8–23)
CO2: 24 mmol/L (ref 22–32)
Calcium: 8.2 mg/dL — ABNORMAL LOW (ref 8.9–10.3)
Chloride: 99 mmol/L (ref 98–111)
Creatinine, Ser: 0.57 mg/dL (ref 0.44–1.00)
GFR calc Af Amer: >60 mL/min (ref >60)
GFR calc non Af Amer: >60 mL/min (ref >60)
Glucose, Bld: 265 mg/dL — ABNORMAL HIGH (ref 70–99)
Potassium: 3.6 mmol/L (ref 3.5–5.1)
Sodium: 133 mmol/L — ABNORMAL LOW (ref 135–145)

## 2019-07-09 LAB — CBC
HCT: 37.6 % (ref 36.0–46.0)
Hemoglobin: 12.7 g/dL (ref 12.0–15.0)
MCH: 30.6 pg (ref 26.0–34.0)
MCHC: 33.8 g/dL (ref 30.0–36.0)
MCV: 90.6 fL (ref 80.0–100.0)
Platelets: 263 10*3/uL (ref 150–400)
RBC: 4.15 MIL/uL (ref 3.87–5.11)
RDW: 12.1 % (ref 11.5–15.5)
WBC: 6.9 10*3/uL (ref 4.0–10.5)
nRBC: 0 % (ref 0.0–0.2)

## 2019-07-09 LAB — CBG MONITORING, ED: Glucose-Capillary: 269 mg/dL — ABNORMAL HIGH (ref 70–99)

## 2019-07-09 MED ORDER — ALBUTEROL SULFATE HFA 108 (90 BASE) MCG/ACT IN AERS
2.0000 | INHALATION_SPRAY | RESPIRATORY_TRACT | Status: DC | PRN
  Administered 2019-07-09: 2 via RESPIRATORY_TRACT
  Filled 2019-07-09: qty 6.7

## 2019-07-09 NOTE — Telephone Encounter (Signed)
Contacted pt and daughter to discuss lab result; explained the COVID test was positive; tier or symptoms reviewed; instructions given: . patient to remain in self-quarantine until they meet the "Non-Test Criteria for Ending Self-Isolation". Non-Test Criteria for Ending Self-Isolation All persons with fever and respiratory symptoms should isolate themselves until ALL conditions listed below are met: - at least 10 days since symptoms onset - AND 3 consecutive days fever free without antipyretics (acetaminophen [Tylenol] or ibuprofen [Advil]) - AND improvement in respiratory symptoms . If the patient develops respiratory issues/distress, seek medical care in the Emergency Department, call 911, reports symptoms and report COVID-19 positive test. Patient Instructions . patient to continue to utilize over-the-counter medications for fever (ibuprofen and/or Tylenol) and cough (cough medicine and/or sore throat lozenges). . patient to wear a mask around people and follow good infection prevention techniques. . Patient to should only leave home to seek medical care. . patient to send family for food, prescriptions or medicines; or use delivery service.  . If the patient must leave the home, they must wear a mask in public. . patient to limit contact with immediate family members or caregivers in the home, and use mask, social distancing, and handwashing to decrease risk to patients. o Please continue good preventive care measures, including frequent hand washing, avoid touching your face, cover coughs/sneezes with tissue or into elbow, stay out of crowds and keep a 6-foot distance from others.   . patient and family to clean hard surfaces touched by patient frequently with household cleaning products. Pt and daughter advised to notify her PCP; they were also informed the [facility] Dept was notified; they verbalized understanding.

## 2019-07-09 NOTE — Discharge Instructions (Addendum)
Tylenol and Motrin for fevers.  Follow-up with your doctor if any problems.  Use the albuterol inhaler for shortness of breath.  You have been referred to [facility] for possible outpatient treatment with monoclonal antibody.  They will contact you next week if they feel like this is a benefit for you

## 2019-07-09 NOTE — ED Triage Notes (Signed)
Pt reports was diagnosed with covid-19 and reports was told by PCP also had pneumonia. Pt reports is currently still taking antibiotics and reports general malaise, cough, elevated blood sugar. Pt reports chest discomfort with coughing and movement. Pt alert but pale in triage. Last dose of tylenol this am. Current temp in triage 102.8

## 2019-07-09 NOTE — ED Notes (Signed)
Pt notably getting weaker and near syncope in triage. Pt placed in stretcher and taken back to [address].

## 2019-07-09 NOTE — ED Provider Notes (Signed)
[facility] EMERGENCY DEPARTMENT Provider Note   CSN: [id number] Arrival date & time: 07/09/19  1307     History Chief Complaint  Patient presents with  . Cough    Dawn Anthony is a 69 y.o. female.  Patient comes in with some shortness of breath.  Patient has a history of Covid that was diagnosed on January 12.  The history is provided by the patient. No language interpreter was used.  Cough Cough characteristics:  Dry and non-productive Sputum characteristics:  Nondescript Severity:  Moderate Onset quality:  Sudden Timing:  Constant Progression:  Worsening Chronicity:  New Context: not animal exposure   Associated symptoms: no chest pain, no eye discharge, no headaches and no rash        Past Medical History:  Diagnosis Date  . Diabetes mellitus without complication ([place])   . Gastritis   . Thyroid disease     Patient Active Problem List   Diagnosis Date Noted  . Seizure ([place]) 09/23/2018  . Hypothyroidism 09/23/2018  . Diabetes mellitus ([place]) 09/23/2018  . Essential hypertension 09/23/2018    Past Surgical History:  Procedure Laterality Date  . EYE SURGERY    . OTHER SURGICAL HISTORY     shoulder surgery  . THYROID SURGERY       OB History   No obstetric history on file.     History reviewed. No pertinent family history.  Social History   Tobacco Use  . Smoking status: Never Smoker  . Smokeless tobacco: Never Used  Substance Use Topics  . Alcohol use: Never  . Drug use: Never    Home Medications Prior to Admission medications   Medication Sig Start Date End Date Taking? Authorizing Provider  azithromycin (ZITHROMAX Z-PAK) 250 MG tablet Take one tablet daily for 4 days starting 07/07/19 07/06/19  Yes Idol, Almyra Free, PA-C  carvedilol (COREG) 12.5 MG tablet Take 12.5 mg by mouth 2 (two) times daily with a meal.   Yes [provider]  cholecalciferol (VITAMIN D3) 25 MCG (1000 UT) tablet Take 1,000 Units by mouth daily.   Yes [provider]  citalopram (CELEXA) 40 MG tablet Take 40 mg by mouth daily.   Yes [provider]  HUMALOG MIX 75/25 KWIKPEN (75-25) 100 UNIT/ML Kwikpen Inject 0.2 mLs into the skin 2 (two) times daily. 06/04/19  Yes [provider]  levETIRAcetam (KEPPRA) 500 MG tablet Take 500 mg by mouth 2 (two) times daily. 06/04/19  Yes [provider]  levothyroxine (SYNTHROID, LEVOTHROID) 75 MCG tablet Take 75 mcg by mouth daily before breakfast.   Yes [provider]  losartan (COZAAR) 25 MG tablet Take 25 mg by mouth 2 (two) times daily.   Yes [provider]  lovastatin (MEVACOR) 20 MG tablet Take 20 mg by mouth at bedtime.   Yes [provider]  metFORMIN (GLUCOPHAGE) 1000 MG tablet Take 1,000 mg by mouth 2 (two) times daily with a meal.   Yes [provider]  naproxen (NAPROSYN) 500 MG tablet Take 500 mg by mouth 2 (two) times daily. 04/28/19  Yes [provider]  ondansetron (ZOFRAN) 4 MG tablet Take 1 tablet (4 mg total) by mouth every 8 (eight) hours as needed for nausea or vomiting. 07/06/19  Yes Fawze, Mina A, PA-C  pantoprazole (PROTONIX) 40 MG tablet Take 40 mg by mouth daily.   Yes [provider]    Allergies    Penicillins  Review of Systems   Review of Systems  Constitutional:  Negative for appetite change and fatigue.  HENT: Negative for congestion, ear discharge and sinus pressure.   Eyes: Negative for discharge.  Respiratory: Positive for cough.   Cardiovascular: Negative for chest pain.  Gastrointestinal: Negative for abdominal pain and diarrhea.  Genitourinary: Negative for frequency and hematuria.  Musculoskeletal: Negative for back pain.  Skin: Negative for rash.  Neurological: Negative for seizures and headaches.  Psychiatric/Behavioral: Negative for hallucinations.    Physical Exam Updated Vital Signs BP 120/77 (BP Location: Right Arm)   Pulse 66   Temp (!) 102.8 F (39.3 C) (Oral)   Resp  14   Ht 5' (1.524 m)   Wt 61 kg   SpO2 96%   BMI 26.26 kg/m   Physical Exam Vitals and nursing note reviewed.  Constitutional:      Appearance: She is well-developed.  HENT:     Head: Normocephalic.     Nose: Nose normal.  Eyes:     General: No scleral icterus.    Conjunctiva/sclera: Conjunctivae normal.  Neck:     Thyroid: No thyromegaly.  Cardiovascular:     Rate and Rhythm: Normal rate and regular rhythm.     Heart sounds: No murmur. No friction rub. No gallop.   Pulmonary:     Breath sounds: No stridor. No wheezing or rales.  Chest:     Chest wall: No tenderness.  Abdominal:     General: There is no distension.     Tenderness: There is no abdominal tenderness. There is no rebound.  Musculoskeletal:        General: Normal range of motion.     Cervical back: Neck supple.  Lymphadenopathy:     Cervical: No cervical adenopathy.  Skin:    Findings: No erythema or rash.  Neurological:     Mental Status: She is oriented to person, place, and time.     Motor: No abnormal muscle tone.     Coordination: Coordination normal.  Psychiatric:        Behavior: Behavior normal.     ED Results / Procedures / Treatments   Labs (all labs ordered are listed, but only abnormal results are displayed) Labs Reviewed  BASIC METABOLIC PANEL - Abnormal; Notable for the following components:      Result Value   Sodium 133 (*)    Glucose, Bld 265 (*)    Calcium 8.2 (*)    All other components within normal limits  CBG MONITORING, ED - Abnormal; Notable for the following components:   Glucose-Capillary 269 (*)    All other components within normal limits  CBC  CBG MONITORING, ED    EKG None  Radiology No results found.  Procedures Procedures (including critical care time)  Medications Ordered in ED Medications  albuterol (VENTOLIN HFA) 108 (90 Base) MCG/ACT inhaler 2 puff (has no administration in time range)    ED Course  I have reviewed the triage vital signs and  the nursing notes.  Pertinent labs & imaging results that were available during my care of the patient were reviewed by me and considered in my medical decision making (see chart for details).    MDM Rules/Calculators/A&P                      Patient is nontoxic.  She was discharged home with albuterol inhaler and [facility] was called with her name to put on the list for possible monoclonal antibodies.  Patient will follow-up PCP Final Clinical Impression(s) /  ED Diagnoses Final diagnoses:  COVID-19    Rx / DC Orders ED Discharge Orders    None       Bethann Berkshire, MD 07/09/19 1652

## 2019-07-09 NOTE — Telephone Encounter (Signed)
  Reason for Disposition . General information question, no triage required and triager able to answer question  Answer Assessment - Initial Assessment Questions 1. REASON FOR CALL or QUESTION: "What is your reason for calling today?" or "How can I best help you?" or "What question do you have that I can help answer?"     COVID test result questions  Protocols used: INFORMATION ONLY CALL - NO TRIAGE-A-AH

## 2019-07-10 ENCOUNTER — Telehealth: Payer: Self-pay | Admitting: Physician Assistant

## 2019-07-10 NOTE — Telephone Encounter (Signed)
Contacted pt and her daughter.  Pt does not meet criteria for monoclonal antibody treatment for COVID + outpatients due to being outside of symptom window (> 10 days).

## 2020-06-24 HISTORY — PX: CORONARY ARTERY BYPASS GRAFT: SHX141

## 2021-05-05 DIAGNOSIS — R7989 Other specified abnormal findings of blood chemistry: Secondary | ICD-10-CM | POA: Insufficient documentation

## 2021-05-05 DIAGNOSIS — R079 Chest pain, unspecified: Secondary | ICD-10-CM | POA: Insufficient documentation

## 2021-05-18 DIAGNOSIS — Z951 Presence of aortocoronary bypass graft: Secondary | ICD-10-CM | POA: Insufficient documentation

## 2021-07-06 DIAGNOSIS — E7800 Pure hypercholesterolemia, unspecified: Secondary | ICD-10-CM | POA: Insufficient documentation

## 2021-07-11 DIAGNOSIS — I2510 Atherosclerotic heart disease of native coronary artery without angina pectoris: Secondary | ICD-10-CM | POA: Insufficient documentation

## 2021-07-23 DIAGNOSIS — E1165 Type 2 diabetes mellitus with hyperglycemia: Secondary | ICD-10-CM | POA: Insufficient documentation

## 2022-06-25 ENCOUNTER — Emergency Department (HOSPITAL_COMMUNITY): Payer: Medicare Other

## 2022-06-25 ENCOUNTER — Encounter (HOSPITAL_COMMUNITY): Payer: Self-pay | Admitting: *Deleted

## 2022-06-25 ENCOUNTER — Other Ambulatory Visit: Payer: Self-pay

## 2022-06-25 ENCOUNTER — Emergency Department (HOSPITAL_COMMUNITY)
Admission: EM | Admit: 2022-06-25 | Discharge: 2022-06-25 | Disposition: A | Discharge status: Home / Self Care | Payer: Medicare Other | Attending: Emergency Medicine | Admitting: Emergency Medicine

## 2022-06-25 DIAGNOSIS — Z79899 Other long term (current) drug therapy: Secondary | ICD-10-CM | POA: Diagnosis not present

## 2022-06-25 DIAGNOSIS — S8001XA Contusion of right knee, initial encounter: Secondary | ICD-10-CM | POA: Diagnosis not present

## 2022-06-25 DIAGNOSIS — W19XXXA Unspecified fall, initial encounter: Secondary | ICD-10-CM

## 2022-06-25 DIAGNOSIS — W010XXA Fall on same level from slipping, tripping and stumbling without subsequent striking against object, initial encounter: Secondary | ICD-10-CM | POA: Diagnosis not present

## 2022-06-25 DIAGNOSIS — S4991XA Unspecified injury of right shoulder and upper arm, initial encounter: Secondary | ICD-10-CM | POA: Diagnosis present

## 2022-06-25 DIAGNOSIS — S42201A Unspecified fracture of upper end of right humerus, initial encounter for closed fracture: Secondary | ICD-10-CM | POA: Insufficient documentation

## 2022-06-25 MED ORDER — ONDANSETRON HCL 4 MG PO TABS: 4.0000 mg | ORAL_TABLET | Freq: Three times a day (TID) | ORAL | 0 refills | Status: DC | PRN

## 2022-06-25 MED ORDER — OXYCODONE-ACETAMINOPHEN 5-325 MG PO TABS
1.0000 | ORAL_TABLET | Freq: Once | ORAL | Status: AC
  Administered 2022-06-25: 1 via ORAL
  Filled 2022-06-25: qty 1

## 2022-06-25 MED ORDER — OXYCODONE-ACETAMINOPHEN 5-325 MG PO TABS: 1.0000 | ORAL_TABLET | Freq: Four times a day (QID) | ORAL | 0 refills | Status: DC | PRN

## 2022-06-25 NOTE — ED Notes (Signed)
Patient and family verbalize understanding of discharge instructions, home care and follow up care.

## 2022-06-25 NOTE — ED Triage Notes (Signed)
Pt tripped and fell and struck her right shoulder.  Pt has obvious deformity to right shoulder.  Pt also reports right knee pain from fall. Did not hit head, no LOC

## 2022-06-25 NOTE — Discharge Instructions (Signed)
You are seen in the emergency department for evaluation of injuries from a fall.  You have a fracture of your right upper arm called a humerus fracture.  The x-rays of your knee did not show any definite fracture.  We are treating you with a sling and a prescription for pain medication.  Please contact orthopedics tomorrow for follow-up.  You can use ice to the affected areas.  Return to the emergency department if any worsening or concerning symptoms

## 2022-06-25 NOTE — ED Notes (Signed)
Patient tolerated PO medications, shoulder immobilizer applied. Patient and family verbalize understanding of proper placement, and use of device.

## 2022-06-25 NOTE — ED Provider Notes (Signed)
[facility] EMERGENCY DEPARTMENT Provider Note   CSN: [id number] Arrival date & time: 06/25/22  1950     History {Add pertinent medical, surgical, social history, OB history to HPI:1} Chief Complaint  Patient presents with   Dawn Anthony is a 72 y.o. female.  She is here for evaluation of injuries after tripped and fell getting off a curb earlier tonight.  She denies hitting her head or losing consciousness.  She did say she felt dizzy afterwards.  Complaining of severe right shoulder pain and moderate right knee pain.  She was able to ambulate after the fall.  No numbness or weakness.  The history is provided by the patient.  Fall This is a new problem. The problem occurs constantly. The problem has not changed since onset.Pertinent negatives include no chest pain, no abdominal pain, no headaches and no shortness of breath. The symptoms are aggravated by bending and twisting. Nothing relieves the symptoms. She has tried nothing for the symptoms. The treatment provided no relief.       Home Medications Prior to Admission medications   Medication Sig Start Date End Date Taking? Authorizing Provider  azithromycin (ZITHROMAX Z-PAK) 250 MG tablet Take one tablet daily for 4 days starting 07/07/19 07/06/19   Evalee Jefferson, PA-C  carvedilol (COREG) 12.5 MG tablet Take 12.5 mg by mouth 2 (two) times daily with a meal.    [provider]  cholecalciferol (VITAMIN D3) 25 MCG (1000 UT) tablet Take 1,000 Units by mouth daily.    [provider]  citalopram (CELEXA) 40 MG tablet Take 40 mg by mouth daily.    [provider]  HUMALOG MIX 75/25 KWIKPEN (75-25) 100 UNIT/ML Kwikpen Inject 0.2 mLs into the skin 2 (two) times daily. 06/04/19   [provider]  levETIRAcetam (KEPPRA) 500 MG tablet Take 500 mg by mouth 2 (two) times daily. 06/04/19   [provider]  levothyroxine (SYNTHROID, LEVOTHROID) 75 MCG tablet Take 75 mcg by mouth daily before  breakfast.    [provider]  losartan (COZAAR) 25 MG tablet Take 25 mg by mouth 2 (two) times daily.    [provider]  lovastatin (MEVACOR) 20 MG tablet Take 20 mg by mouth at bedtime.    [provider]  metFORMIN (GLUCOPHAGE) 1000 MG tablet Take 1,000 mg by mouth 2 (two) times daily with a meal.    [provider]  naproxen (NAPROSYN) 500 MG tablet Take 500 mg by mouth 2 (two) times daily. 04/28/19   [provider]  ondansetron (ZOFRAN) 4 MG tablet Take 1 tablet (4 mg total) by mouth every 8 (eight) hours as needed for nausea or vomiting. 07/06/19   Rodell Perna A, PA-C  pantoprazole (PROTONIX) 40 MG tablet Take 40 mg by mouth daily.    [provider]      Allergies    Penicillins    Review of Systems   Review of Systems  Constitutional:  Negative for fever.  HENT:  Negative for sore throat.   Respiratory:  Negative for shortness of breath.   Cardiovascular:  Negative for chest pain.  Gastrointestinal:  Negative for abdominal pain.  Genitourinary:  Negative for dysuria.  Musculoskeletal:  Negative for back pain and neck pain.  Skin:  Negative for rash.  Neurological:  Negative for headaches.    Physical Exam Updated Vital Signs BP (!) 163/92 (BP Location: Left Arm)   Pulse 70   Temp 98.4 F (36.9 C) (Oral)  Resp 20   SpO2 98%  Physical Exam Vitals and nursing note reviewed.  Constitutional:      General: She is not in acute distress.    Appearance: Normal appearance. She is well-developed.  HENT:     Head: Normocephalic and atraumatic.  Eyes:     Conjunctiva/sclera: Conjunctivae normal.  Cardiovascular:     Rate and Rhythm: Normal rate and regular rhythm.     Heart sounds: No murmur heard. Pulmonary:     Effort: Pulmonary effort is normal. No respiratory distress.     Breath sounds: Normal breath sounds.  Abdominal:     Palpations: Abdomen is soft.     Tenderness: There is no abdominal tenderness.   Musculoskeletal:        General: Tenderness and signs of injury present.     Cervical back: Neck supple.     Comments: Is severe tenderness right upper arm.  Nontender elbow wrist.  Distal pulses motor and sensation.  Left upper extremity no pain.  Right lower extremity diffuse tenderness around right knee.  Ligamentous stable.  Ankle foot nontender.  Left lower extremity nontender.  Skin:    General: Skin is warm and dry.     Capillary Refill: Capillary refill takes less than 2 seconds.  Neurological:     General: No focal deficit present.     Mental Status: She is alert.     Sensory: No sensory deficit.     Motor: No weakness.     ED Results / Procedures / Treatments   Labs (all labs ordered are listed, but only abnormal results are displayed) Labs Reviewed - No data to display  EKG None  Radiology DG Knee Complete 4 Views Right  Result Date: 06/25/2022 CLINICAL DATA:  Fall EXAM: RIGHT KNEE - COMPLETE 4+ VIEW COMPARISON:  None Available. FINDINGS: No malalignment. No significant effusion. Mild joint space calcification. 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 IMPRESSION: No definite acute osseous abnormality. CT may be pursued if there is continued clinical suspicion for fracture Electronically Signed   By: Donavan Foil M.D.   On: 06/25/2022 21:04   DG Shoulder Right  Result Date: 06/25/2022 CLINICAL DATA:  Fall EXAM: RIGHT SHOULDER - 2+ VIEW COMPARISON:  None Available. FINDINGS: Acute right humeral neck fracture with mild displacement and angulation. No humeral head dislocation IMPRESSION: Acute right humeral neck fracture. Electronically Signed   By: Donavan Foil M.D.   On: 06/25/2022 21:02    Procedures Procedures  {Document cardiac monitor, telemetry assessment procedure when appropriate:1}  Medications Ordered in ED Medications  oxyCODONE-acetaminophen (PERCOCET/ROXICET) 5-325 MG per tablet 1 tablet (has no administration in time  range)    ED Course/ Medical Decision Making/ A&P                           Medical Decision Making Amount and/or Complexity of Data Reviewed Radiology: ordered.  Risk Prescription drug management.   This patient complains of ***; this involves an extensive number of treatment Options and is a complaint that carries with it a high risk of complications and morbidity. The differential includes ***  I ordered, reviewed and interpreted labs, which included *** I ordered medication *** and reviewed PMP when indicated. I ordered imaging studies which included *** and I independently    visualized and interpreted imaging which showed *** Additional history obtained from *** Previous records obtained and reviewed *** I consulted *** and discussed lab and imaging  findings and discussed disposition.  Cardiac monitoring reviewed, *** Social determinants considered, *** Critical Interventions: ***  After the interventions stated above, I reevaluated the patient and found *** Admission and further testing considered, ***   {Document critical care time when appropriate:1} {Document review of labs and clinical decision tools ie heart score, Chads2Vasc2 etc:1}  {Document your independent review of radiology images, and any outside records:1} {Document your discussion with family members, caretakers, and with consultants:1} {Document social determinants of health affecting pt's care:1} {Document your decision making why or why not admission, treatments were needed:1} Final Clinical Impression(s) / ED Diagnoses Final diagnoses:  None    Rx / DC Orders ED Discharge Orders     None

## 2022-06-28 ENCOUNTER — Ambulatory Visit (INDEPENDENT_AMBULATORY_CARE_PROVIDER_SITE_OTHER): Payer: Medicare Other

## 2022-06-28 ENCOUNTER — Ambulatory Visit (INDEPENDENT_AMBULATORY_CARE_PROVIDER_SITE_OTHER): Payer: Medicare Other | Admitting: Orthopedic Surgery

## 2022-06-28 ENCOUNTER — Encounter: Payer: Self-pay | Admitting: Orthopedic Surgery

## 2022-06-28 VITALS — BP 155/92 | HR 88 | Ht 60.0 in | Wt 145.0 lb

## 2022-06-28 DIAGNOSIS — S42201A Unspecified fracture of upper end of right humerus, initial encounter for closed fracture: Secondary | ICD-10-CM

## 2022-06-28 NOTE — Progress Notes (Signed)
New Patient Visit  Assessment: Dawn Anthony is a 72 y.o. female with the following: 1. Closed fracture of proximal end of right humerus, unspecified fracture morphology, initial encounter  Plan: Dawn Anthony sustained a right proximal humerus fracture, with mild valgus angulation at the fracture site.  Glenohumeral joint is reduced.  She has an extensive cardiac history, and we should try and avoid surgery if possible.  This was discussed with the patient and she is in agreement.  Follow-up in 2 weeks for repeat evaluation, including new x-rays.  She is to remain in the sling.  Follow-up: Return in about 2 weeks (around 07/12/2022).  Subjective:  Chief Complaint  Patient presents with   Fracture    Rt humeral fx DOI 06/25/22    History of Present Illness: Dawn Anthony is a 72 y.o. female who presents for evaluation of right shoulder pain.  Just a few days ago, she fell off a curb, and landed on her right side.  She had immediate pain in her shoulder.  She presented to the emergency department.  She was diagnosed with a proximal humerus fracture.  She has been in a sling.  She was given oxycodone, but this makes her sick.  Her pain has been controlled otherwise.  No numbness or tingling.   Review of Systems: No fevers or chills No numbness or tingling No chest pain No shortness of breath No bowel or bladder dysfunction No GI distress No headaches   Medical History:  Past Medical History:  Diagnosis Date   Diabetes mellitus without complication ([place])    Gastritis    Thyroid disease     Past Surgical History:  Procedure Laterality Date   CORONARY ARTERY BYPASS GRAFT  2022   EYE SURGERY     OTHER SURGICAL HISTORY     shoulder surgery   THYROID SURGERY      History reviewed. No pertinent family history. Social History   Tobacco Use   Smoking status: Never   Smokeless tobacco: Never  Vaping Use   Vaping Use: Never used  Substance Use Topics   Alcohol  use: Never   Drug use: Never    Allergies  Allergen Reactions   Penicillins Hives    No outpatient medications have been marked as taking for the 06/28/22 encounter (Office Visit) with Mordecai Rasmussen, MD.    Objective: BP (!) 155/92   Pulse 88   Ht 5' (1.524 m)   Wt 145 lb (65.8 kg)   BMI 28.32 kg/m   Physical Exam:  General: Alert and oriented. and No acute distress. Gait: Normal gait.  Evaluation the right shoulder demonstrates no obvious deformity.  Minimal bruising.  Minimal swelling.  Fingers are warm and well-perfused.  Sensation is intact throughout the right hand.  2+ radial pulse.   IMAGING: I personally ordered and reviewed the following images  X-rays of the right shoulder were obtained in clinic today.  These are compared to available x-rays.  There is no interval displacement of the proximal humerus fracture.  There is mild valgus angulation of the surgical neck fracture, with medial displacement of the shaft.  Glenohumeral joint is reduced.  Impression: Right proximal humerus, surgical neck fracture   New Medications:  No orders of the defined types were placed in this encounter.     Mordecai Rasmussen, MD  06/28/2022 8:32 PM

## 2022-07-12 ENCOUNTER — Encounter: Payer: Self-pay | Admitting: Orthopedic Surgery

## 2022-07-12 ENCOUNTER — Ambulatory Visit: Payer: Medicare Other | Admitting: Orthopedic Surgery

## 2022-07-12 ENCOUNTER — Ambulatory Visit (INDEPENDENT_AMBULATORY_CARE_PROVIDER_SITE_OTHER): Payer: Medicare Other

## 2022-07-12 DIAGNOSIS — E1169 Type 2 diabetes mellitus with other specified complication: Secondary | ICD-10-CM | POA: Insufficient documentation

## 2022-07-12 DIAGNOSIS — S42201D Unspecified fracture of upper end of right humerus, subsequent encounter for fracture with routine healing: Secondary | ICD-10-CM

## 2022-07-12 DIAGNOSIS — S42201A Unspecified fracture of upper end of right humerus, initial encounter for closed fracture: Secondary | ICD-10-CM | POA: Diagnosis not present

## 2022-07-12 NOTE — Progress Notes (Signed)
Return Patient Visit  Assessment: Dawn Anthony is a 72 y.o. female with the following: 1. Closed fracture of proximal end of right humerus  Plan: Dawn Anthony sustained a right proximal humerus fracture, approximately 3 weeks ago.  Radiographs are stable.  Continue with sling at all times until follow up in 2 weeks.  Medications as needed.  Follow-up: Return in 2 weeks (on 07/26/2022).  Subjective:  Chief Complaint  Patient presents with   Follow-up    Recheck on right shoulder, DOI 06-25-22.    History of Present Illness: Dawn Anthony is a 72 y.o. female who presents for evaluation of right shoulder pain.  Just over 2 weeks ago, she fell and sustained an injury to her right proximal humerus.  Pain is controlled.  She has remained in the sling.  No numbness or tingling.    Review of Systems: No fevers or chills No numbness or tingling No chest pain No shortness of breath No bowel or bladder dysfunction No GI distress No headaches    Objective: There were no vitals taken for this visit.  Physical Exam:  General: Alert and oriented. and No acute distress. Gait: Normal gait.  Right shoulder without deformity.  Minimal bruising.  Hand is warm and well perfused.  Sensation intact in the right hand.  2+ radial pulse.     IMAGING: I personally ordered and reviewed the following images  XR of the right shoulder was obtained in clinic today.  No new injury.  Surgical neck fracture remains in stable position.  No interval displacement.  Shaft has displaced medially.  Glenohumeral joint is reduced.  No bony lesions.   Impression: right proximal humerus fracture, surgical neck, in stable alignment.    New Medications:  No orders of the defined types were placed in this encounter.     Mordecai Rasmussen, MD  07/12/2022 10:45 PM

## 2022-07-12 NOTE — Patient Instructions (Signed)
Sling at all times.  If remove for shower, brace the arm across your chest, as if you were in the sling  Ok to move the hand and wrist  Follow up in 2 weeks

## 2022-07-25 ENCOUNTER — Ambulatory Visit: Payer: Self-pay | Admitting: Family Medicine

## 2022-07-26 ENCOUNTER — Ambulatory Visit (INDEPENDENT_AMBULATORY_CARE_PROVIDER_SITE_OTHER): Payer: Medicare Other

## 2022-07-26 ENCOUNTER — Ambulatory Visit (INDEPENDENT_AMBULATORY_CARE_PROVIDER_SITE_OTHER): Payer: Medicare Other | Admitting: Orthopedic Surgery

## 2022-07-26 ENCOUNTER — Encounter: Payer: Self-pay | Admitting: Orthopedic Surgery

## 2022-07-26 VITALS — Ht 60.0 in | Wt 145.0 lb

## 2022-07-26 DIAGNOSIS — S42201D Unspecified fracture of upper end of right humerus, subsequent encounter for fracture with routine healing: Secondary | ICD-10-CM

## 2022-07-26 NOTE — Patient Instructions (Signed)
Pendulum   Stand near a wall or a surface that you can hold onto for balance. Bend at the waist and let your left / right arm hang straight down. Use your other arm to support you. Keep your back straight and do not lock your knees. Relax your left / right arm and shoulder muscles, and move your hips and your trunk so your left / right arm swings freely. Your arm should swing because of the motion of your body, not because you are using your arm or shoulder muscles. Keep moving your hips and trunk so your arm swings in the following directions, as told by your health care provider: Side to side. Forward and backward. In clockwise and counterclockwise circles. Continue each motion for 20 seconds, or for as long as told by your health care provider. Slowly return to the starting position.  Repeat 10 times. Complete this exercise daily.  

## 2022-07-26 NOTE — Progress Notes (Signed)
Return Patient Visit  Assessment: Dawn Anthony is a 72 y.o. female with the following: 1. Closed fracture of proximal end of right humerus  Plan: Dawn Anthony sustained a right proximal humerus fracture, approximately 4-5 weeks ago.  Radiographs show interval consolidation.  Pain is improving.  She can start coming out of her sling to initiate gentle range of motion of the elbow wrist and hand.  She can also start doing pendulum exercises.  If upright, I want her in a sling.  Otherwise, she can remove the sling if she is sitting around.  I will see her back in approximately 2-3 weeks.  Follow-up: Return in about 19 days (around 08/14/2022) for Fx Care R Shoulder.  Subjective:  Chief Complaint  Patient presents with   Fracture    Rt shoulder  06/25/22    History of Present Illness: Dawn Anthony is a 72 y.o. female who presents for evaluation of right shoulder pain.  Approximate 1 month ago, she fell and sustained an injury to her right shoulder.  She has been in a sling.  Pain is improving.  She does note if she removes the sling, she has more radiating pains into the right shoulder.  Bruising is improving.  No numbness or tingling.   Review of Systems: No fevers or chills No numbness or tingling No chest pain No shortness of breath No bowel or bladder dysfunction No GI distress No headaches    Objective: Ht 5' (1.524 m)   Wt 145 lb (65.8 kg)   BMI 28.32 kg/m   Physical Exam:  General: Alert and oriented. and No acute distress. Gait: Normal gait.  No deformity in the right shoulder.  Healing ecchymosis over the anterior arm.  Sensation intact in the axillary nerve distribution.  Sensation intact throughout the right hand.  Active motion to all fingers intact.  IMAGING: I personally ordered and reviewed the following images  X-ray of the right shoulder was obtained in clinic today.  This was compared to prior x-rays.  Valgus impacted surgical neck fracture in  stable position.  There has been interval consolidation.  Glenohumeral joint is reduced.  There has been no further displacement.  Impression: Healing right proximal humerus fracture   New Medications:  No orders of the defined types were placed in this encounter.     Dawn Rasmussen, MD  07/26/2022 9:34 AM

## 2022-08-14 ENCOUNTER — Ambulatory Visit (INDEPENDENT_AMBULATORY_CARE_PROVIDER_SITE_OTHER): Payer: Medicare Other | Admitting: Orthopedic Surgery

## 2022-08-14 ENCOUNTER — Ambulatory Visit (INDEPENDENT_AMBULATORY_CARE_PROVIDER_SITE_OTHER): Payer: Medicare Other

## 2022-08-14 ENCOUNTER — Encounter: Payer: Self-pay | Admitting: Orthopedic Surgery

## 2022-08-14 DIAGNOSIS — S42201D Unspecified fracture of upper end of right humerus, subsequent encounter for fracture with routine healing: Secondary | ICD-10-CM

## 2022-08-14 NOTE — Progress Notes (Unsigned)
Return Patient Visit  Assessment: Dawn Anthony is a 72 y.o. female with the following: 1. Closed fracture of proximal end of right humerus  Plan: Dawn Anthony sustained a right proximal humerus fracture, approximately 6-7 weeks ago.  Radiographs are stable.  Pain is improving.  Demonstrated passive range of motion exercises in clinic today.  She tolerated gentle passive motion.  Advance activities as tolerated.  Follow-up in about a month.  Follow-up: Return in about 4 weeks (around 09/11/2022).  Subjective:  Chief Complaint  Patient presents with   Shoulder Injury    Right shoulder fracture 06/25/22    History of Present Illness: Dawn Anthony is a 72 y.o. female who returns for evaluation of right shoulder pain.  Approximately 6 weeks ago, she fell and sustained an injury to her right shoulder.  She continues to use a sling.  She has been doing some gentle exercises.  She is been working on range of motion of her elbow, wrist and fingers.  She is also started pendulum activities.  Pain is improving.  No numbness or tingling.   Review of Systems: No fevers or chills No numbness or tingling No chest pain No shortness of breath No bowel or bladder dysfunction No GI distress No headaches    Objective: There were no vitals taken for this visit.  Physical Exam:  General: Alert and oriented. and No acute distress. Gait: Normal gait.  No deformity in the right shoulder.  Minimal bruising or swelling about the right shoulder.  She tolerates passive range of motion to 90 degrees.  Fingers are warm and well-perfused.  Sensation intact throughout the right hand. IMAGING: I personally ordered and reviewed the following images  X-rays were obtained of the right shoulder in clinic today.  These are compared to prior x-rays.  Valgus impacted surgical neck fracture remains in stable position.  There has been some interval consolidation.  No dislocation.  No proximal humeral  migration.  Impression: Healing right proximal humerus fracture.   New Medications:  No orders of the defined types were placed in this encounter.     Mordecai Rasmussen, MD  08/15/2022 8:47 PM

## 2022-08-22 ENCOUNTER — Encounter: Payer: Self-pay | Admitting: Radiology

## 2022-08-28 ENCOUNTER — Encounter: Payer: Self-pay | Admitting: Family Medicine

## 2022-08-28 ENCOUNTER — Ambulatory Visit (INDEPENDENT_AMBULATORY_CARE_PROVIDER_SITE_OTHER): Payer: Medicare Other | Admitting: Family Medicine

## 2022-08-28 VITALS — BP 146/89 | HR 79 | Temp 97.7°F | Ht 61.0 in | Wt 142.8 lb

## 2022-08-28 DIAGNOSIS — E559 Vitamin D deficiency, unspecified: Secondary | ICD-10-CM

## 2022-08-28 DIAGNOSIS — E1159 Type 2 diabetes mellitus with other circulatory complications: Secondary | ICD-10-CM | POA: Diagnosis not present

## 2022-08-28 DIAGNOSIS — Z951 Presence of aortocoronary bypass graft: Secondary | ICD-10-CM | POA: Insufficient documentation

## 2022-08-28 DIAGNOSIS — Z794 Long term (current) use of insulin: Secondary | ICD-10-CM

## 2022-08-28 DIAGNOSIS — E890 Postprocedural hypothyroidism: Secondary | ICD-10-CM | POA: Diagnosis not present

## 2022-08-28 DIAGNOSIS — E785 Hyperlipidemia, unspecified: Secondary | ICD-10-CM

## 2022-08-28 DIAGNOSIS — I152 Hypertension secondary to endocrine disorders: Secondary | ICD-10-CM

## 2022-08-28 DIAGNOSIS — E1169 Type 2 diabetes mellitus with other specified complication: Secondary | ICD-10-CM

## 2022-08-28 LAB — HM DIABETES EYE EXAM

## 2022-08-28 MED ORDER — LISINOPRIL 5 MG PO TABS: 5.0000 mg | ORAL_TABLET | Freq: Every day | ORAL | 3 refills | Status: DC

## 2022-08-28 NOTE — Progress Notes (Signed)
Subjective:  Patient ID: Dawn Anthony, female    DOB: 1950/08/29, 72 y.o.   MRN: [id number]  Patient Care Team: Baruch Gouty, FNP as PCP - General (Family Medicine)   Chief Complaint:  New Patient (Initial Visit) ([facility] ) and Establish Care   HPI: Dawn Anthony is a 72 y.o. female presenting on 08/28/2022 for New Patient (Initial Visit) ([facility] ) and Establish Care   Pt presents today to establish care with new PCP and for management of chronic medical conditions. She was last seen by a PCP 11/2021. EHR has been reviewed in detail.  She has a history of T2DM with associated hypertension, hyperlipidemia, and CAD s/p CABG x 3. She also has postoperative hypothyroidism and Vit D deficiency. 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 No polyuria, polyphagia, or polydipsia. States she had part of her thyroid removed due to thyroid nodules. She has not followed up with endocrinology in years. She has not seen cardiology in several months.  CABG 04/2021: LIMA to LAD, reverse saphenous vein to diagonal #1 and posterior lateral branch. States after she was discharged she moved to [facility] with her brother and then to [place] with her daughter. States she returned to the area last year. She saw her PCP in 11/2021 but states she lives closer to this office and wanted to establish here due to transportation difficulties.   Relevant past medical, surgical, family, and social history reviewed and updated as indicated.  Allergies and medications reviewed and updated. Data reviewed: Chart in Epic.   Past Medical History:  Diagnosis Date   Diabetes mellitus without complication ([place])    Gastritis    Hyperlipidemia    Hypertension    Thyroid disease     Past Surgical History:  Procedure Laterality Date   CORONARY ARTERY BYPASS GRAFT  2022   EYE SURGERY     KNEE SURGERY Bilateral    OTHER SURGICAL HISTORY     shoulder surgery   THYROID SURGERY       Social History   Socioeconomic History   Marital status: Widowed    Spouse name: Not on file   Number of children: 1   Years of education: Not on file   Highest education level: Not on file  Occupational History   Not on file  Tobacco Use   Smoking status: Never   Smokeless tobacco: Never  Vaping Use   Vaping Use: Never used  Substance and Sexual Activity   Alcohol use: Never   Drug use: Never   Sexual activity: Not Currently  Other Topics Concern   Not on file  Social History Narrative   Not on file   Social Determinants of Health   Financial Resource Strain: Not on file  Food Insecurity: Not on file  Transportation Needs: Not on file  Physical Activity: Not on file  Stress: Not on file  Social Connections: Not on file  Intimate Partner Violence: Not on file    Outpatient Encounter Medications as of 08/28/2022  Medication Sig   aspirin EC 81 MG tablet Take 81 mg by mouth daily. Swallow whole.   carvedilol (COREG) 12.5 MG tablet Take 12.5 mg by mouth 2 (two) times daily with a meal.   cholecalciferol (VITAMIN D3) 25 MCG (1000 UT) tablet Take 50 Units by mouth daily.   Continuous Blood Gluc Receiver (FREESTYLE LIBRE 14 DAY READER) DEVI    empagliflozin (JARDIANCE) 25 MG  TABS tablet Take 25 mg by mouth daily.   insulin degludec (TRESIBA FLEXTOUCH) 100 UNIT/ML FlexTouch Pen Inject 20 Units into the skin daily.   insulin lispro (HUMALOG KWIKPEN) 100 UNIT/ML KwikPen Inject 100 Units into the skin. Am- 6 units Lunch 8 units  PM- 10 units   levothyroxine (SYNTHROID, LEVOTHROID) 75 MCG tablet Take 75 mcg by mouth daily before breakfast.   lisinopril (ZESTRIL) 5 MG tablet Take 1 tablet (5 mg total) by mouth daily.   metFORMIN (GLUCOPHAGE) 1000 MG tablet Take 1,000 mg by mouth 2 (two) times daily with a meal.   rosuvastatin (CRESTOR) 20 MG tablet Take 20 mg by mouth daily.   [DISCONTINUED] lovastatin (MEVACOR) 20 MG tablet Take 20 mg by mouth at bedtime.   [DISCONTINUED]  ondansetron (ZOFRAN) 4 MG tablet Take 1 tablet (4 mg total) by mouth every 8 (eight) hours as needed for nausea or vomiting.   [DISCONTINUED] oxyCODONE-acetaminophen (PERCOCET/ROXICET) 5-325 MG tablet Take 1 tablet by mouth every 6 (six) hours as needed for severe pain.   No facility-administered encounter medications on file as of 08/28/2022.    Allergies  Allergen Reactions   Hydrochlorothiazide     Other Reaction(s): Unknown-Unspecified  Stopped due to pancreatitis   Insulin Glargine-Lixisenatide Nausea And Vomiting   Simvastatin     Other Reaction(s): Unknown-Unspecified  Stopped due to pancreatitis  Stopped due to pancreatitis   Penicillins Hives   Amoxicillin Itching and Rash   Cat Hair Extract Itching and Rash   Latex Other (See Comments) and Rash    Review of Systems  Constitutional:  Negative for activity change, appetite change, chills, diaphoresis, fatigue, fever and unexpected weight change.  HENT: Negative.    Eyes: Negative.  Negative for photophobia and visual disturbance.  Respiratory:  Negative for cough, chest tightness and shortness of breath.   Cardiovascular:  Negative for chest pain, palpitations and leg swelling.  Gastrointestinal:  Negative for abdominal pain, blood in stool, constipation, diarrhea, nausea and vomiting.  Endocrine: Negative.   Genitourinary:  Negative for decreased urine volume, difficulty urinating, dysuria, frequency and urgency.  Musculoskeletal:  Negative for arthralgias and myalgias.  Skin: Negative.   Allergic/Immunologic: Negative.   Neurological:  Negative for dizziness, tremors, seizures, syncope, facial asymmetry, speech difficulty, weakness, light-headedness, numbness and headaches.  Hematological: Negative.   Psychiatric/Behavioral:  Negative for confusion, hallucinations, sleep disturbance and suicidal ideas.   All other systems reviewed and are negative.       Objective:  BP (!) 146/89   Pulse 79   Temp 97.7 F (36.5  C) (Temporal)   Ht '5\' 1"'$  (1.549 m)   Wt 142 lb 12.8 oz (64.8 kg)   SpO2 97%   BMI 26.98 kg/m    Wt Readings from Last 3 Encounters:  08/28/22 142 lb 12.8 oz (64.8 kg)  07/26/22 145 lb (65.8 kg)  06/28/22 145 lb (65.8 kg)    Physical Exam Vitals and nursing note reviewed.  Constitutional:      General: She is not in acute distress.    Appearance: Normal appearance. She is well-developed, well-groomed and overweight. She is not ill-appearing, toxic-appearing or diaphoretic.     Comments: Well healed sternotomy  HENT:     Head: Normocephalic and atraumatic.     Jaw: There is normal jaw occlusion.     Right Ear: Hearing normal.     Left Ear: Hearing normal.     Nose: Nose normal.     Mouth/Throat:     Lips:  Pink.     Mouth: Mucous membranes are moist.     Pharynx: Oropharynx is clear. Uvula midline.  Eyes:     General: Lids are normal.     Extraocular Movements: Extraocular movements intact.     Conjunctiva/sclera: Conjunctivae normal.     Pupils: Pupils are equal, round, and reactive to light.  Neck:     Thyroid: No thyroid mass, thyromegaly or thyroid tenderness.     Vascular: No carotid bruit or JVD.     Trachea: Trachea and phonation normal.  Cardiovascular:     Rate and Rhythm: Normal rate and regular rhythm.     Chest Wall: PMI is not displaced.     Pulses: Normal pulses.     Heart sounds: Normal heart sounds. No murmur heard.    No friction rub. No gallop.  Pulmonary:     Effort: Pulmonary effort is normal. No respiratory distress.     Breath sounds: Normal breath sounds. No wheezing.  Abdominal:     General: Bowel sounds are normal. There is no distension or abdominal bruit.     Palpations: Abdomen is soft. There is no hepatomegaly or splenomegaly.     Tenderness: There is no abdominal tenderness. There is no right CVA tenderness or left CVA tenderness.     Hernia: No hernia is present.  Musculoskeletal:        General: Normal range of motion.     Cervical  back: Normal range of motion and neck supple.     Right lower leg: No edema.     Left lower leg: No edema.  Lymphadenopathy:     Cervical: No cervical adenopathy.  Skin:    General: Skin is warm and dry.     Capillary Refill: Capillary refill takes less than 2 seconds.     Coloration: Skin is not cyanotic, jaundiced or pale.     Findings: No rash.  Neurological:     General: No focal deficit present.     Mental Status: She is alert and oriented to person, place, and time.     Sensory: Sensation is intact.     Motor: Motor function is intact.     Coordination: Coordination is intact.     Gait: Gait is intact.     Deep Tendon Reflexes: Reflexes are normal and symmetric.  Psychiatric:        Attention and Perception: Attention and perception normal.        Mood and Affect: Mood and affect normal.        Speech: Speech normal.        Behavior: Behavior normal. Behavior is cooperative.        Thought Content: Thought content normal.        Cognition and Memory: Cognition and memory normal.        Judgment: Judgment normal.     Results for orders placed or performed during the hospital encounter of [id number]  Basic metabolic panel  Result Value Ref Range   Sodium 133 (L) 135 - 145 mmol/L   Potassium 3.6 3.5 - 5.1 mmol/L   Chloride 99 98 - 111 mmol/L   CO2 24 22 - 32 mmol/L   Glucose, Bld 265 (H) 70 - 99 mg/dL   BUN 8 8 - 23 mg/dL   Creatinine, Ser 0.57 0.44 - 1.00 mg/dL   Calcium 8.2 (L) 8.9 - 10.3 mg/dL   GFR calc non Af Amer >60 >60 mL/min   GFR calc Af Amer >60 >  60 mL/min   Anion gap 10 5 - 15  CBC  Result Value Ref Range   WBC 6.9 4.0 - 10.5 K/uL   RBC 4.15 3.87 - 5.11 MIL/uL   Hemoglobin 12.7 12.0 - 15.0 g/dL   HCT 37.6 36.0 - 46.0 %   MCV 90.6 80.0 - 100.0 fL   MCH 30.6 26.0 - 34.0 pg   MCHC 33.8 30.0 - 36.0 g/dL   RDW 12.1 11.5 - 15.5 %   Platelets 263 150 - 400 K/uL   nRBC 0.0 0.0 - 0.2 %  CBG monitoring, ED  Result Value Ref Range   Glucose-Capillary 269 (H)  70 - 99 mg/dL       Pertinent labs & imaging results that were available during my care of the patient were reviewed by me and considered in my medical decision making.  Assessment & Plan:  Kadaisha was seen today for new patient (initial visit) and establish care.  Diagnoses and all orders for this visit:  Type 2 diabetes mellitus with other specified complication, with long-term current use of insulin ([place]) Referral to CCM as pt will need help with resources for transportation to and from appointments and coordination of care.  Labs pending, will adjust regimen once labs have resulted and if warranted.  -     AMB Referral to Chronic Care Management Services -     CBC with Differential/Platelet -     CMP14+EGFR -     Lipid panel -     Thyroid Panel With TSH -     Microalbumin / creatinine urine ratio -     Bayer DCA Hb A1c Waived -     lisinopril (ZESTRIL) 5 MG tablet; Take 1 tablet (5 mg total) by mouth daily. -     Ambulatory referral to Cardiology  Hypertension associated with diabetes ([place]) Not at goal and not on ACEi or ARB. Will start lisinopril as prescribed today. DASH diet and exercise encouraged. Referral to reestablish with cardiology placed.  -     AMB Referral to Chronic Care Management Services -     CBC with Differential/Platelet -     CMP14+EGFR -     Lipid panel -     Thyroid Panel With TSH -     Microalbumin / creatinine urine ratio -     lisinopril (ZESTRIL) 5 MG tablet; Take 1 tablet (5 mg total) by mouth daily. -     Ambulatory referral to Cardiology  Hyperlipidemia associated with type 2 diabetes mellitus ([place]) Diet encouraged - increase intake of fresh fruits and vegetables, increase intake of lean proteins. Bake, broil, or grill foods. Avoid fried, greasy, and fatty foods. Avoid fast foods. Increase intake of fiber-rich whole grains. Exercise encouraged - at least 150 minutes per week and advance as tolerated.  Goal BMI < 25. Continue medications as  prescribed. Follow up in 3-6 months as discussed.  -     AMB Referral to Chronic Care Management Services -     CMP14+EGFR -     Lipid panel -     Ambulatory referral to Cardiology  Postoperative hypothyroidism Labs pending. Will adjsut regimen if warranted. Will refer to endocrinology if warranted.  -     AMB Referral to Chronic Care Management Services -     Thyroid Panel With TSH  S/P CABG x 3 No anginal symptoms reported. Referral to reestablish with cardiology placed.  -     Ambulatory referral to Cardiology  Vitamin D  deficiency Labs pending, will adjust regimen if warranted.  -     VITAMIN D 25 Hydroxy (Vit-D Deficiency, Fractures)     Continue all other maintenance medications.  Follow up plan: Return in about 4 weeks (around 09/25/2022) for HTN.   Continue healthy lifestyle choices, including diet (rich in fruits, vegetables, and lean proteins, and low in salt and simple carbohydrates) and exercise (at least 30 minutes of moderate physical activity daily).  Educational handout given for DM  The above assessment and management plan was discussed with the patient. The patient verbalized understanding of and has agreed to the management plan. Patient is aware to call the clinic if they develop any new symptoms or if symptoms persist or worsen. Patient is aware when to return to the clinic for a follow-up visit. Patient educated on when it is appropriate to go to the emergency department.   Monia Pouch, FNP-C [facility] [phone]

## 2022-08-28 NOTE — Patient Instructions (Signed)

## 2022-08-29 LAB — VITAMIN D 25 HYDROXY (VIT D DEFICIENCY, FRACTURES): Vit D, 25-Hydroxy: 56 ng/mL (ref 30.0–100.0)

## 2022-08-29 LAB — CBC WITH DIFFERENTIAL/PLATELET
Basophils Absolute: 0.1 10*3/uL (ref 0.0–0.2)
Basos: 1 %
EOS (ABSOLUTE): 0.3 10*3/uL (ref 0.0–0.4)
Eos: 6 %
Hematocrit: 42.2 % (ref 34.0–46.6)
Hemoglobin: 13.9 g/dL (ref 11.1–15.9)
Immature Grans (Abs): 0 10*3/uL (ref 0.0–0.1)
Immature Granulocytes: 0 %
Lymphocytes Absolute: 1.7 10*3/uL (ref 0.7–3.1)
Lymphs: 31 %
MCH: 30 pg (ref 26.6–33.0)
MCHC: 32.9 g/dL (ref 31.5–35.7)
MCV: 91 fL (ref 79–97)
Monocytes Absolute: 0.5 10*3/uL (ref 0.1–0.9)
Monocytes: 9 %
Neutrophils Absolute: 3 10*3/uL (ref 1.4–7.0)
Neutrophils: 53 %
Platelets: 273 10*3/uL (ref 150–450)
RBC: 4.64 x10E6/uL (ref 3.77–5.28)
RDW: 13.2 % (ref 11.7–15.4)
WBC: 5.7 10*3/uL (ref 3.4–10.8)

## 2022-08-29 LAB — CMP14+EGFR
ALT: 16 IU/L (ref 0–32)
AST: 22 IU/L (ref 0–40)
Albumin/Globulin Ratio: 2.1 (ref 1.2–2.2)
Albumin: 4.5 g/dL (ref 3.9–4.9)
Alkaline Phosphatase: 79 IU/L (ref 44–121)
BUN/Creatinine Ratio: 29 — ABNORMAL HIGH (ref 12–28)
BUN: 19 mg/dL (ref 8–27)
Bilirubin Total: 0.3 mg/dL (ref 0.0–1.2)
CO2: 21 mmol/L (ref 20–29)
Calcium: 9.1 mg/dL (ref 8.7–10.3)
Chloride: 104 mmol/L (ref 96–106)
Creatinine, Ser: 0.65 mg/dL (ref 0.57–1.00)
Globulin, Total: 2.1 g/dL (ref 1.5–4.5)
Glucose: 121 mg/dL — ABNORMAL HIGH (ref 70–99)
Potassium: 4.2 mmol/L (ref 3.5–5.2)
Sodium: 145 mmol/L — ABNORMAL HIGH (ref 134–144)
Total Protein: 6.6 g/dL (ref 6.0–8.5)
eGFR: 95 mL/min/{1.73_m2} (ref >59)

## 2022-08-29 LAB — LIPID PANEL
Chol/HDL Ratio: 1.8 ratio (ref 0.0–4.4)
Cholesterol, Total: 129 mg/dL (ref 100–199)
HDL: 70 mg/dL (ref >39)
LDL Chol Calc (NIH): 46 mg/dL (ref 0–99)
Triglycerides: 63 mg/dL (ref 0–149)
VLDL Cholesterol Cal: 13 mg/dL (ref 5–40)

## 2022-08-29 LAB — MICROALBUMIN / CREATININE URINE RATIO
Creatinine, Urine: 67 mg/dL
Microalb/Creat Ratio: 25 mg/g creat (ref 0–29)
Microalbumin, Urine: 16.6 ug/mL

## 2022-08-29 LAB — THYROID PANEL WITH TSH
Free Thyroxine Index: 3.3 (ref 1.2–4.9)
T3 Uptake Ratio: 31 % (ref 24–39)
T4, Total: 10.7 ug/dL (ref 4.5–12.0)
TSH: 1.53 u[IU]/mL (ref 0.450–4.500)

## 2022-08-29 LAB — BAYER DCA HB A1C WAIVED: HB A1C (BAYER DCA - WAIVED): 9.8 % — ABNORMAL HIGH (ref 4.8–5.6)

## 2022-08-30 NOTE — Addendum Note (Signed)
Addended by: Baruch Gouty on: 08/30/2022 02:36 PM   Modules accepted: Orders

## 2022-09-02 ENCOUNTER — Telehealth: Payer: Self-pay

## 2022-09-02 NOTE — Progress Notes (Signed)
  Chronic Care Management   Note  09/02/2022 Name: Dawn Anthony MRN: [id number] DOB: 1950-10-23  Dawn Anthony is a 72 y.o. year old female who is a primary care patient of Rakes, Connye Burkitt, FNP. I reached out to Dawn Anthony by phone today in response to a referral sent by Dawn Anthony PCP.  Dawn Anthony was given information about Chronic Care Management services today including:  CCM service includes personalized support from designated clinical staff supervised by the physician, including individualized plan of care and coordination with other care providers 24/7 contact phone numbers for assistance for urgent and routine care needs. Service will only be billed when office clinical staff spend 20 minutes or more in a month to coordinate care. Only one practitioner may furnish and bill the service in a calendar month. The patient may stop CCM services at amy time (effective at the end of the month) by phone call to the office staff. The patient will be responsible for cost sharing (co-pay) or up to 20% of the service fee (after annual deductible is met)  Dawn Anthony  agreedto scheduling an appointment with the CCM RN Case Manager   Follow up plan: Patient agreed to scheduled appointment with RN Case Manager on 09/04/2022 pharm d 09/19/2022(date/time).   Dawn Anthony, [name], [name] [address] Direct Dial: [phone] [email or web address]

## 2022-09-04 ENCOUNTER — Ambulatory Visit (INDEPENDENT_AMBULATORY_CARE_PROVIDER_SITE_OTHER): Payer: Medicare Other | Admitting: *Deleted

## 2022-09-04 DIAGNOSIS — E1159 Type 2 diabetes mellitus with other circulatory complications: Secondary | ICD-10-CM

## 2022-09-04 DIAGNOSIS — E1169 Type 2 diabetes mellitus with other specified complication: Secondary | ICD-10-CM

## 2022-09-04 NOTE — Plan of Care (Signed)
Chronic Care Management Provider Comprehensive Care Plan    09/04/2022 Name: Winner Bergland MRN: [id number] DOB: 1950/07/26  Referral to Chronic Care Management (CCM) services was placed by Provider:  Darla Lesches FNP on Date: 08/27/68.  Chronic Condition 1: HYPERTENSION Provider Assessment and Plan  Hypertension associated with diabetes ([place]) Not at goal and not on ACEi or ARB. Will start lisinopril as prescribed today. DASH diet and exercise encouraged. Referral to reestablish with cardiology placed.  -     AMB Referral to Chronic Care Management Services -     CBC with Differential/Platelet -     CMP14+EGFR -     Lipid panel -     Thyroid Panel With TSH -     Microalbumin / creatinine urine ratio -     lisinopril (ZESTRIL) 5 MG tablet; Take 1 tablet (5 mg total) by mouth daily. -     Ambulatory referral to Cardiology  Expected Outcome/Goals Addressed This Visit (Provider CCM goals/Provider Assessment and plan  CCM (HYPERTENSION) EXPECTED OUTCOME: MONITOR, SELF-MANAGE AND REDUCE SYMPTOMS OF HYPERTENSION  Symptom Management Condition 1: Take medications as prescribed   Attend all scheduled provider appointments Call pharmacy for medication refills 3-7 days in advance of running out of medications Attend church or other social activities Perform all self care activities independently  Call provider office for new concerns or questions  check blood pressure weekly choose a place to take my blood pressure (home, clinic or office, retail store) write blood pressure results in a log or diary learn about high blood pressure keep a blood pressure log take blood pressure log to all doctor appointments call doctor for signs and symptoms of high blood pressure develop an action plan for high blood pressure take medications for blood pressure exactly as prescribed report new symptoms to your doctor eat more whole grains, fruits and vegetables, lean meats and healthy fats Look over  education sent via my chart- low sodium diet Advanced directives packet mailed- please look over and complete Continue walking daily- keep up the good work!  Chronic Condition 2: DIABETES Provider Assessment and Plan Type 2 diabetes mellitus with other specified complication, with long-term current use of insulin ([place]) Referral to CCM as pt will need help with resources for transportation to and from appointments and coordination of care.  Labs pending, will adjust regimen once labs have resulted and if warranted.  -     AMB Referral to Chronic Care Management Services -     CBC with Differential/Platelet -     CMP14+EGFR -     Lipid panel -     Thyroid Panel With TSH -     Microalbumin / creatinine urine ratio -     Bayer DCA Hb A1c Waived -     lisinopril (ZESTRIL) 5 MG tablet; Take 1 tablet (5 mg total) by mouth daily. -     Ambulatory referral to Cardiology   Expected Outcome/Goals Addressed This Visit (Provider CCM goals/Provider Assessment and plan  CCM (DIABETES) EXPECTED OUTCOME: MONITOR, SELF- MANAGE AND REDUCE SYMPTOMS OF DIABETES  Symptom Management Condition 2: Take medications as prescribed   Attend all scheduled provider appointments Call pharmacy for medication refills 3-7 days in advance of running out of medications Attend church or other social activities Perform all self care activities independently  Call provider office for new concerns or questions  check blood sugar at prescribed times: per [facility]  check feet daily for cuts, sores or redness enter blood sugar  readings and medication or insulin into daily log take the blood sugar log to all doctor visits fill half of plate with vegetables limit fast food meals to no more than 1 per week manage portion size prepare main meal at home 3 to 5 days each week read food labels for fat, fiber, carbohydrates and portion size keep feet up while sitting wash and dry feet carefully every day wear comfortable,  cotton socks wear comfortable, well-fitting shoes Goal for blood sugars- fasting blood sugar goals of 80-130 and less than 180 1 1/2-2 hours after meals fall prevention strategies: change position slowly, use assistive device such as walker or cane (per provider recommendations) when walking, keep walkways clear, have good lighting in room. It is important to contact your provider if you have any falls, maintain muscle strength/tone by exercise per provider recommendations.  Problem List Patient Active Problem List   Diagnosis Date Noted   S/P CABG x 3 08/28/2022   Hyperlipidemia associated with type 2 diabetes mellitus ([place]) 07/12/2022   Coronary artery disease involving native coronary artery of native heart without angina pectoris 07/11/2021   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   Diabetes mellitus ([place]) 09/23/2018   Hypertension associated with diabetes ([place]) 09/23/2018   Multiple thyroid nodules 08/31/2018   Status post total knee replacement, left 03/12/2017   Unilateral primary osteoarthritis, left knee 12/26/2016   Postoperative hypothyroidism 12/02/2008    Medication Management  Current Outpatient Medications:    aspirin EC 81 MG tablet, Take 81 mg by mouth daily. Swallow whole., Disp: , Rfl:    carvedilol (COREG) 12.5 MG tablet, Take 12.5 mg by mouth 2 (two) times daily with a meal., Disp: , Rfl:    cholecalciferol (VITAMIN D3) 25 MCG (1000 UT) tablet, Take 50 Units by mouth daily., Disp: , Rfl:    Continuous Blood Gluc Receiver (FREESTYLE LIBRE 14 DAY READER) DEVI, , Disp: , Rfl:    empagliflozin (JARDIANCE) 25 MG TABS tablet, Take 25 mg by mouth daily., Disp: , Rfl:    insulin degludec (TRESIBA FLEXTOUCH) 100 UNIT/ML FlexTouch Pen, Inject 20 Units into the skin daily., Disp: , Rfl:    insulin lispro (HUMALOG KWIKPEN) 100 UNIT/ML KwikPen, Inject 100 Units into the skin. Am- 6 units Lunch 8 units  PM- 10 units, Disp: , Rfl:    levothyroxine (SYNTHROID,  LEVOTHROID) 75 MCG tablet, Take 75 mcg by mouth daily before breakfast., Disp: , Rfl:    lisinopril (ZESTRIL) 5 MG tablet, Take 1 tablet (5 mg total) by mouth daily., Disp: 90 tablet, Rfl: 3   metFORMIN (GLUCOPHAGE) 1000 MG tablet, Take 1,000 mg by mouth 2 (two) times daily with a meal., Disp: , Rfl:    rosuvastatin (CRESTOR) 20 MG tablet, Take 20 mg by mouth daily., Disp: , Rfl:   Cognitive Assessment Identity Confirmed: : Name; DOB Cognitive Status: Normal   Functional Assessment Hearing Difficulty or Deaf: no Wear Glasses or Blind: yes Vision Management: Can see well w/ glasses Concentrating, Remembering or Making Decisions Difficulty (CP): no Difficulty Communicating: no Difficulty Eating/Swallowing: no Walking or Climbing Stairs Difficulty: no Dressing/Bathing Difficulty: yes Dressing/Bathing: bathing difficulty, requires equipment Dressing/Bathing Management: has shower seat Doing Errands Independently Difficulty (such as shopping) (CP): yes Errands Management: grandson provides transportation   Caregiver Assessment  Primary Source of Support/Comfort: extended family (adult grandson) Name of Support/Comfort Primary Source: adult grandson (age 49) lives wtih pt People in Home: grandchild(ren)   Planned Interventions  Provided education to patient about basic DM disease  process; Reviewed medications with patient and discussed importance of medication adherence;        Reviewed prescribed diet with patient carbohydrate modified; Counseled on importance of regular laboratory monitoring as prescribed;        Discussed plans with patient for ongoing care management follow up and provided patient with direct contact information for care management team;      Provided patient with written educational materials related to hypo and hyperglycemia and importance of correct treatment;       Reviewed scheduled/upcoming provider appointments including: pharmD on 09/19/22 at 11 am, primary  care provider 09/25/22 at 850 am;         Advised patient, providing education and rationale, to check cbg per CGM  and record        Review of patient status, including review of consultants reports, relevant laboratory and other test results, and medications completed;       Screening for signs and symptoms of depression related to chronic disease state;        Assessed social determinant of health barriers;        Reviewed fasting blood sugar goals of 80-130 and less than 180 1 1/2-2 hours after meals Reviewed safety precautions Evaluation of current treatment plan related to hypertension self management and patient's adherence to plan as established by provider;   Reviewed prescribed diet low sodium Reviewed medications with patient and discussed importance of compliance;  Counseled on the importance of exercise goals with target of 150 minutes per week Discussed plans with patient for ongoing care management follow up and provided patient with direct contact information for care management team; Advised patient, providing education and rationale, to monitor blood pressure daily and record, calling PCP for findings outside established parameters;  Provided education on prescribed diet low sodium;  Discussed complications of poorly controlled blood pressure such as heart disease, stroke, circulatory complications, vision complications, kidney impairment, sexual dysfunction;  Screening for signs and symptoms of depression related to chronic disease state;  Assessed social determinant of health barriers;  Advanced directive information mailed  Interaction and coordination with outside resources, practitioners, and providers See CCM Referral  Care Plan: Available in MyChart

## 2022-09-04 NOTE — Patient Instructions (Signed)
Please call the care guide team at [phone] if you need to cancel or reschedule your appointment.   If you are experiencing a Mental Health or [place] or need someone to talk to, please call the Suicide and Crisis Lifeline: 988 call the [place] National Suicide Prevention Lifeline: (438)156-0749 or TTY: 903-304-7554 TTY 908-014-6984) to talk to a trained counselor call 1-800-273-TALK (toll free, 24 hour hotline) go to [facility] [address] [place] [phone]) call the [facility]: [phone] call 911   Following is a copy of the CCM Program Consent:  CCM service includes personalized support from designated clinical staff supervised by the physician, including individualized plan of care and coordination with other care providers 24/7 contact phone numbers for assistance for urgent and routine care needs. Service will only be billed when office clinical staff spend 20 minutes or more in a month to coordinate care. Only one practitioner may furnish and bill the service in a calendar month. The patient may stop CCM services at amy time (effective at the end of the month) by phone call to the office staff. The patient will be responsible for cost sharing (co-pay) or up to 20% of the service fee (after annual deductible is met)  Following is a copy of your full provider care plan:   Goals Addressed             This Visit's Progress    CCM (DIABETES) EXPECTED OUTCOME: MONITOR, SELF- MANAGE AND REDUCE SYMPTOMS OF DIABETES       Current Barriers:  Knowledge Deficits related to Diabetes management Chronic Disease Management support and education needs related to Diabetes, diet and nutrition No Advanced Directives in place- pt requests information, documents mailed Patient will be working with pharmacist Patient reports CBG is monitored by CGM Freestyle [place]re with fasting ranges 101-102, random ranges  200-220, pt states she did not take her insulin for a month because she fractured her right arm in a fall on 06/25/22, her grandson did not want to do the injection, pt feels this is why her [place] went up to 9.8, pt states she is working on her diet, walking daily and is working towards goal of lowering AIC to below 7. Patient reports she saw a dietician in the past and wants to wait and see if she can get AIC down on her own, then if she cannot will revisit the idea of seeing a nutritionist  Planned Interventions: Provided education to patient about basic DM disease process; Reviewed medications with patient and discussed importance of medication adherence;        Reviewed prescribed diet with patient carbohydrate modified; Counseled on importance of regular laboratory monitoring as prescribed;        Discussed plans with patient for ongoing care management follow up and provided patient with direct contact information for care management team;      Provided patient with written educational materials related to hypo and hyperglycemia and importance of correct treatment;       Reviewed scheduled/upcoming provider appointments including: pharmD on 09/19/22 at 11 am, primary care provider 09/25/22 at 850 am;         Advised patient, providing education and rationale, to check cbg per CGM  and record        Review of patient status, including review of consultants reports, relevant laboratory and other test results, and medications completed;       Screening for signs and symptoms of  depression related to chronic disease state;        Assessed social determinant of health barriers;        Reviewed fasting blood sugar goals of 80-130 and less than 180 1 1/2-2 hours after meals Reviewed safety precautions  Symptom Management: Take medications as prescribed   Attend all scheduled provider appointments Call pharmacy for medication refills 3-7 days in advance of running out of medications Attend church or  other social activities Perform all self care activities independently  Call provider office for new concerns or questions  check blood sugar at prescribed times: per [facility]  check feet daily for cuts, sores or redness enter blood sugar readings and medication or insulin into daily log take the blood sugar log to all doctor visits fill half of plate with vegetables limit fast food meals to no more than 1 per week manage portion size prepare main meal at home 3 to 5 days each week read food labels for fat, fiber, carbohydrates and portion size keep feet up while sitting wash and dry feet carefully every day wear comfortable, cotton socks wear comfortable, well-fitting shoes Goal for blood sugars- fasting blood sugar goals of 80-130 and less than 180 1 1/2-2 hours after meals fall prevention strategies: change position slowly, use assistive device such as walker or cane (per provider recommendations) when walking, keep walkways clear, have good lighting in room. It is important to contact your provider if you have any falls, maintain muscle strength/tone by exercise per provider recommendations.  Follow Up Plan: Telephone follow up appointment with care management team member scheduled for:  11/04/22 at 130 pm       CCM (HYPERTENSION) EXPECTED OUTCOME: MONITOR, SELF-MANAGE AND REDUCE SYMPTOMS OF HYPERTENSION       Current Barriers:  Knowledge Deficits related to Hypertension management Care Coordination needs related to pharmacy/ medication management in a patient with Hypertension Chronic Disease Management support and education needs related to Hypertension, diet No Advanced Directives in place- pt requests information be mailed Patient reports her 18 year old grandson lives with her, provides transportation as pt is currently not driving, pt states she is independent with all aspects of her care, has a shower seat to make bathing easier. Patient reports she has blood pressure  cuff and does not monitor blood pressure but is willing to start Patient reports she has started walking daily for 30 minutes  Planned Interventions: Evaluation of current treatment plan related to hypertension self management and patient's adherence to plan as established by provider;   Reviewed prescribed diet low sodium Reviewed medications with patient and discussed importance of compliance;  Counseled on the importance of exercise goals with target of 150 minutes per week Discussed plans with patient for ongoing care management follow up and provided patient with direct contact information for care management team; Advised patient, providing education and rationale, to monitor blood pressure daily and record, calling PCP for findings outside established parameters;  Provided education on prescribed diet low sodium;  Discussed complications of poorly controlled blood pressure such as heart disease, stroke, circulatory complications, vision complications, kidney impairment, sexual dysfunction;  Screening for signs and symptoms of depression related to chronic disease state;  Assessed social determinant of health barriers;  Advanced directive information mailed  Symptom Management: Take medications as prescribed   Attend all scheduled provider appointments Call pharmacy for medication refills 3-7 days in advance of running out of medications Attend church or other social activities Perform all self care activities  independently  Call provider office for new concerns or questions  check blood pressure weekly choose a place to take my blood pressure (home, clinic or office, retail store) write blood pressure results in a log or diary learn about high blood pressure keep a blood pressure log take blood pressure log to all doctor appointments call doctor for signs and symptoms of high blood pressure develop an action plan for high blood pressure take medications for blood pressure  exactly as prescribed report new symptoms to your doctor eat more whole grains, fruits and vegetables, lean meats and healthy fats Look over education sent via my chart- low sodium diet Advanced directives packet mailed- please look over and complete Continue walking daily- keep up the good work!  Follow Up Plan: Telephone follow up appointment with care management team member scheduled for: 11/04/22 at 130 pm           Patient verbalizes understanding of instructions and care plan provided today and agrees to view in [place]. Active MyChart status and patient understanding of how to access instructions and care plan via MyChart confirmed with patient.     Low-Sodium Eating Plan Sodium, which is an element that makes up salt, helps you maintain a healthy balance of fluids in your body. Too much sodium can increase your blood pressure and cause fluid and waste to be held in your body. Your health care provider or dietitian may recommend following this plan if you have high blood pressure (hypertension), kidney disease, liver disease, or heart failure. Eating less sodium can help lower your blood pressure, reduce swelling, and protect your heart, liver, and kidneys. What are tips for following this plan? Reading food labels The Nutrition Facts label lists the amount of sodium in one serving of the food. If you eat more than one serving, you must multiply the listed amount of sodium by the number of servings. Choose foods with less than 140 mg of sodium per serving. Avoid foods with 300 mg of sodium or more per serving. Shopping  Look for lower-sodium products, often labeled as "low-sodium" or "no salt added." Always check the sodium content, even if foods are labeled as "unsalted" or "no salt added." Buy fresh foods. Avoid canned foods and pre-made or frozen meals. Avoid canned, cured, or processed meats. Buy breads that have less than 80 mg of sodium per slice. Cooking  Eat more  home-cooked food and less restaurant, buffet, and fast food. Avoid adding salt when cooking. Use salt-free seasonings or herbs instead of table salt or sea salt. Check with your health care provider or pharmacist before using salt substitutes. Cook with plant-based oils, such as canola, sunflower, or olive oil. Meal planning When eating at a restaurant, ask that your food be prepared with less salt or no salt, if possible. Avoid dishes labeled as brined, pickled, cured, smoked, or made with soy sauce, miso, or teriyaki sauce. Avoid foods that contain MSG (monosodium glutamate). MSG is sometimes added to [place] food, bouillon, and some canned foods. Make meals that can be grilled, baked, poached, roasted, or steamed. These are generally made with less sodium. General information Most people on this plan should limit their sodium intake to 1,500-2,000 mg (milligrams) of sodium each day. What foods should I eat? Fruits Fresh, frozen, or canned fruit. Fruit juice. Vegetables Fresh or frozen vegetables. "No salt added" canned vegetables. "No salt added" tomato sauce and paste. Low-sodium or reduced-sodium tomato and vegetable juice. Grains Low-sodium cereals, including oats, puffed wheat  and rice, and shredded wheat. Low-sodium crackers. Unsalted rice. Unsalted pasta. Low-sodium bread. Whole-grain breads and whole-grain pasta. Meats and other proteins Fresh or frozen (no salt added) meat, poultry, seafood, and fish. Low-sodium canned tuna and salmon. Unsalted nuts. Dried peas, beans, and lentils without added salt. Unsalted canned beans. Eggs. Unsalted nut butters. Dairy Milk. Soy milk. Cheese that is naturally low in sodium, such as ricotta cheese, fresh mozzarella, or Swiss cheese. Low-sodium or reduced-sodium cheese. Cream cheese. Yogurt. Seasonings and condiments Fresh and dried herbs and spices. Salt-free seasonings. Low-sodium mustard and ketchup. Sodium-free salad dressing. Sodium-free light  mayonnaise. Fresh or refrigerated horseradish. Lemon juice. Vinegar. Other foods Homemade, reduced-sodium, or low-sodium soups. Unsalted popcorn and pretzels. Low-salt or salt-free chips. The items listed above may not be a complete list of foods and beverages you can eat. Contact a dietitian for more information. What foods should I avoid? Vegetables Sauerkraut, pickled vegetables, and relishes. Olives. [place] fries. Onion rings. Regular canned vegetables (not low-sodium or reduced-sodium). Regular canned tomato sauce and paste (not low-sodium or reduced-sodium). Regular tomato and vegetable juice (not low-sodium or reduced-sodium). Frozen vegetables in sauces. Grains Instant hot cereals. Bread stuffing, pancake, and biscuit mixes. Croutons. Seasoned rice or pasta mixes. Noodle soup cups. Boxed or frozen macaroni and cheese. Regular salted crackers. Self-rising flour. Meats and other proteins Meat or fish that is salted, canned, smoked, spiced, or pickled. Precooked or cured meat, such as sausages or meat loaves. Berniece Salines. Ham. Pepperoni. Hot dogs. Corned beef. Chipped beef. Salt pork. Jerky. Pickled herring. Anchovies and sardines. Regular canned tuna. Salted nuts. Dairy Processed cheese and cheese spreads. Hard cheeses. Cheese curds. Blue cheese. Feta cheese. String cheese. Regular cottage cheese. Buttermilk. Canned milk. Fats and oils Salted butter. Regular margarine. Ghee. Bacon fat. Seasonings and condiments Onion salt, garlic salt, seasoned salt, table salt, and sea salt. Canned and packaged gravies. Worcestershire sauce. Tartar sauce. Barbecue sauce. Teriyaki sauce. Soy sauce, including reduced-sodium. Steak sauce. Fish sauce. Oyster sauce. Cocktail sauce. Horseradish that you find on the shelf. Regular ketchup and mustard. Meat flavorings and tenderizers. Bouillon cubes. Hot sauce. Pre-made or packaged marinades. Pre-made or packaged taco seasonings. Relishes. Regular salad dressings.  Salsa. Other foods Salted popcorn and pretzels. Corn chips and puffs. Potato and tortilla chips. Canned or dried soups. Pizza. Frozen entrees and pot pies. The items listed above may not be a complete list of foods and beverages you should avoid. Contact a dietitian for more information. Summary Eating less sodium can help lower your blood pressure, reduce swelling, and protect your heart, liver, and kidneys. Most people on this plan should limit their sodium intake to 1,500-2,000 mg (milligrams) of sodium each day. Canned, boxed, and frozen foods are high in sodium. Restaurant foods, fast foods, and pizza are also very high in sodium. You also get sodium by adding salt to food. Try to cook at home, eat more fresh fruits and vegetables, and eat less fast food and canned, processed, or prepared foods. This information is not intended to replace advice given to you by your health care provider. Make sure you discuss any questions you have with your health care provider. Document Revised: 05/17/2019 Document Reviewed: 05/12/2019 Elsevier Patient Education  [place]. Hypoglycemia Hypoglycemia is when the sugar (glucose) level in your blood is too low. Low blood sugar can happen to people who have diabetes and people who do not have diabetes. Low blood sugar can happen quickly, and it can be an emergency. What are the  causes? This condition happens most often in people who have diabetes. It may be caused by: Diabetes medicine. Not eating enough, or not eating often enough. Doing more physical activity. Drinking alcohol on an empty stomach. If you do not have diabetes, this condition may be caused by: A tumor in the pancreas. Not eating enough, or not eating for long periods at a time (fasting). A very bad infection or illness. Problems after having weight loss (bariatric) surgery. Kidney failure or liver failure. Certain medicines. What increases the risk? This condition is more  likely to develop in people who: Have diabetes and take medicines to lower their blood sugar. Abuse alcohol. Have a very bad illness. What are the signs or symptoms? Mild Hunger. Sweating and feeling clammy. Feeling dizzy or light-headed. Being sleepy or having trouble sleeping. Feeling like you may vomit (nauseous). A fast heartbeat. A headache. Blurry vision. Mood changes, such as: Being grouchy. Feeling worried or nervous (anxious). Tingling or loss of feeling (numbness) around your mouth, lips, or tongue. Moderate Confusion and poor judgment. Behavior changes. Weakness. Uneven heartbeat. Trouble with moving (coordination). Very low Very low blood sugar (severe hypoglycemia) is a medical emergency. It can cause: Fainting. Seizures. Loss of consciousness (coma). Death. How is this treated? Treating low blood sugar Low blood sugar is often treated by eating or drinking something that has sugar in it right away. The food or drink should contain 15 grams of a fast-acting carb (carbohydrate). Options include: 4 oz (120 mL) of fruit juice. 4 oz (120 mL) of regular soda (not diet soda). A few pieces of hard candy. Check food labels to see how many pieces to eat for 15 grams. 1 Tbsp (15 mL) of sugar or honey. 4 glucose tablets. 1 tube of glucose gel. Treating low blood sugar if you have diabetes If you can think clearly and swallow safely, follow the 15:15 rule: Take 15 grams of a fast-acting carb. Talk with your doctor about how much you should take. Always keep a source of fast-acting carb with you, such as: Glucose tablets (take 4 tablets). A few pieces of hard candy. Check food labels to see how many pieces to eat for 15 grams. 4 oz (120 mL) of fruit juice. 4 oz (120 mL) of regular soda (not diet soda). 1 Tbsp (15 mL) of honey or sugar. 1 tube of glucose gel. Check your blood sugar 15 minutes after you take the carb. If your blood sugar is still at or below 70 mg/dL  (3.9 mmol/L), take 15 grams of a carb again. If your blood sugar does not go above 70 mg/dL (3.9 mmol/L) after 3 tries, get help right away. After your blood sugar goes back to normal, eat a meal or a snack within 1 hour.  Treating very low blood sugar If your blood sugar is below 54 mg/dL (3 mmol/L), you have very low blood sugar, or severe hypoglycemia. This is an emergency. Get medical help right away. If you have very low blood sugar and you cannot eat or drink, you will need to be given a hormone called glucagon. A family member or friend should learn how to check your blood sugar and how to give you glucagon. Ask your doctor if you need to have an emergency glucagon kit at home. Very low blood sugar may also need to be treated in a hospital. Follow these instructions at home: General instructions Take over-the-counter and prescription medicines only as told by your doctor. Stay aware of your  blood sugar as told by your doctor. If you drink alcohol: Limit how much you have to: 0-1 drink a day for women who are not pregnant. 0-2 drinks a day for men. Know how much alcohol is in your drink. In the [place] one drink equals one 12 oz bottle of beer (355 mL), one 5 oz glass of wine (148 mL), or one 1 oz glass of hard liquor (44 mL). Be sure to eat food when you drink alcohol. Know that your body absorbs alcohol quickly. This may lead to low blood sugar later. Be sure to keep checking your blood sugar. Keep all follow-up visits. If you have diabetes:  Always have a fast-acting carb (15 grams) with you to treat low blood sugar. Follow your diabetes care plan as told by your doctor. Make sure you: Know the symptoms of low blood sugar. Check your blood sugar as often as told. Always check it before and after exercise. Always check your blood sugar before you drive. Take your medicines as told. Follow your meal plan. Eat on time. Do not skip meals. Share your diabetes care plan with: Your  work or school. People you live with. Carry a card or wear jewelry that says you have diabetes. Where to find more information American Diabetes Association: www.diabetes.org Contact a doctor if: You have trouble keeping your blood sugar in your target range. You have low blood sugar often. Get help right away if: You still have symptoms after you eat or drink something that contains 15 grams of fast-acting carb, and you cannot get your blood sugar above 70 mg/dL by following the 15:15 rule. Your blood sugar is below 54 mg/dL (3 mmol/L). You have a seizure. You faint. These symptoms may be an emergency. Get help right away. Call your local emergency services (911 in the [place] Do not wait to see if the symptoms will go away. Do not drive yourself to the hospital. Summary Hypoglycemia happens when the level of sugar (glucose) in your blood is too low. Low blood sugar can happen to people who have diabetes and people who do not have diabetes. Low blood sugar can happen quickly, and it can be an emergency. Make sure you know the symptoms of low blood sugar and know how to treat it. Always keep a source of sugar (fast-acting carb) with you to treat low blood sugar. This information is not intended to replace advice given to you by your health care provider. Make sure you discuss any questions you have with your health care provider. Document Revised: 05/11/2020 Document Reviewed: 05/11/2020 Elsevier Patient Education  [place].   Telephone follow up appointment with care management team member scheduled for:

## 2022-09-04 NOTE — Chronic Care Management (AMB) (Signed)
Chronic Care Management   CCM RN Visit Note  09/04/2022 Name: Dawn Anthony MRN: [id number] DOB: Jul 14, 1950  Subjective: Dawn Anthony is a 72 y.o. year old female who is a primary care patient of Dawn Anthony, Dawn Burkitt, FNP. The patient was referred to the Chronic Care Management team for assistance with care management needs subsequent to provider initiation of CCM services and plan of care.    Today's Visit:  Engaged with patient by telephone for initial visit.     SDOH Interventions Today    Flowsheet Row Most Recent Value  SDOH Interventions   Food Insecurity Interventions Intervention Not Indicated  Housing Interventions Intervention Not Indicated  Transportation Interventions Intervention Not Indicated  Utilities Interventions Intervention Not Indicated  Financial Strain Interventions Intervention Not Indicated  Physical Activity Interventions Intervention Not Indicated  Stress Interventions Intervention Not Indicated  Social Connections Interventions Intervention Not Indicated         Goals Addressed             This Visit's Progress    CCM (DIABETES) EXPECTED OUTCOME: MONITOR, SELF- MANAGE AND REDUCE SYMPTOMS OF DIABETES       Current Barriers:  Knowledge Deficits related to Diabetes management Chronic Disease Management support and education needs related to Diabetes, diet and nutrition No Advanced Directives in place- pt requests information, documents mailed Patient will be working with pharmacist Patient reports CBG is monitored by CGM Freestyle Libre with fasting ranges 101-102, random ranges 200-220, pt states she did not take her insulin for a month because she fractured her right arm in a fall on 06/25/22, her grandson did not want to do the injection, pt feels this is why her AIC went up to 9.8, pt states she is working on her diet, walking daily and is working towards goal of lowering AIC to below 7. Patient reports she saw a dietician in the past and wants to  wait and see if she can get AIC down on her own, then if she cannot will revisit the idea of seeing a nutritionist  Planned Interventions: Provided education to patient about basic DM disease process; Reviewed medications with patient and discussed importance of medication adherence;        Reviewed prescribed diet with patient carbohydrate modified; Counseled on importance of regular laboratory monitoring as prescribed;        Discussed plans with patient for ongoing care management follow up and provided patient with direct contact information for care management team;      Provided patient with written educational materials related to hypo and hyperglycemia and importance of correct treatment;       Reviewed scheduled/upcoming provider appointments including: pharmD on 09/19/22 at 11 am, primary care provider 09/25/22 at 850 am;         Advised patient, providing education and rationale, to check cbg per CGM  and record        Review of patient status, including review of consultants reports, relevant laboratory and other test results, and medications completed;       Screening for signs and symptoms of depression related to chronic disease state;        Assessed social determinant of health barriers;        Reviewed fasting blood sugar goals of 80-130 and less than 180 1 1/2-2 hours after meals Reviewed safety precautions  Symptom Management: Take medications as prescribed   Attend all scheduled provider appointments Call pharmacy for medication refills 3-7 days in advance of running out  of medications Attend church or other social activities Perform all self care activities independently  Call provider office for new concerns or questions  check blood sugar at prescribed times: per [facility]  check feet daily for cuts, sores or redness enter blood sugar readings and medication or insulin into daily log take the blood sugar log to all doctor visits fill half of plate with  vegetables limit fast food meals to no more than 1 per week manage portion size prepare main meal at home 3 to 5 days each week read food labels for fat, fiber, carbohydrates and portion size keep feet up while sitting wash and dry feet carefully every day wear comfortable, cotton socks wear comfortable, well-fitting shoes Goal for blood sugars- fasting blood sugar goals of 80-130 and less than 180 1 1/2-2 hours after meals fall prevention strategies: change position slowly, use assistive device such as walker or cane (per provider recommendations) when walking, keep walkways clear, have good lighting in room. It is important to contact your provider if you have any falls, maintain muscle strength/tone by exercise per provider recommendations.  Follow Up Plan: Telephone follow up appointment with care management team member scheduled for:  11/04/22 at 130 pm       CCM (HYPERTENSION) EXPECTED OUTCOME: MONITOR, SELF-MANAGE AND REDUCE SYMPTOMS OF HYPERTENSION       Current Barriers:  Knowledge Deficits related to Hypertension management Care Coordination needs related to pharmacy/ medication management in a patient with Hypertension Chronic Disease Management support and education needs related to Hypertension, diet No Advanced Directives in place- pt requests information be mailed Patient reports her 47 year old grandson lives with her, provides transportation as pt is currently not driving, pt states she is independent with all aspects of her care, has a shower seat to make bathing easier. Patient reports she has blood pressure cuff and does not monitor blood pressure but is willing to start Patient reports she has started walking daily for 30 minutes  Planned Interventions: Evaluation of current treatment plan related to hypertension self management and patient's adherence to plan as established by provider;   Reviewed prescribed diet low sodium Reviewed medications with patient and  discussed importance of compliance;  Counseled on the importance of exercise goals with target of 150 minutes per week Discussed plans with patient for ongoing care management follow up and provided patient with direct contact information for care management team; Advised patient, providing education and rationale, to monitor blood pressure daily and record, calling PCP for findings outside established parameters;  Provided education on prescribed diet low sodium;  Discussed complications of poorly controlled blood pressure such as heart disease, stroke, circulatory complications, vision complications, kidney impairment, sexual dysfunction;  Screening for signs and symptoms of depression related to chronic disease state;  Assessed social determinant of health barriers;  Advanced directive information mailed  Symptom Management: Take medications as prescribed   Attend all scheduled provider appointments Call pharmacy for medication refills 3-7 days in advance of running out of medications Attend church or other social activities Perform all self care activities independently  Call provider office for new concerns or questions  check blood pressure weekly choose a place to take my blood pressure (home, clinic or office, retail store) write blood pressure results in a log or diary learn about high blood pressure keep a blood pressure log take blood pressure log to all doctor appointments call doctor for signs and symptoms of high blood pressure develop an action plan for  high blood pressure take medications for blood pressure exactly as prescribed report new symptoms to your doctor eat more whole grains, fruits and vegetables, lean meats and healthy fats Look over education sent via my chart- low sodium diet Advanced directives packet mailed- please look over and complete Continue walking daily- keep up the good work!  Follow Up Plan: Telephone follow up appointment with care management  team member scheduled for: 11/04/22 at 130 pm           Plan:Telephone follow up appointment with care management team member scheduled for:  11/04/22 at 130 pm  Jacqlyn Larsen [facility], BSN RN Case Manager [place] [phone]

## 2022-09-10 ENCOUNTER — Ambulatory Visit (INDEPENDENT_AMBULATORY_CARE_PROVIDER_SITE_OTHER): Payer: Medicare Other | Admitting: Orthopedic Surgery

## 2022-09-10 ENCOUNTER — Encounter: Payer: Self-pay | Admitting: Family Medicine

## 2022-09-10 ENCOUNTER — Encounter: Payer: Self-pay | Admitting: Orthopedic Surgery

## 2022-09-10 ENCOUNTER — Other Ambulatory Visit (INDEPENDENT_AMBULATORY_CARE_PROVIDER_SITE_OTHER): Payer: Medicare Other

## 2022-09-10 DIAGNOSIS — S42201D Unspecified fracture of upper end of right humerus, subsequent encounter for fracture with routine healing: Secondary | ICD-10-CM

## 2022-09-10 NOTE — Progress Notes (Signed)
Return Patient Visit  Assessment: Dawn Anthony is a 72 y.o. female with the following: 1. Closed fracture of proximal end of right humerus  Plan: Dawn Anthony sustained a right proximal humerus fracture, approximately 10 ago.  Radiographs are stable.  She has previous range of motion in clinic today.  Urged her to continue working on strength and range of motion.  Continue exercising, with gradual increase in the level of activities.  She states her understanding.  She will follow-up as needed.   follow-up: Return if symptoms worsen or fail to improve.  Subjective:  Chief Complaint  Patient presents with   Follow-up    Recheck on right shoulder    History of Present Illness: Dawn Anthony is a 72 y.o. female who returns for evaluation of right shoulder pain.  Injury was sustained a little over 2 months ago.  She is no longer using a sling.  She is working on physical therapy exercises.  She is happy with her improvements.  She reports minimal pain in her right shoulder.  Review of Systems: No fevers or chills No numbness or tingling No chest pain No shortness of breath No bowel or bladder dysfunction No GI distress No headaches    Objective: There were no vitals taken for this visit.  Physical Exam:  General: Alert and oriented. and No acute distress. Gait: Normal gait.  Right shoulder without swelling.  No bruising.  Active forward flexion to 120 degrees.  Internal rotation to the lumbar spine.  Abduction to 90 degrees.  Fingers are warm and well-perfused.  Sensation intact throughout the right upper.  IMAGING: I personally ordered and reviewed the following images  X-rays were obtained of the right shoulder today.  Compared to prior x-rays.  No acute injuries are appreciated.  Glenohumeral joint is reduced.  Fracture line is barely visible.  There has been interval consolidation.  Impression: Right proximal humerus fracture stable alignment.   New  Medications:  No orders of the defined types were placed in this encounter.     Mordecai Rasmussen, MD  09/10/2022 1:32 PM

## 2022-09-11 ENCOUNTER — Other Ambulatory Visit: Payer: Self-pay | Admitting: *Deleted

## 2022-09-11 ENCOUNTER — Other Ambulatory Visit: Payer: Self-pay | Admitting: Family Medicine

## 2022-09-12 ENCOUNTER — Encounter: Payer: Self-pay | Admitting: Nurse Practitioner

## 2022-09-13 ENCOUNTER — Telehealth: Payer: Self-pay | Admitting: Family Medicine

## 2022-09-13 NOTE — Telephone Encounter (Signed)
Called patient to schedule Medicare Annual Wellness Visit (AWV). No voicemail available to leave a message.  Last date of AWV: due 06/24/2018 awvi per [facility]  Please schedule an appointment at any time with either Mickel Baas or [facility], NHA's. .  If any questions, please contact me at [phone].  Thank you,  Colletta [place],  [facility] Direct Dial ??[phone]

## 2022-09-16 ENCOUNTER — Other Ambulatory Visit: Payer: Self-pay | Admitting: Family Medicine

## 2022-09-16 DIAGNOSIS — E119 Type 2 diabetes mellitus without complications: Secondary | ICD-10-CM

## 2022-09-18 ENCOUNTER — Telehealth: Payer: Self-pay

## 2022-09-18 NOTE — Progress Notes (Unsigned)
  Care Coordination Note  09/18/2022 Name: Dawn Anthony MRN: [id number] DOB: 1951-01-03  Dawn Anthony is a 72 y.o. year old female who is a primary care patient of Rakes, Connye Burkitt, FNP and is actively engaged with the Chronic Care Management team. I reached out to Dawn Anthony by phone today to assist with re-scheduling an initial visit with the Pharmacist  Follow up plan: Unsuccessful telephone outreach attempt made. A HIPAA compliant phone message was left for the patient providing contact information and requesting a return call.  The care management team will reach out to the patient again over the next 7 days.  If patient returns call to provider office, please advise to call [facility]  at [place], [place], [place] [address] Direct Dial: [phone] [email or web address]

## 2022-09-19 ENCOUNTER — Telehealth: Payer: Medicare Other

## 2022-09-22 DIAGNOSIS — Z794 Long term (current) use of insulin: Secondary | ICD-10-CM

## 2022-09-22 DIAGNOSIS — I10 Essential (primary) hypertension: Secondary | ICD-10-CM

## 2022-09-22 DIAGNOSIS — E1159 Type 2 diabetes mellitus with other circulatory complications: Secondary | ICD-10-CM

## 2022-09-25 ENCOUNTER — Ambulatory Visit (INDEPENDENT_AMBULATORY_CARE_PROVIDER_SITE_OTHER): Payer: Medicare Other | Admitting: Family Medicine

## 2022-09-25 ENCOUNTER — Encounter: Payer: Self-pay | Admitting: Family Medicine

## 2022-09-25 VITALS — BP 134/87 | HR 77 | Temp 97.4°F | Ht 61.0 in | Wt 140.0 lb

## 2022-09-25 DIAGNOSIS — E1159 Type 2 diabetes mellitus with other circulatory complications: Secondary | ICD-10-CM | POA: Diagnosis not present

## 2022-09-25 DIAGNOSIS — I152 Hypertension secondary to endocrine disorders: Secondary | ICD-10-CM | POA: Diagnosis not present

## 2022-09-25 NOTE — Patient Instructions (Signed)

## 2022-09-25 NOTE — Progress Notes (Signed)
Subjective:  Patient ID: Dawn Anthony, female    DOB: 18-Jan-1951, 72 y.o.   MRN: [id number]  Patient Care Team: Baruch Gouty, FNP as PCP - General (Family Medicine) Kassie Mends, RN as [place] Management   Chief Complaint:  Hypertension (4 week follow up )   HPI: Dawn Anthony is a 72 y.o. female presenting on 09/25/2022 for Hypertension (4 week follow up )   Hypertension This is a chronic problem. The current episode started more than 1 year ago (recently added Lisinopril to regimen as HTN was uncontrolled and she has a history of DM). Pertinent negatives include no anxiety, blurred vision, chest pain, headaches, malaise/fatigue, neck pain, orthopnea, palpitations, peripheral edema, PND, shortness of breath or sweats. Risk factors for coronary artery disease include diabetes mellitus, dyslipidemia, obesity, family history, sedentary lifestyle and post-menopausal state. Past treatments include ACE inhibitors and beta blockers. Compliance problems include exercise.      Relevant past medical, surgical, family, and social history reviewed and updated as indicated.  Allergies and medications reviewed and updated. Data reviewed: Chart in Epic.   Past Medical History:  Diagnosis Date   Diabetes mellitus without complication    Gastritis    Hyperlipidemia    Hypertension    Thyroid disease     Past Surgical History:  Procedure Laterality Date   CORONARY ARTERY BYPASS GRAFT  2022   EYE SURGERY     KNEE SURGERY Bilateral    OTHER SURGICAL HISTORY     shoulder surgery   THYROID SURGERY      Social History   Socioeconomic History   Marital status: Widowed    Spouse name: Not on file   Number of children: 1   Years of education: Not on file   Highest education level: 12th grade  Occupational History   Not on file  Tobacco Use   Smoking status: Never   Smokeless tobacco: Never  Vaping Use   Vaping Use: Never used  Substance and Sexual  Activity   Alcohol use: Never   Drug use: Never   Sexual activity: Not Currently  Other Topics Concern   Not on file  Social History Narrative   Not on file   Social Determinants of Health   Financial Resource Strain: Low Risk  (09/23/2022)   Overall Financial Resource Strain (CARDIA)    Difficulty of Paying Living Expenses: Not very hard  Food Insecurity: Food Insecurity Present (09/23/2022)   Hunger Vital Sign    Worried About Dawn Anthony in the Last Year: Sometimes true    Ran Out of Food in the Last Year: Sometimes true  Transportation Needs: No Transportation Needs (09/23/2022)   PRAPARE - Hydrologist (Medical): No    Lack of Transportation (Non-Medical): No  Physical Activity: Insufficiently Active (09/23/2022)   Exercise Vital Sign    Days of Exercise per Week: 2 days    Minutes of Exercise per Session: 20 min  Stress: No Stress Concern Present (09/23/2022)   [place]    Feeling of Stress : Not at all  Social Connections: Moderately Isolated (09/23/2022)   Social Connection and Isolation Panel [NHANES]    Frequency of Communication with Friends and Family: More than three times a week    Frequency of Social Gatherings with Friends and Family: Once a week    Attends Religious Services: 1 to 4 times per year  Active Member of Clubs or Organizations: No    Attends Archivist Meetings: Never    Marital Status: Widowed  Intimate Partner Violence: Not At Risk (09/04/2022)   Humiliation, Afraid, Rape, and Kick questionnaire    Fear of Current or Ex-Partner: No    Emotionally Abused: No    Physically Abused: No    Sexually Abused: No    Outpatient Encounter Medications as of 09/25/2022  Medication Sig   aspirin EC 81 MG tablet Take 81 mg by mouth daily. Swallow whole.   carvedilol (COREG) 12.5 MG tablet Take 12.5 mg by mouth 2 (two) times daily with a meal.    cholecalciferol (VITAMIN D3) 25 MCG (1000 UT) tablet Take 50 Units by mouth daily.   Continuous Blood Gluc Receiver (FREESTYLE LIBRE 14 DAY READER) DEVI    empagliflozin (JARDIANCE) 25 MG TABS tablet Take 25 mg by mouth daily.   insulin degludec (TRESIBA FLEXTOUCH) 100 UNIT/ML FlexTouch Pen Inject 20 Units into the skin daily.   insulin lispro (HUMALOG KWIKPEN) 100 UNIT/ML KwikPen Inject 100 Units into the skin. Am- 6 units Lunch 8 units  PM- 10 units   Insulin Pen Needle (BD PEN NEEDLE NANO U/F) 32G X 4 MM MISC UAD QID Dx E11.69   levothyroxine (SYNTHROID, LEVOTHROID) 75 MCG tablet Take 75 mcg by mouth daily before breakfast.   lisinopril (ZESTRIL) 5 MG tablet Take 1 tablet (5 mg total) by mouth daily.   metFORMIN (GLUCOPHAGE) 1000 MG tablet Take 1,000 mg by mouth 2 (two) times daily with a meal.   rosuvastatin (CRESTOR) 20 MG tablet Take 20 mg by mouth daily.   No facility-administered encounter medications on file as of 09/25/2022.    Allergies  Allergen Reactions   Hydrochlorothiazide     Other Reaction(s): Unknown-Unspecified  Stopped due to pancreatitis   Insulin Glargine-Lixisenatide Nausea And Vomiting   Simvastatin     Other Reaction(s): Unknown-Unspecified  Stopped due to pancreatitis  Stopped due to pancreatitis   Penicillins Hives   Amoxicillin Itching and Rash   Cat Hair Extract Itching and Rash   Latex Other (See Comments) and Rash    Review of Systems  Constitutional:  Negative for activity change, appetite change, chills, fatigue, fever and malaise/fatigue.  HENT: Negative.    Eyes: Negative.  Negative for blurred vision, photophobia and visual disturbance.  Respiratory:  Negative for cough, chest tightness and shortness of breath.   Cardiovascular:  Negative for chest pain, palpitations, orthopnea, leg swelling and PND.  Gastrointestinal:  Negative for blood in stool, constipation, diarrhea, nausea and vomiting.  Endocrine: Negative.   Genitourinary:  Negative  for decreased urine volume, difficulty urinating, dysuria, frequency and urgency.  Musculoskeletal:  Negative for arthralgias, myalgias and neck pain.  Skin: Negative.   Allergic/Immunologic: Negative.   Neurological:  Negative for dizziness, tremors, seizures, syncope, facial asymmetry, speech difficulty, weakness, light-headedness, numbness and headaches.  Hematological: Negative.   Psychiatric/Behavioral:  Negative for confusion, hallucinations, sleep disturbance and suicidal ideas.   All other systems reviewed and are negative.       Objective:  BP 134/87   Pulse 77   Temp (!) 97.4 F (36.3 C) (Temporal)   Ht 5\' 1"  (1.549 m)   Wt 140 lb (63.5 kg)   SpO2 96%   BMI 26.45 kg/m    Wt Readings from Last 3 Encounters:  09/25/22 140 lb (63.5 kg)  08/28/22 142 lb 12.8 oz (64.8 kg)  07/26/22 145 lb (65.8 kg)  Physical Exam Vitals and nursing note reviewed.  Constitutional:      Appearance: Normal appearance.  HENT:     Head: Normocephalic and atraumatic.     Mouth/Throat:     Mouth: Mucous membranes are moist.  Eyes:     Pupils: Pupils are equal, round, and reactive to light.  Cardiovascular:     Rate and Rhythm: Normal rate and regular rhythm.     Heart sounds: Normal heart sounds.     Comments: Well healed sternotomy Pulmonary:     Effort: Pulmonary effort is normal.     Breath sounds: Normal breath sounds.  Musculoskeletal:     Right lower leg: No edema.     Left lower leg: No edema.  Skin:    General: Skin is warm.     Capillary Refill: Capillary refill takes less than 2 seconds.  Neurological:     General: No focal deficit present.     Mental Status: She is alert and oriented to person, place, and time.  Psychiatric:        Mood and Affect: Mood normal.        Behavior: Behavior normal.        Thought Content: Thought content normal.        Judgment: Judgment normal.     Results for orders placed or performed in visit on 08/29/22  HM DIABETES EYE EXAM   Result Value Ref Range   HM Diabetic Eye Exam No Retinopathy No Retinopathy       Pertinent labs & imaging results that were available during my care of the patient were reviewed by me and considered in my medical decision making.  Assessment & Plan:  Claree was seen today for hypertension.  Diagnoses and all orders for this visit:  Hypertension associated with diabetes BP well controlled. Changes were not made in regimen today. Goal BP is 130/80. Pt aware to report any persistent high or low readings. DASH diet and exercise encouraged. Exercise at least 150 minutes per week and increase as tolerated. Goal BMI > 25. Stress management encouraged. Avoid nicotine and tobacco product use. Avoid excessive alcohol and NSAID's. Avoid more than 2000 mg of sodium daily. Medications as prescribed. Follow up as scheduled.  -     BMP8+EGFR     Continue all other maintenance medications.  Follow up plan: Return in 3 months (on 12/25/2022), or if symptoms worsen or fail to improve, for DM, schedule AWV.   Continue healthy lifestyle choices, including diet (rich in fruits, vegetables, and lean proteins, and low in salt and simple carbohydrates) and exercise (at least 30 minutes of moderate physical activity daily).  Educational handout given for DASH diet, HTN  The above assessment and management plan was discussed with the patient. The patient verbalized understanding of and has agreed to the management plan. Patient is aware to call the clinic if they develop any new symptoms or if symptoms persist or worsen. Patient is aware when to return to the clinic for a follow-up visit. Patient educated on when it is appropriate to go to the emergency department.   Monia Pouch, FNP-C [facility] [phone]

## 2022-09-26 LAB — BMP8+EGFR
BUN/Creatinine Ratio: 26 (ref 12–28)
BUN: 19 mg/dL (ref 8–27)
CO2: 22 mmol/L (ref 20–29)
Calcium: 9.6 mg/dL (ref 8.7–10.3)
Chloride: 105 mmol/L (ref 96–106)
Creatinine, Ser: 0.74 mg/dL (ref 0.57–1.00)
Glucose: 154 mg/dL — ABNORMAL HIGH (ref 70–99)
Potassium: 4.6 mmol/L (ref 3.5–5.2)
Sodium: 145 mmol/L — ABNORMAL HIGH (ref 134–144)
eGFR: 87 mL/min/{1.73_m2} (ref >59)

## 2022-09-30 ENCOUNTER — Ambulatory Visit (INDEPENDENT_AMBULATORY_CARE_PROVIDER_SITE_OTHER): Payer: Medicare Other

## 2022-09-30 VITALS — Ht 61.0 in | Wt 140.0 lb

## 2022-09-30 DIAGNOSIS — Z0000 Encounter for general adult medical examination without abnormal findings: Secondary | ICD-10-CM

## 2022-09-30 NOTE — Patient Instructions (Signed)
Dawn Anthony , Thank you for taking time to come for your Medicare Wellness Visit. I appreciate your ongoing commitment to your health goals. Please review the following plan we discussed and let me know if I can assist you in the future.   These are the goals we discussed:  Goals      CCM (DIABETES) EXPECTED OUTCOME: MONITOR, SELF- MANAGE AND REDUCE SYMPTOMS OF DIABETES     Current Barriers:  Knowledge Deficits related to Diabetes management Chronic Disease Management support and education needs related to Diabetes, diet and nutrition No Advanced Directives in place- pt requests information, documents mailed Patient will be working with pharmacist Patient reports CBG is monitored by CGM [employer] with fasting ranges 101-102, random ranges 200-220, pt states she did not take her insulin for a month because she fractured her right arm in a fall on 06/25/22, her grandson did not want to do the injection, pt feels this is why her AIC went up to 9.8, pt states she is working on her diet, walking daily and is working towards goal of lowering AIC to below 7. Patient reports she saw a dietician in the past and wants to wait and see if she can get AIC down on her own, then if she cannot will revisit the idea of seeing a nutritionist  Planned Interventions: Provided education to patient about basic DM disease process; Reviewed medications with patient and discussed importance of medication adherence;        Reviewed prescribed diet with patient carbohydrate modified; Counseled on importance of regular laboratory monitoring as prescribed;        Discussed plans with patient for ongoing care management follow up and provided patient with direct contact information for care management team;      Provided patient with written educational materials related to hypo and hyperglycemia and importance of correct treatment;       Reviewed scheduled/upcoming provider appointments including: pharmD on 09/19/22 at  11 am, primary care provider 09/25/22 at 850 am;         Advised patient, providing education and rationale, to check cbg per CGM  and record        Review of patient status, including review of consultants reports, relevant laboratory and other test results, and medications completed;       Screening for signs and symptoms of depression related to chronic disease state;        Assessed social determinant of health barriers;        Reviewed fasting blood sugar goals of 80-130 and less than 180 1 1/2-2 hours after meals Reviewed safety precautions  Symptom Management: Take medications as prescribed   Attend all scheduled provider appointments Call pharmacy for medication refills 3-7 days in advance of running out of medications Attend church or other social activities Perform all self care activities independently  Call provider office for new concerns or questions  check blood sugar at prescribed times: per [facility]  check feet daily for cuts, sores or redness enter blood sugar readings and medication or insulin into daily log take the blood sugar log to all doctor visits fill half of plate with vegetables limit fast food meals to no more than 1 per week manage portion size prepare main meal at home 3 to 5 days each week read food labels for fat, fiber, carbohydrates and portion size keep feet up while sitting wash and dry feet carefully every day wear comfortable, cotton socks wear comfortable, well-fitting shoes Goal  for blood sugars- fasting blood sugar goals of 80-130 and less than 180 1 1/2-2 hours after meals fall prevention strategies: change position slowly, use assistive device such as walker or cane (per provider recommendations) when walking, keep walkways clear, have good lighting in room. It is important to contact your provider if you have any falls, maintain muscle strength/tone by exercise per provider recommendations.  Follow Up Plan: Telephone follow up  appointment with care management team member scheduled for:  11/04/22 at 130 pm       CCM (HYPERTENSION) EXPECTED OUTCOME: MONITOR, SELF-MANAGE AND REDUCE SYMPTOMS OF HYPERTENSION     Current Barriers:  Knowledge Deficits related to Hypertension management Care Coordination needs related to pharmacy/ medication management in a patient with Hypertension Chronic Disease Management support and education needs related to Hypertension, diet No Advanced Directives in place- pt requests information be mailed Patient reports her 17 year old grandson lives with her, provides transportation as pt is currently not driving, pt states she is independent with all aspects of her care, has a shower seat to make bathing easier. Patient reports she has blood pressure cuff and does not monitor blood pressure but is willing to start Patient reports she has started walking daily for 30 minutes  Planned Interventions: Evaluation of current treatment plan related to hypertension self management and patient's adherence to plan as established by provider;   Reviewed prescribed diet low sodium Reviewed medications with patient and discussed importance of compliance;  Counseled on the importance of exercise goals with target of 150 minutes per week Discussed plans with patient for ongoing care management follow up and provided patient with direct contact information for care management team; Advised patient, providing education and rationale, to monitor blood pressure daily and record, calling PCP for findings outside established parameters;  Provided education on prescribed diet low sodium;  Discussed complications of poorly controlled blood pressure such as heart disease, stroke, circulatory complications, vision complications, kidney impairment, sexual dysfunction;  Screening for signs and symptoms of depression related to chronic disease state;  Assessed social determinant of health barriers;  Advanced directive  information mailed  Symptom Management: Take medications as prescribed   Attend all scheduled provider appointments Call pharmacy for medication refills 3-7 days in advance of running out of medications Attend church or other social activities Perform all self care activities independently  Call provider office for new concerns or questions  check blood pressure weekly choose a place to take my blood pressure (home, clinic or office, retail store) write blood pressure results in a log or diary learn about high blood pressure keep a blood pressure log take blood pressure log to all doctor appointments call doctor for signs and symptoms of high blood pressure develop an action plan for high blood pressure take medications for blood pressure exactly as prescribed report new symptoms to your doctor eat more whole grains, fruits and vegetables, lean meats and healthy fats Look over education sent via my chart- low sodium diet Advanced directives packet mailed- please look over and complete Continue walking daily- keep up the good work!  Follow Up Plan: Telephone follow up appointment with care management team member scheduled for: 11/04/22 at 130 pm        Exercise 3x per week (30 min per time)        This is a list of the screening recommended for you and due dates:  Health Maintenance  Topic Date Due   Complete foot exam   Never done  Colon Cancer Screening  Never done   Mammogram  Never done   Zoster (Shingles) Vaccine (1 of 2) Never done   DEXA scan (bone density measurement)  Never done   COVID-19 Vaccine (4 - 2023-24 season) 10/11/2022*   Hepatitis C Screening: USPSTF Recommendation to screen - Ages 18-79 yo.  09/25/2023*   Flu Shot  01/23/2023   Hemoglobin A1C  02/28/2023   Yearly kidney health urinalysis for diabetes  08/28/2023   Eye exam for diabetics  08/28/2023   Yearly kidney function blood test for diabetes  09/25/2023   Medicare Annual Wellness Visit   09/30/2023   DTaP/Tdap/Td vaccine (3 - Td or Tdap) 04/25/2027   Pneumonia Vaccine  Completed   HPV Vaccine  Aged Out  *Topic was postponed. The date shown is not the original due date.    Advanced directives: Advance directive discussed with you today. I have provided a copy for you to complete at home and have notarized. Once this is complete please bring a copy in to our office so we can scan it into your chart.   Conditions/risks identified: Aim for 30 minutes of exercise or brisk walking, 6-8 glasses of water, and 5 servings of fruits and vegetables each day.   Next appointment: Follow up in one year for your annual wellness visit    Preventive Care 65 Years and Older, Female Preventive care refers to lifestyle choices and visits with your health care provider that can promote health and wellness. What does preventive care include? A yearly physical exam. This is also called an annual well check. Dental exams once or twice a year. Routine eye exams. Ask your health care provider how often you should have your eyes checked. Personal lifestyle choices, including: Daily care of your teeth and gums. Regular physical activity. Eating a healthy diet. Avoiding tobacco and drug use. Limiting alcohol use. Practicing safe sex. Taking low-dose aspirin every day. Taking vitamin and mineral supplements as recommended by your health care provider. What happens during an annual well check? The services and screenings done by your health care provider during your annual well check will depend on your age, overall health, lifestyle risk factors, and family history of disease. Counseling  Your health care provider may ask you questions about your: Alcohol use. Tobacco use. Drug use. Emotional well-being. Home and relationship well-being. Sexual activity. Eating habits. History of falls. Memory and ability to understand (cognition). Work and work Astronomer. Reproductive  health. Screening  You may have the following tests or measurements: Height, weight, and BMI. Blood pressure. Lipid and cholesterol levels. These may be checked every 5 years, or more frequently if you are over 40 years old. Skin check. Lung cancer screening. You may have this screening every year starting at age 67 if you have a 30-pack-year history of smoking and currently smoke or have quit within the past 15 years. Fecal occult blood test (FOBT) of the stool. You may have this test every year starting at age 4. Flexible sigmoidoscopy or colonoscopy. You may have a sigmoidoscopy every 5 years or a colonoscopy every 10 years starting at age 57. Hepatitis C blood test. Hepatitis B blood test. Sexually transmitted disease (STD) testing. Diabetes screening. This is done by checking your blood sugar (glucose) after you have not eaten for a while (fasting). You may have this done every 1-3 years. Bone density scan. This is done to screen for osteoporosis. You may have this done starting at age 54. Mammogram. This may be  done every 1-2 years. Talk to your health care provider about how often you should have regular mammograms. Talk with your health care provider about your test results, treatment options, and if necessary, the need for more tests. Vaccines  Your health care provider may recommend certain vaccines, such as: Influenza vaccine. This is recommended every year. Tetanus, diphtheria, and acellular pertussis (Tdap, Td) vaccine. You may need a Td booster every 10 years. Zoster vaccine. You may need this after age 560. Pneumococcal 13-valent conjugate (PCV13) vaccine. One dose is recommended after age 70. Pneumococcal polysaccharide (PPSV23) vaccine. One dose is recommended after age 70. Talk to your health care provider about which screenings and vaccines you need and how often you need them. This information is not intended to replace advice given to you by your health care provider.  Make sure you discuss any questions you have with your health care provider. Document Released: 07/07/2015 Document Revised: 02/28/2016 Document Reviewed: 04/11/2015 Elsevier Interactive Patient Education  2017 [employer]  Fall Prevention in the Home Falls can cause injuries. They can happen to people of all ages. There are many things you can do to make your home safe and to help prevent falls. What can I do on the outside of my home? Regularly fix the edges of walkways and driveways and fix any cracks. Remove anything that might make you trip as you walk through a door, such as a raised step or threshold. Trim any bushes or trees on the path to your home. Use bright outdoor lighting. Clear any walking paths of anything that might make someone trip, such as rocks or tools. Regularly check to see if handrails are loose or broken. Make sure that both sides of any steps have handrails. Any raised decks and porches should have guardrails on the edges. Have any leaves, snow, or ice cleared regularly. Use sand or salt on walking paths during winter. Clean up any spills in your garage right away. This includes oil or grease spills. What can I do in the bathroom? Use night lights. Install grab bars by the toilet and in the tub and shower. Do not use towel bars as grab bars. Use non-skid mats or decals in the tub or shower. If you need to sit down in the shower, use a plastic, non-slip stool. Keep the floor dry. Clean up any water that spills on the floor as soon as it happens. Remove soap buildup in the tub or shower regularly. Attach bath mats securely with double-sided non-slip rug tape. Do not have throw rugs and other things on the floor that can make you trip. What can I do in the bedroom? Use night lights. Make sure that you have a light by your bed that is easy to reach. Do not use any sheets or blankets that are too big for your bed. They should not hang down onto the floor. Have a  firm chair that has side arms. You can use this for support while you get dressed. Do not have throw rugs and other things on the floor that can make you trip. What can I do in the kitchen? Clean up any spills right away. Avoid walking on wet floors. Keep items that you use a lot in easy-to-reach places. If you need to reach something above you, use a strong step stool that has a grab bar. Keep electrical cords out of the way. Do not use floor polish or wax that makes floors slippery. If you must use wax,  use non-skid floor wax. Do not have throw rugs and other things on the floor that can make you trip. What can I do with my stairs? Do not leave any items on the stairs. Make sure that there are handrails on both sides of the stairs and use them. Fix handrails that are broken or loose. Make sure that handrails are as long as the stairways. Check any carpeting to make sure that it is firmly attached to the stairs. Fix any carpet that is loose or worn. Avoid having throw rugs at the top or bottom of the stairs. If you do have throw rugs, attach them to the floor with carpet tape. Make sure that you have a light switch at the top of the stairs and the bottom of the stairs. If you do not have them, ask someone to add them for you. What else can I do to help prevent falls? Wear shoes that: Do not have high heels. Have rubber bottoms. Are comfortable and fit you well. Are closed at the toe. Do not wear sandals. If you use a stepladder: Make sure that it is fully opened. Do not climb a closed stepladder. Make sure that both sides of the stepladder are locked into place. Ask someone to hold it for you, if possible. Clearly mark and make sure that you can see: Any grab bars or handrails. First and last steps. Where the edge of each step is. Use tools that help you move around (mobility aids) if they are needed. These include: Canes. Walkers. Scooters. Crutches. Turn on the lights when you  go into a dark area. Replace any light bulbs as soon as they burn out. Set up your furniture so you have a clear path. Avoid moving your furniture around. If any of your floors are uneven, fix them. If there are any pets around you, be aware of where they are. Review your medicines with your doctor. Some medicines can make you feel dizzy. This can increase your chance of falling. Ask your doctor what other things that you can do to help prevent falls. This information is not intended to replace advice given to you by your health care provider. Make sure you discuss any questions you have with your health care provider. Document Released: 04/06/2009 Document Revised: 11/16/2015 Document Reviewed: 07/15/2014 Elsevier Interactive Patient Education  2017 [employer].

## 2022-09-30 NOTE — Progress Notes (Signed)
Subjective:   Dawn Anthony is a 72 y.o. female who presents for an Initial Medicare Annual Wellness Visit. I connected with  [name] on 09/30/22 by a audio enabled telemedicine application and verified that I am speaking with the correct person using two identifiers.  Patient Location: Home  Provider Location: Home Office  I discussed the limitations of evaluation and management by telemedicine. The patient expressed understanding and agreed to proceed.  Review of Systems     Cardiac Risk Factors include: advanced age (>12men, >49 women);diabetes mellitus;dyslipidemia;hypertension     Objective:    Today's Vitals   09/30/22 1343  Weight: 140 lb (63.5 kg)  Height: 5\' 1"  (1.549 m)   Body mass index is 26.45 kg/m.     09/30/2022    1:46 PM 09/04/2022    1:07 PM 06/25/2022    8:15 PM 07/09/2019    1:58 PM 07/06/2019    1:02 PM 09/23/2018    6:10 PM 09/23/2018   11:48 AM  Advanced Directives  Does Patient Have a Medical Advance Directive? No No No No No  No  Would patient like information on creating a medical advance directive? No - Patient declined Yes (MAU/Ambulatory/Procedural Areas - Information given)   No - Patient declined No - Patient declined     Current Medications (verified) Outpatient Encounter Medications as of 09/30/2022  Medication Sig   aspirin EC 81 MG tablet Take 81 mg by mouth daily. Swallow whole.   carvedilol (COREG) 12.5 MG tablet Take 12.5 mg by mouth 2 (two) times daily with a meal.   cholecalciferol (VITAMIN D3) 25 MCG (1000 UT) tablet Take 50 Units by mouth daily.   Continuous Blood Gluc Receiver (FREESTYLE LIBRE 14 DAY READER) DEVI    empagliflozin (JARDIANCE) 25 MG TABS tablet Take 25 mg by mouth daily.   insulin degludec (TRESIBA FLEXTOUCH) 100 UNIT/ML FlexTouch Pen Inject 20 Units into the skin daily.   insulin lispro (HUMALOG KWIKPEN) 100 UNIT/ML KwikPen Inject 100 Units into the skin. Am- 6 units Lunch 8 units  PM- 10 units   Insulin  Pen Needle (BD PEN NEEDLE NANO U/F) 32G X 4 MM MISC UAD QID Dx E11.69   levothyroxine (SYNTHROID, LEVOTHROID) 75 MCG tablet Take 75 mcg by mouth daily before breakfast.   lisinopril (ZESTRIL) 5 MG tablet Take 1 tablet (5 mg total) by mouth daily.   metFORMIN (GLUCOPHAGE) 1000 MG tablet Take 1,000 mg by mouth 2 (two) times daily with a meal.   rosuvastatin (CRESTOR) 20 MG tablet Take 20 mg by mouth daily.   No facility-administered encounter medications on file as of 09/30/2022.    Allergies (verified) Hydrochlorothiazide, Insulin glargine-lixisenatide, Simvastatin, Penicillins, Amoxicillin, Cat hair extract, and Latex   History: Past Medical History:  Diagnosis Date   Diabetes mellitus without complication    Gastritis    Hyperlipidemia    Hypertension    Thyroid disease    Past Surgical History:  Procedure Laterality Date   CORONARY ARTERY BYPASS GRAFT  2022   EYE SURGERY     KNEE SURGERY Bilateral    OTHER SURGICAL HISTORY     shoulder surgery   THYROID SURGERY     Family History  Problem Relation Age of Onset   Diabetes Mother    Heart disease Mother    Heart failure Mother    Heart attack Father    Heart disease Father    COPD Father    Diabetes Father    Dementia Sister  Heart attack Sister    Hypertension Sister    Diabetes Brother    Heart attack Brother    Alcohol abuse Brother    Cervical cancer Maternal Grandmother    Diabetes Paternal Grandfather    Social History   Socioeconomic History   Marital status: Widowed    Spouse name: Not on file   Number of children: 1   Years of education: Not on file   Highest education level: 12th grade  Occupational History   Not on file  Tobacco Use   Smoking status: Never   Smokeless tobacco: Never  Vaping Use   Vaping Use: Never used  Substance and Sexual Activity   Alcohol use: Never   Drug use: Never   Sexual activity: Not Currently  Other Topics Concern   Not on file  Social History Narrative   Not  on file   Social Determinants of Health   Financial Resource Strain: Low Risk  (09/30/2022)   Overall Financial Resource Strain (CARDIA)    Difficulty of Paying Living Expenses: Not hard at all  Food Insecurity: No Food Insecurity (09/30/2022)   Hunger Vital Sign    Worried About Running Out of Food in the Last Year: Never true    Ran Out of Food in the Last Year: Never true  Recent Concern: Food Insecurity - Food Insecurity Present (09/23/2022)   Hunger Vital Sign    Worried About Running Out of Food in the Last Year: Sometimes true    Ran Out of Food in the Last Year: Sometimes true  Transportation Needs: No Transportation Needs (09/30/2022)   PRAPARE - Administrator, Civil Service (Medical): No    Lack of Transportation (Non-Medical): No  Physical Activity: Insufficiently Active (09/30/2022)   Exercise Vital Sign    Days of Exercise per Week: 3 days    Minutes of Exercise per Session: 30 min  Stress: No Stress Concern Present (09/30/2022)   [employer] of Occupational Health - Occupational Stress Questionnaire    Feeling of Stress : Not at all  Social Connections: Socially Isolated (09/30/2022)   Social Connection and Isolation Panel [NHANES]    Frequency of Communication with Friends and Family: More than three times a week    Frequency of Social Gatherings with Friends and Family: More than three times a week    Attends Religious Services: Never    Database administrator or Organizations: No    Attends Banker Meetings: Never    Marital Status: Widowed    Tobacco Counseling Counseling given: Not Answered   Clinical Intake:  Pre-visit preparation completed: Yes  Pain : No/denies pain     Nutritional Risks: None Diabetes: Yes CBG done?: No Did pt. bring in CBG monitor from home?: No  How often do you need to have someone help you when you read instructions, pamphlets, or other written materials from your doctor or pharmacy?: 1 -  Never  Diabetic?yes  Nutrition Risk Assessment:  Has the patient had any N/V/D within the last 2 months?  No  Does the patient have any non-healing wounds?  No  Has the patient had any unintentional weight loss or weight gain?  No   Diabetes:  Is the patient diabetic?  Yes  If diabetic, was a CBG obtained today?  No  Did the patient bring in their glucometer from home?  No  How often do you monitor your CBG's? Libre .   Financial Strains and Diabetes Management:  Are you having any financial strains with the device, your supplies or your medication? No .  Does the patient want to be seen by Chronic Care Management for management of their diabetes?  No  Would the patient like to be referred to a Nutritionist or for Diabetic Management?  No   Diabetic Exams:  Diabetic Eye Exam: Completed 09/10/2022 Diabetic Foot Exam: Overdue, Pt has been advised about the importance in completing this exam. Pt is scheduled for diabetic foot exam on next office visit .   Interpreter Needed?: No  Information entered by :: Renie Ora, LPN   Activities of Daily Living    09/30/2022    1:46 PM 09/27/2022    3:06 PM  In your present state of health, do you have any difficulty performing the following activities:  Hearing? 0 0  Vision? 0 0  Difficulty concentrating or making decisions? 0 0  Walking or climbing stairs? 0 0  Dressing or bathing? 0 0  Doing errands, shopping? 0 0  Preparing Food and eating ? N N  Using the Toilet? N N  In the past six months, have you accidently leaked urine? N Y  Do you have problems with loss of bowel control? N N  Managing your Medications? N N  Managing your Finances? N N  Housekeeping or managing your Housekeeping? N N    Patient Care Team: Sonny Masters, FNP as PCP - General (Family Medicine) Audrie Gallus, RN as [facility] Care Management  Indicate any recent Medical Services you may have received from other than [facility] providers in  the past year (date may be approximate).     Assessment:   This is a routine wellness examination for Dawn Anthony.  Hearing/Vision screen Vision Screening - Comments:: Wears rx glasses - up to date with routine eye exams with  Dr.Lee   Dietary issues and exercise activities discussed: Current Exercise Habits: Home exercise routine, Type of exercise: walking, Time (Minutes): 30, Frequency (Times/Week): 3, Weekly Exercise (Minutes/Week): 90, Intensity: Mild, Exercise limited by: orthopedic condition(s)   Goals Addressed             This Visit's Progress    Exercise 3x per week (30 min per time)         Depression Screen    09/30/2022    1:45 PM 09/25/2022    8:45 AM 09/04/2022   12:57 PM 08/28/2022    3:41 PM  PHQ 2/9 Scores  PHQ - 2 Score 0 0 0 0  PHQ- 9 Score 0 2  4    Fall Risk    09/30/2022    1:44 PM 09/27/2022    3:06 PM 09/25/2022    8:44 AM 09/04/2022    1:02 PM 08/28/2022    3:41 PM  Fall Risk   Falls in the past year? 1 1 1 1 1   Number falls in past yr: 1 0 1 1 0  Injury with Fall? 1 1 1 1 1   Risk for fall due to : History of fall(s);Impaired balance/gait;Orthopedic patient   History of fall(s)   Follow up Education provided;Falls prevention discussed  Falls prevention discussed Falls evaluation completed;Education provided;Falls prevention discussed Falls prevention discussed    FALL RISK PREVENTION PERTAINING TO THE HOME:  Any stairs in or around the home? No  If so, are there any without handrails? No  Home free of loose throw rugs in walkways, pet beds, electrical cords, etc? Yes  Adequate lighting in your  home to reduce risk of falls? Yes   ASSISTIVE DEVICES UTILIZED TO PREVENT FALLS:  Life alert? No  Use of a cane, walker or w/c? No  Grab bars in the bathroom? Yes  Shower chair or bench in shower? No  Elevated toilet seat or a handicapped toilet? No          09/30/2022    1:46 PM  6CIT Screen  What Year? 0 points  What month? 0 points  What time? 0  points  Count back from 20 0 points  Months in reverse 0 points  Repeat phrase 0 points  Total Score 0 points    Immunizations Immunization History  Administered Date(s) Administered   Influenza Split 04/24/2013, 03/07/2017, 03/27/2018   Influenza, High Dose Seasonal PF 03/08/2021, 03/08/2021   Influenza, Quadrivalent, Recombinant, Inj, Pf 03/08/2021   Influenza, Seasonal, Injecte, Preservative Fre 04/27/2015, 02/28/2017   Influenza,inj,Quad PF,6+ Mos 04/02/2020   Influenza,trivalent, recombinat, inj, PF 04/24/2013, 03/07/2017, 03/27/2018   Influenza-Unspecified 04/27/2015, 02/28/2017, 03/08/2021   Moderna Sars-Covid-2 Vaccination 11/01/2019, 12/09/2019   PFIZER(Purple Top)SARS-COV-2 Vaccination 06/13/2020   Pneumococcal Conjugate-13 04/07/2018   Pneumococcal Polysaccharide-23 11/21/2007, 10/25/2019   Tdap 12/10/2006, 04/24/2017   Zoster, Live 03/22/2013    TDAP status: Up to date  Flu Vaccine status: Up to date  Pneumococcal vaccine status: Up to date  Covid-19 vaccine status: Completed vaccines  Qualifies for Shingles Vaccine? Yes   Zostavax completed No   Shingrix Completed?: No.    Education has been provided regarding the importance of this vaccine. Patient has been advised to call insurance company to determine out of pocket expense if they have not yet received this vaccine. Advised may also receive vaccine at local pharmacy or Health Dept. Verbalized acceptance and understanding.  Screening Tests Health Maintenance  Topic Date Due   FOOT EXAM  Never done   COLONOSCOPY (Pts 45-7087yrs Insurance coverage will need to be confirmed)  Never done   MAMMOGRAM  Never done   Zoster Vaccines- Shingrix (1 of 2) Never done   DEXA SCAN  Never done   COVID-19 Vaccine (4 - 2023-24 season) 10/11/2022 (Originally 02/22/2022)   Hepatitis C Screening  09/25/2023 (Originally 07/21/1968)   INFLUENZA VACCINE  01/23/2023   HEMOGLOBIN A1C  02/28/2023   Diabetic kidney evaluation - Urine  ACR  08/28/2023   OPHTHALMOLOGY EXAM  08/28/2023   Diabetic kidney evaluation - eGFR measurement  09/25/2023   Medicare Annual Wellness (AWV)  09/30/2023   DTaP/Tdap/Td (3 - Td or Tdap) 04/25/2027   Pneumonia Vaccine 7065+ Years old  Completed   HPV VACCINES  Aged Out    Health Maintenance  Health Maintenance Due  Topic Date Due   FOOT EXAM  Never done   COLONOSCOPY (Pts 45-2487yrs Insurance coverage will need to be confirmed)  Never done   MAMMOGRAM  Never done   Zoster Vaccines- Shingrix (1 of 2) Never done   DEXA SCAN  Never done    Colorectal cancer screening: Referral to GI placed will discuss with PCP will have records sent . Pt aware the office will call re: appt.  Mammogram status: Ordered states completed in [place] will have records sent . Pt provided with contact info and advised to call to schedule appt.   Bone Density status: Ordered declines . Pt provided with contact info and advised to call to schedule appt.  Lung Cancer Screening: (Low Dose CT Chest recommended if Age 38-80 years, 30 pack-year currently smoking OR have quit w/in 15years.) does  not qualify.   Lung Cancer Screening Referral: n/a  Additional Screening:  Hepatitis C Screening: does not qualify;  Vision Screening: Recommended annual ophthalmology exams for early detection of glaucoma and other disorders of the eye. Is the patient up to date with their annual eye exam?  Yes  Who is the provider or what is the name of the office in which the patient attends annual eye exams? Dr.Lee  If pt is not established with a provider, would they like to be referred to a provider to establish care? No .   Dental Screening: Recommended annual dental exams for proper oral hygiene  Community Resource Referral / Chronic Care Management: CRR required this visit?  No   CCM required this visit?  No      Plan:     I have personally reviewed and noted the following in the patient's chart:   Medical and social  history Use of alcohol, tobacco or illicit drugs  Current medications and supplements including opioid prescriptions. Patient is not currently taking opioid prescriptions. Functional ability and status Nutritional status Physical activity Advanced directives List of other physicians Hospitalizations, surgeries, and ER visits in previous 12 months Vitals Screenings to include cognitive, depression, and falls Referrals and appointments  In addition, I have reviewed and discussed with patient certain preventive protocols, quality metrics, and best practice recommendations. A written personalized care plan for preventive services as well as general preventive health recommendations were provided to patient.     Lorrene Reid, LPN   09/29/759   Nurse Notes: will discuss health maintenance with Pcp and have medical records sent from [place]

## 2022-10-01 ENCOUNTER — Encounter: Payer: Self-pay | Admitting: Nurse Practitioner

## 2022-10-02 ENCOUNTER — Encounter: Payer: Self-pay | Admitting: Nurse Practitioner

## 2022-10-02 ENCOUNTER — Ambulatory Visit: Payer: Medicare Other | Admitting: Nurse Practitioner

## 2022-10-02 VITALS — BP 120/82 | HR 81 | Ht 61.0 in | Wt 142.6 lb

## 2022-10-02 DIAGNOSIS — Z794 Long term (current) use of insulin: Secondary | ICD-10-CM | POA: Diagnosis not present

## 2022-10-02 DIAGNOSIS — E890 Postprocedural hypothyroidism: Secondary | ICD-10-CM | POA: Diagnosis not present

## 2022-10-02 DIAGNOSIS — E1165 Type 2 diabetes mellitus with hyperglycemia: Secondary | ICD-10-CM

## 2022-10-02 NOTE — Patient Instructions (Signed)

## 2022-10-02 NOTE — Progress Notes (Signed)
Endocrinology Consult Note       10/02/2022, 3:19 PM   Subjective:    Patient ID: Dawn Anthony, female    DOB: 29-Apr-1951.  Dawn Anthony is being seen in consultation for management of currently uncontrolled symptomatic diabetes requested by  Sonny Masters, FNP.   Past Medical History:  Diagnosis Date   Diabetes mellitus without complication    Gastritis    Hyperlipidemia    Hypertension    Thyroid disease     Past Surgical History:  Procedure Laterality Date   CORONARY ARTERY BYPASS GRAFT  2022   EYE SURGERY     KNEE SURGERY Bilateral    OTHER SURGICAL HISTORY     shoulder surgery   THYROID SURGERY      Social History   Socioeconomic History   Marital status: Widowed    Spouse name: Not on file   Number of children: 1   Years of education: Not on file   Highest education level: 12th grade  Occupational History   Not on file  Tobacco Use   Smoking status: Never   Smokeless tobacco: Never  Vaping Use   Vaping Use: Never used  Substance and Sexual Activity   Alcohol use: Never   Drug use: Never   Sexual activity: Not Currently  Other Topics Concern   Not on file  Social History Narrative   Not on file   Social Determinants of Health   Financial Resource Strain: Low Risk  (09/30/2022)   Overall Financial Resource Strain (CARDIA)    Difficulty of Paying Living Expenses: Not hard at all  Food Insecurity: No Food Insecurity (09/30/2022)   Hunger Vital Sign    Worried About Running Out of Food in the Last Year: Never true    Ran Out of Food in the Last Year: Never true  Recent Concern: Food Insecurity - Food Insecurity Present (09/23/2022)   Hunger Vital Sign    Worried About Running Out of Food in the Last Year: Sometimes true    Ran Out of Food in the Last Year: Sometimes true  Transportation Needs: No Transportation Needs (09/30/2022)   PRAPARE - Scientist, research (physical sciences) (Medical): No    Lack of Transportation (Non-Medical): No  Physical Activity: Insufficiently Active (09/30/2022)   Exercise Vital Sign    Days of Exercise per Week: 3 days    Minutes of Exercise per Session: 30 min  Stress: No Stress Concern Present (09/30/2022)   [employer] of Occupational Health - Occupational Stress Questionnaire    Feeling of Stress : Not at all  Social Connections: Socially Isolated (09/30/2022)   Social Connection and Isolation Panel [NHANES]    Frequency of Communication with Friends and Family: More than three times a week    Frequency of Social Gatherings with Friends and Family: More than three times a week    Attends Religious Services: Never    Database administrator or Organizations: No    Attends Banker Meetings: Never    Marital Status: Widowed    Family History  Problem Relation Age of Onset   Diabetes Mother    Heart disease Mother  Heart failure Mother    Heart attack Father    Heart disease Father    COPD Father    Diabetes Father    Dementia Sister    Heart attack Sister    Hypertension Sister    Diabetes Brother    Heart attack Brother    Alcohol abuse Brother    Cervical cancer Maternal Grandmother    Diabetes Paternal Grandfather     Outpatient Encounter Medications as of 10/02/2022  Medication Sig   aspirin EC 81 MG tablet Take 81 mg by mouth daily. Swallow whole.   carvedilol (COREG) 12.5 MG tablet Take 12.5 mg by mouth 2 (two) times daily with a meal.   cholecalciferol (VITAMIN D3) 25 MCG (1000 UT) tablet Take 50 Units by mouth daily.   Continuous Blood Gluc Receiver (FREESTYLE LIBRE 14 DAY READER) DEVI    insulin degludec (TRESIBA FLEXTOUCH) 100 UNIT/ML FlexTouch Pen Inject 15 Units into the skin at bedtime.   insulin lispro (HUMALOG KWIKPEN) 100 UNIT/ML KwikPen Inject 4-10 Units into the skin 3 (three) times daily.   Insulin Pen Needle (BD PEN NEEDLE NANO U/F) 32G X 4 MM MISC UAD QID Dx E11.69    levothyroxine (SYNTHROID, LEVOTHROID) 75 MCG tablet Take 75 mcg by mouth daily before breakfast.   lisinopril (ZESTRIL) 5 MG tablet Take 1 tablet (5 mg total) by mouth daily.   metFORMIN (GLUCOPHAGE) 1000 MG tablet Take 1,000 mg by mouth 2 (two) times daily with a meal.   rosuvastatin (CRESTOR) 20 MG tablet Take 20 mg by mouth daily.   [DISCONTINUED] empagliflozin (JARDIANCE) 25 MG TABS tablet Take 25 mg by mouth daily.   No facility-administered encounter medications on file as of 10/02/2022.    ALLERGIES: Allergies  Allergen Reactions   Hydrochlorothiazide     Other Reaction(s): Unknown-Unspecified  Stopped due to pancreatitis   Insulin Glargine-Lixisenatide Nausea And Vomiting   Simvastatin     Other Reaction(s): Unknown-Unspecified  Stopped due to pancreatitis  Stopped due to pancreatitis   Penicillins Hives   Amoxicillin Itching and Rash   Cat Hair Extract Itching and Rash   Latex Other (See Comments) and Rash    VACCINATION STATUS: Immunization History  Administered Date(s) Administered   Influenza Split 04/24/2013, 03/07/2017, 03/27/2018   Influenza, High Dose Seasonal PF 03/08/2021, 03/08/2021   Influenza, Quadrivalent, Recombinant, Inj, Pf 03/08/2021   Influenza, Seasonal, Injecte, Preservative Fre 04/27/2015, 02/28/2017   Influenza,inj,Quad PF,6+ Mos 04/02/2020   Influenza,trivalent, recombinat, inj, PF 04/24/2013, 03/07/2017, 03/27/2018   Influenza-Unspecified 04/27/2015, 02/28/2017, 03/08/2021   Moderna Sars-Covid-2 Vaccination 11/01/2019, 12/09/2019   PFIZER(Purple Top)SARS-COV-2 Vaccination 06/13/2020   Pneumococcal Conjugate-13 04/07/2018   Pneumococcal Polysaccharide-23 11/21/2007, 10/25/2019   Tdap 12/10/2006, 04/24/2017   Zoster, Live 03/22/2013    Diabetes She presents for her initial diabetic visit. Onset time: diagnosed at approx age of 72. Her disease course has been improving. Hypoglycemia symptoms include nervousness/anxiousness, sweats and  tremors. There are no diabetic associated symptoms. There are no hypoglycemic complications. 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 Risk factors for coronary artery disease include diabetes mellitus, dyslipidemia, family history, hypertension, sedentary lifestyle and post-menopausal. Current diabetic treatment includes intensive insulin program and oral agent (dual therapy). She is compliant with treatment most of the time. Her weight is fluctuating minimally. She is following a generally healthy diet. When asked about meal planning, she reported none. She has not had a previous visit with a dietitian. She participates in exercise intermittently. Her home blood glucose  trend is decreasing steadily. Her overall blood glucose range is 140-180 mg/dl. (She presents today for her consultation, accompanied by her grandson, with her CGM showing at goal glycemic profile overall.  Her most recent A1c on 3/6 was 9.8%.  She notes she was under endocrinology care with [facility], moved away to [place], then moved back.  She drinks mostly water and black coffee, eats 3 meals per day (sometimes skipping lunch) and snacks on occasion.  She engages in routine physical activity by walking.  She is UTD on eye exam, has never seen podiatry in the past.  Analysis of her CGM shows TIR 64%, TAR 36%, TBR 0% with a GMI of 7.4%.) An ACE inhibitor/angiotensin II receptor blocker is being taken. She does not see a podiatrist.Eye exam is current.     Review of systems  Constitutional: + Minimally fluctuating body weight, current Body mass index is 26.94 kg/m., no fatigue, no subjective hyperthermia, no subjective hypothermia Eyes: no blurry vision, no xerophthalmia ENT: no sore throat, no nodules palpated in throat, no dysphagia/odynophagia, no hoarseness Cardiovascular: no chest pain, no shortness of breath, no palpitations, no leg swelling Respiratory: no cough, no shortness of breath Gastrointestinal: no  nausea/vomiting/diarrhea Musculoskeletal: no muscle/joint aches Skin: no rashes, no hyperemia Neurological: no tremors, no numbness, no tingling, no dizziness Psychiatric: no depression, no anxiety  Objective:     BP 120/82 (BP Location: Left Arm, Patient Position: Sitting, Cuff Size: Large)   Pulse 81   Ht 5\' 1"  (1.549 m)   Wt 142 lb 9.6 oz (64.7 kg)   BMI 26.94 kg/m   Wt Readings from Last 3 Encounters:  10/02/22 142 lb 9.6 oz (64.7 kg)  09/30/22 140 lb (63.5 kg)  09/25/22 140 lb (63.5 kg)     BP Readings from Last 3 Encounters:  10/02/22 120/82  09/25/22 134/87  08/28/22 (!) 146/89     Physical Exam- Limited  Constitutional:  Body mass index is 26.94 kg/m. , not in acute distress, normal state of mind Eyes:  EOMI, no exophthalmos Neck: Supple Cardiovascular: RRR, no murmurs, rubs, or gallops, no edema Respiratory: Adequate breathing efforts, no crackles, rales, rhonchi, or wheezing Musculoskeletal: no gross deformities, strength intact in all four extremities, no gross restriction of joint movements Skin:  no rashes, no hyperemia Neurological: no tremor with outstretched hands   Diabetic Foot Exam - Simple   Simple Foot Form Diabetic Foot exam was performed with the following findings: Yes 10/02/2022  2:56 PM  Visual Inspection See comments: Yes Sensation Testing Intact to touch and monofilament testing bilaterally: Yes Pulse Check Posterior Tibialis and Dorsalis pulse intact bilaterally: Yes Comments Corn to right food second toe; nailbed injury to left great toe (growing out)     CMP ( most recent) CMP     Component Value Date/Time   NA 145 (H) 09/25/2022 0854   K 4.6 09/25/2022 0854   CL 105 09/25/2022 0854   CO2 22 09/25/2022 0854   GLUCOSE 154 (H) 09/25/2022 0854   GLUCOSE 265 (H) 07/09/2019 1456   BUN 19 09/25/2022 0854   CREATININE 0.74 09/25/2022 0854   CALCIUM 9.6 09/25/2022 0854   PROT 6.6 08/28/2022 1601   ALBUMIN 4.5 08/28/2022 1601    AST 22 08/28/2022 1601   ALT 16 08/28/2022 1601   ALKPHOS 79 08/28/2022 1601   BILITOT 0.3 08/28/2022 1601   GFRNONAA >60 07/09/2019 1456   GFRAA >60 07/09/2019 1456     Diabetic Labs (most recent): Lab Results  Component Value Date   HGBA1C 9.8 (H) 08/28/2022     Lipid Panel ( most recent) Lipid Panel     Component Value Date/Time   CHOL 129 08/28/2022 1601   TRIG 63 08/28/2022 1601   HDL 70 08/28/2022 1601   CHOLHDL 1.8 08/28/2022 1601   LDLCALC 46 08/28/2022 1601   LABVLDL 13 08/28/2022 1601      Lab Results  Component Value Date   TSH 1.530 08/28/2022           Assessment & Plan:   1) Type 2 diabetes mellitus with hyperglycemia, with long-term current use of insulin  She presents today for her consultation, accompanied by her grandson, with her CGM showing at goal glycemic profile overall.  Her most recent A1c on 3/6 was 9.8%.  She notes she was under endocrinology care with [facility], moved away to [place], then moved back.  She drinks mostly water and black coffee, eats 3 meals per day (sometimes skipping lunch) and snacks on occasion.  She engages in routine physical activity by walking.  She is UTD on eye exam, has never seen podiatry in the past.  Analysis of her CGM shows TIR 64%, TAR 36%, TBR 0% with a GMI of 7.4%.  - Dawn Anthony has currently uncontrolled symptomatic type 2 DM since 72 years of age, with most recent A1c of 9.8 %.   -Recent labs reviewed.  - I had a long discussion with her about the progressive nature of diabetes and the pathology behind its complications. -her diabetes is complicated by CAD with CABG and she remains at a high risk for more acute and chronic complications which include CAD, CVA, CKD, retinopathy, and neuropathy. These are all discussed in detail with her.  The following Lifestyle Medicine recommendations according to American College of Lifestyle Medicine [facility]) were discussed and offered to patient and she agrees to  start the journey:  A. Whole Foods, Plant-based plate comprising of fruits and vegetables, plant-based proteins, whole-grain carbohydrates was discussed in detail with the patient.   A list for source of those nutrients were also provided to the patient.  Patient will use only water or unsweetened tea for hydration. B.  The need to stay away from risky substances including alcohol, smoking; obtaining 7 to 9 hours of restorative sleep, at least 150 minutes of moderate intensity exercise weekly, the importance of healthy social connections,  and stress reduction techniques were discussed. C.  A full color page of  Calorie density of various food groups per pound showing examples of each food groups was provided to the patient.  - I have counseled her on diet and weight management by adopting a carbohydrate restricted/protein rich diet. Patient is encouraged to switch to unprocessed or minimally processed complex starch and increased protein intake (animal or plant source), fruits, and vegetables. -  she is advised to stick to a routine mealtimes to eat 3 meals a day and avoid unnecessary snacks (to snack only to correct hypoglycemia).   - she acknowledges that there is a room for improvement in her food and drink choices. - Suggestion is made for her to avoid simple carbohydrates from her diet including Cakes, Sweet Desserts, Ice Cream, Soda (diet and regular), Sweet Tea, Candies, Chips, Cookies, Store Bought Juices, Alcohol in Excess of 1-2 drinks a day, Artificial Sweeteners, Coffee Creamer, and "Sugar-free" Products. This will help patient to have more stable blood glucose profile and potentially avoid unintended weight gain.  - I have approached her  with the following individualized plan to manage her diabetes and patient agrees:   -She is advised to adjust her Evaristo Bury to 15 units SQ nightly (changing from AM administration) and lower her Humalog to 4-10 units TID with meals if glucose is above 90 and  she is eating (Specific instructions on how to titrate insulin dosage based on glucose readings given to patient in writing). She demonstrated her ability to properly use the SSI chart with me today to dose meal time insulin.  -she is encouraged to start monitoring glucose 4 times daily, before meals and before bed, to log their readings on the clinic sheets provided, and bring them to review at follow up appointment in 2 weeks.  - she is warned not to take insulin without proper monitoring per orders. - Adjustment parameters are given to her for hypo and hyperglycemia in writing. - she is encouraged to call clinic for blood glucose levels less than 70 or above 300 mg /dl. - she is advised to continue Metformin 1000 mg po twice daily with meals, therapeutically suitable for patient . - her [place] Pepper will be discontinued to de-escalate her treatment regimen.  - she is not ideal candidate for incretin therapy due to body habitus.   - Specific targets for  A1c; LDL, HDL, and Triglycerides were discussed with the patient.  2) Blood Pressure /Hypertension:  her blood pressure is controlled to target.   she is advised to continue her current medications including Coreg 12.5 mg po twice daily and Lisinopril 5 mg p.o. daily with breakfast.  3) Lipids/Hyperlipidemia:    Review of her recent lipid panel from 08/28/22 showed controlled LDL at 46 .  she is advised to continue Crestor 20 mg daily at bedtime.  Side effects and precautions discussed with her.  4)  Weight/Diet:  her Body mass index is 26.94 kg/m.  -  she is NOT a candidate for weight loss.   Exercise, and detailed carbohydrates information provided  -  detailed on discharge instructions.  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  She is currently on Levothyroxine 75 mcg po daily before breakfast.  Her recent TFTs are consistent with appropriate hormone replacement, she is advised to continue.   -  The correct intake of thyroid hormone (Levothyroxine, Synthroid), is on empty stomach first thing in the morning, with water, separated by at least 30 minutes from breakfast and other medications,  and separated by more than 4 hours from calcium, iron, multivitamins, acid reflux medications (PPIs).  - This medication is a life-long medication and will be needed to correct thyroid hormone imbalances for the rest of your life.  The dose may change from time to time, based on thyroid blood work.  - It is extremely important to be consistent taking this medication, near the same time each morning.  -AVOID TAKING PRODUCTS CONTAINING BIOTIN (commonly found in Hair, Skin, Nails vitamins) AS IT INTERFERES WITH THE VALIDITY OF THYROID FUNCTION BLOOD TESTS.  6) Chronic Care/Health Maintenance: -she is on ACEI/ARB and Statin medications and is encouraged to initiate and continue to follow up with Ophthalmology, Dentist, Podiatrist at least yearly or according to recommendations, and advised to stay away from smoking. I have recommended yearly flu vaccine and pneumonia vaccine at least every 5 years; moderate intensity exercise for up to 150 minutes weekly; and sleep for at least 7 hours a day.  - she is advised to maintain close follow up with Gilford Silvius  M, FNP for primary care needs, as well as her other providers for optimal and coordinated care.   - Time spent in this patient care: 60 min, of which > 50% was spent in counseling her about her diabetes and the rest reviewing her blood glucose logs, discussing her hypoglycemia and hyperglycemia episodes, reviewing her current and previous labs/studies (including abstraction from other facilities) and medications doses and developing a long term treatment plan based on the latest standards of care/guidelines; and documenting her care.    Please refer to Patient Instructions for Blood Glucose Monitoring and Insulin/Medications Dosing Guide" in media tab for  additional information. Please also refer to "Patient Self Inventory" in the Media tab for reviewed elements of pertinent patient history.  Dawn Anthony participated in the discussions, expressed understanding, and voiced agreement with the above plans.  All questions were answered to her satisfaction. she is encouraged to contact clinic should she have any questions or concerns prior to her return visit.     Follow up plan: - Return in about 4 weeks (around 10/30/2022) for Diabetes F/U, Bring meter and logs.    Ronny Bacon, [facility] [address] [place], [place] [address] Phone: [phone] Fax: [phone]  10/02/2022, 3:19 PM

## 2022-10-10 ENCOUNTER — Ambulatory Visit (INDEPENDENT_AMBULATORY_CARE_PROVIDER_SITE_OTHER): Payer: Medicare Other | Admitting: Pharmacist

## 2022-10-10 DIAGNOSIS — E119 Type 2 diabetes mellitus without complications: Secondary | ICD-10-CM

## 2022-10-10 MED ORDER — FREESTYLE LIBRE 3 READER DEVI: 1.0000 | Freq: Once | 0 refills | Status: AC

## 2022-10-10 MED ORDER — FREESTYLE LIBRE 3 SENSOR MISC: 5 refills | Status: DC

## 2022-10-10 NOTE — Progress Notes (Signed)
    10/10/2022 Name: Dawn Anthony MRN: [id number] DOB: [birth year]   S:  19 yoF Presents for diabetes evaluation, education, and management Patient was referred and last seen by Primary Care Provider on 09/25/22.  Patient also has recently re-established with endocrine in [place], [place] and reports a positive experience.    Insurance coverage/medication affordability: UHC medicare/medicaid   Patient reports adherence with medications. Current diabetes medications include: tresiba, humalog, metformin; empagliflozin recently d/c'd by endo due to side effects/potential med burden/patient preference  Current hypertension medications include: carvedilol, lisinopril Goal 130/80 Current hyperlipidemia medications include: rosuvastatin   Patient denies hypoglycemic events.   Patient reported dietary habits: Eats 3 meals/day Endocrine has recently re-enforced dietary/lifestyle modifications Patient-reported exercise habits: encouraged as able  O:  Lab Results  Component Value Date   HGBA1C 9.8 (H) 08/28/2022   Lipid Panel     Component Value Date/Time   CHOL 129 08/28/2022 1601   TRIG 63 08/28/2022 1601   HDL 70 08/28/2022 1601   CHOLHDL 1.8 08/28/2022 1601   LDLCALC 46 08/28/2022 1601     Home fasting blood sugars: 132, 120  2 hour post-meal/random blood sugars: <200.    Clinical Atherosclerotic Cardiovascular Disease (ASCVD): No   The ASCVD Risk score (Arnett DK, et al., 2019) failed to calculate for the following reasons:   The valid total cholesterol range is 130 to 320 mg/dL    A/P:  Diabetes E9BM currently controlled. Patient is able to verbalize appropriate hypoglycemia management plan. Patient is adherent with medication.   -Continue current diabetes regimen per endocrine;  Evaristo Bury Humalog 4-10 UNITS 3 TIMES DAILY WITH MEALS metformin;  empagliflozin recently d/c'd by endo due to side effects/potential med burden/patient preference   Would encourage retrial of  SGLT2 given cardiac benefits (if patient tolerates)  -Analysis of her CGM shows TIR 64%, TAR 36%, TBR 0% with a GMI of 7.4%  -pt wearing libre 14 day system --> will transition patient to [place] 3 CGM (hopefully it's covered under [employer] dual complete)  -Extensively discussed pathophysiology of diabetes, recommended lifestyle interventions, dietary effects on blood sugar control  -Counseled on s/sx of and management of hypoglycemia  -Next A1C anticipated 3 months.  Written patient instructions provided.  Total time in counseling 25 minutes.   Follow up PCP Clinic Visit ON 11/06/22.   Kieth Brightly, PharmD, BCACP Clinical Pharmacist, [facility]

## 2022-10-27 DIAGNOSIS — E785 Hyperlipidemia, unspecified: Secondary | ICD-10-CM | POA: Insufficient documentation

## 2022-10-27 NOTE — Progress Notes (Unsigned)
Cardiology Office Note   Date:  10/30/2022   ID:  Dawn Anthony, DOB Nov 08, 1950, MRN [id number]  PCP:  Sonny Masters, FNP  Cardiologist:   None Referring:  Sonny Masters, FNP  No chief complaint on file.     History of Present Illness: Dawn Anthony is a 72 y.o. female who presents for evaluation of CAD.  She has had three vessel CABG with LIMA to LAD, and reverse SVG to first diagonal and SVG to posterior lateral branch. Echocardiogram on 05/14/2021 which showed normal EF with no hemodynamically significant valve disease.  I was able to look back from her records.  Looks like she had a non-STEMI.  She had a well-preserved ejection fraction.  She subsequently had a LIMA to the LAD, SVG to D1 and SVG to posterolateral branch.  Since then she has had no new cardiovascular complaints.  This apparently was the first time she is known coronary disease and had some unstable symptoms that they she presented.  She lives in an apartment and does a little bit of walking but sounds like she is relatively sedentary.  Her grandson just moved in with her.  She denies any cardiovascular symptoms such as chest pressure, neck or arm discomfort.  She has had no new shortness of breath, PND or orthopnea.  She denies palpitations, presyncope or syncope.  She has had no weight gain or edema.  I do note that her blood sugars not well-controlled.    Past Medical History:  Diagnosis Date   Diabetes mellitus without complication (HCC)    Gastritis    Hyperlipidemia    Hypertension    Thyroid disease     Past Surgical History:  Procedure Laterality Date   CORONARY ARTERY BYPASS GRAFT  2022   EYE SURGERY     KNEE SURGERY Bilateral    OTHER SURGICAL HISTORY     shoulder surgery   THYROID SURGERY       Current Outpatient Medications  Medication Sig Dispense Refill   aspirin EC 81 MG tablet Take 81 mg by mouth daily. Swallow whole.     carvedilol (COREG) 12.5 MG tablet Take 12.5 mg by mouth 2  (two) times daily with a meal.     cholecalciferol (VITAMIN D3) 25 MCG (1000 UT) tablet Take 50 Units by mouth daily.     insulin degludec (TRESIBA FLEXTOUCH) 100 UNIT/ML FlexTouch Pen Inject 15 Units into the skin at bedtime.     insulin lispro (HUMALOG KWIKPEN) 100 UNIT/ML KwikPen Inject 4-10 Units into the skin 3 (three) times daily.     levothyroxine (SYNTHROID, LEVOTHROID) 75 MCG tablet Take 75 mcg by mouth daily before breakfast.     lisinopril (ZESTRIL) 5 MG tablet Take 1 tablet (5 mg total) by mouth daily. 90 tablet 3   metFORMIN (GLUCOPHAGE) 1000 MG tablet Take 1,000 mg by mouth 2 (two) times daily with a meal.     rosuvastatin (CRESTOR) 20 MG tablet Take 20 mg by mouth daily.     Continuous Glucose Sensor (FREESTYLE LIBRE 3 SENSOR) MISC Place 1 sensor on the skin every 14 days. Use to check glucose continuously. DX: E11.65 2 each 5   Insulin Pen Needle (BD PEN NEEDLE NANO U/F) 32G X 4 MM MISC UAD QID Dx E11.69 200 each 5   No current facility-administered medications for this visit.    Allergies:   Hydrochlorothiazide, Insulin glargine-lixisenatide, Simvastatin, Penicillins, Amoxicillin, Cat hair extract, and Latex    Social History:  The patient  reports that she has never smoked. She has never used smokeless tobacco. She reports that she does not drink alcohol and does not use drugs.   Family History:  The patient's family history includes Alcohol abuse in her brother; COPD in her father; Cervical cancer in her maternal grandmother; Dementia in her sister; Diabetes in her brother, father, mother, and paternal grandfather; Heart attack in her brother, father, and sister; Heart disease in her father and mother; Heart failure in her mother; Hypertension in her sister.    ROS:  Please see the history of present illness.   Otherwise, review of systems are positive for none.   All other systems are reviewed and negative.    PHYSICAL EXAM: VS:  BP 138/88   Pulse 75   Ht 5\' 1"  (1.549  m)   Wt 145 lb (65.8 kg)   BMI 27.40 kg/m  , BMI Body mass index is 27.4 kg/m. GENERAL:  Well appearing HEENT:  Pupils equal round and reactive, fundi not visualized, oral mucosa unremarkable NECK:  No jugular venous distention, waveform within normal limits, carotid upstroke brisk and symmetric, no bruits, no thyromegaly LYMPHATICS:  No cervical, inguinal adenopathy LUNGS:  Clear to auscultation bilaterally BACK:  No CVA tenderness CHEST:  Well healed sternotomy scar. HEART:  PMI not displaced or sustained,S1 and S2 within normal limits, no S3, no S4, no clicks, no rubs, no murmurs ABD:  Flat, positive bowel sounds normal in frequency in pitch, no bruits, no rebound, no guarding, no midline pulsatile mass, no hepatomegaly, no splenomegaly EXT:  2 plus pulses throughout, no edema, no cyanosis no clubbing SKIN:  No rashes no nodules NEURO:  Cranial nerves II through XII grossly intact, motor grossly intact throughout PSYCH:  Cognitively intact, oriented to person place and time    EKG:  EKG is ordered today. The ekg ordered today demonstrates sinus rhythm, rate 75, right bundle branch block, no acute ST-T wave changes.   Recent Labs: 08/28/2022: ALT 16; Hemoglobin 13.9; Platelets 273; TSH 1.530 09/25/2022: BUN 19; Creatinine, Ser 0.74; Potassium 4.6; Sodium 145    Lipid Panel    Component Value Date/Time   CHOL 129 08/28/2022 1601   TRIG 63 08/28/2022 1601   HDL 70 08/28/2022 1601   CHOLHDL 1.8 08/28/2022 1601   LDLCALC 46 08/28/2022 1601      Wt Readings from Last 3 Encounters:  10/30/22 145 lb (65.8 kg)  10/02/22 142 lb 9.6 oz (64.7 kg)  09/30/22 140 lb (63.5 kg)      Other studies Reviewed: Additional studies/ records that were reviewed today include: Previous cardiolgy records and hospitalization 2022. Review of the above records demonstrates:  Please see elsewhere in the note.     ASSESSMENT AND PLAN:  CABG: The patient's had coronary disease but has had no  recurrent symptoms.  She is participating in secondary risk reduction.  No change in therapy.  Dyslipidemia: LDL was 46 with an HDL of 70.  Continue the meds as listed.  HTN: Her blood pressure is mildly elevated.  She is going to keep an eye on this and might need med adjustment.  Of also suggested some increased physical activity which might bring her back to target.  DM: This is the biggest issue which is 9.8 A1c.  She says she is now working with an endocrinologist.  Of note we talked specifically about diet to reduce carbohydrates.  I also prescribed 150 minutes of physical activity a week and we  discussed this in detail.  Current medicines are reviewed at length with the patient today.  The patient does not have concerns regarding medicines.  The following changes have been made:  no change  Labs/ tests ordered today include:   Orders Placed This Encounter  Procedures   EKG 12-Lead    Disposition:   FU with me in 1 year or sooner if needed.   Signed, Rollene Rotunda, MD  10/30/2022 11:32 AM    [facility]

## 2022-10-30 ENCOUNTER — Encounter: Payer: Self-pay | Admitting: Cardiology

## 2022-10-30 ENCOUNTER — Ambulatory Visit: Payer: Medicare Other | Admitting: Cardiology

## 2022-10-30 VITALS — BP 138/88 | HR 75 | Ht 61.0 in | Wt 145.0 lb

## 2022-10-30 DIAGNOSIS — E785 Hyperlipidemia, unspecified: Secondary | ICD-10-CM

## 2022-10-30 DIAGNOSIS — Z7985 Long-term (current) use of injectable non-insulin antidiabetic drugs: Secondary | ICD-10-CM | POA: Diagnosis not present

## 2022-10-30 DIAGNOSIS — Z951 Presence of aortocoronary bypass graft: Secondary | ICD-10-CM | POA: Diagnosis not present

## 2022-10-30 DIAGNOSIS — I10 Essential (primary) hypertension: Secondary | ICD-10-CM

## 2022-10-30 DIAGNOSIS — E118 Type 2 diabetes mellitus with unspecified complications: Secondary | ICD-10-CM

## 2022-10-30 NOTE — Patient Instructions (Signed)
Medication Instructions:  The current medical regimen is effective;  continue present plan and medications.  *If you need a refill on your cardiac medications before your next appointment, please call your pharmacy*  Follow-Up: At [facility], you and your health needs are our priority.  As part of our continuing mission to provide you with exceptional heart care, we have created designated Provider Care Teams.  These Care Teams include your primary Cardiologist (physician) and Advanced Practice Providers (APPs -  Physician Assistants and Nurse Practitioners) who all work together to provide you with the care you need, when you need it.  We recommend signing up for the patient portal called "MyChart".  Sign up information is provided on this After Visit Summary.  MyChart is used to connect with patients for Virtual Visits (Telemedicine).  Patients are able to view lab/test results, encounter notes, upcoming appointments, etc.  Non-urgent messages can be sent to your provider as well.   To learn more about what you can do with MyChart, go to [email or web address].    Your next appointment:   1 year(s)  Provider:   James Hochrein, MD     

## 2022-11-04 ENCOUNTER — Ambulatory Visit (INDEPENDENT_AMBULATORY_CARE_PROVIDER_SITE_OTHER): Payer: Medicare Other | Admitting: *Deleted

## 2022-11-04 DIAGNOSIS — E119 Type 2 diabetes mellitus without complications: Secondary | ICD-10-CM

## 2022-11-04 DIAGNOSIS — I152 Hypertension secondary to endocrine disorders: Secondary | ICD-10-CM

## 2022-11-04 NOTE — Chronic Care Management (AMB) (Signed)
Chronic Care Management   CCM RN Visit Note  11/04/2022 Name: Dawn Anthony MRN: [id number] DOB: [birth year]  Subjective: Dawn Anthony is a 72 y.o. year old female who is a primary care patient of Rakes, Doralee Albino, FNP. The patient was referred to the Chronic Care Management team for assistance with care management needs subsequent to provider initiation of CCM services and plan of care.    Today's Visit:  Engaged with patient by telephone for follow up visit.        Goals Addressed             This Visit's Progress    CCM (DIABETES) EXPECTED OUTCOME: MONITOR, SELF- MANAGE AND REDUCE SYMPTOMS OF DIABETES       Current Barriers:  Knowledge Deficits related to Diabetes management Chronic Disease Management support and education needs related to Diabetes, diet and nutrition No Advanced Directives in place- pt requests information, documents mailed Patient will be working with pharmacist Patient reports CBG is monitored by CGM Freestyle Libre 3 with fasting ranges 98-112, random ranges 130's pt states she did not take her insulin for a month because she fractured her right arm in a fall on 06/25/22, her grandson did not want to do the injection, pt feels this is why her AIC went up to 9.8, pt states she is working on her diet, walking daily and is working towards goal of lowering AIC to below 7. Patient reports she saw a dietician in the past and wants to wait and see if she can get AIC down on her own, then if she cannot will revisit the idea of seeing a nutritionist  Planned Interventions: Reviewed medications with patient and discussed importance of medication adherence;        Counseled on importance of regular laboratory monitoring as prescribed;        Discussed plans with patient for ongoing care management follow up and provided patient with direct contact information for care management team;      Reviewed scheduled/upcoming provider appointments including: endocrinologist;          Advised patient, providing education and rationale, to check cbg per CGM  and record        Review of patient status, including review of consultants reports, relevant laboratory and other test results, and medications completed;       Screening for signs and symptoms of depression related to chronic disease state;        Reviewed fasting blood sugar goals of 80-130 and less than 180 1 1/2-2 hours after meals Reinforced safety precautions Reinforced carbohydrate modified diet and plate method  Symptom Management: Take medications as prescribed   Attend all scheduled provider appointments Call pharmacy for medication refills 3-7 days in advance of running out of medications Attend church or other social activities Perform all self care activities independently  Call provider office for new concerns or questions  check blood sugar at prescribed times: per [facility]  check feet daily for cuts, sores or redness enter blood sugar readings and medication or insulin into daily log take the blood sugar log to all doctor visits take the blood sugar meter to all doctor visits trim toenails straight across eat fish at least once per week fill half of plate with vegetables limit fast food meals to no more than 1 per week manage portion size prepare main meal at home 3 to 5 days each week read food labels for fat, fiber, carbohydrates and portion size keep feet up  while sitting wash and dry feet carefully every day wear comfortable, cotton socks wear comfortable, well-fitting shoes Goal for blood sugars- fasting blood sugar goals of 80-130 and less than 180 1 1/2-2 hours after meals fall prevention strategies: change position slowly, use assistive device such as walker or cane (per provider recommendations) when walking, keep walkways clear, have good lighting in room. It is important to contact your provider if you have any falls, maintain muscle strength/tone by exercise per provider  recommendations.  Follow Up Plan: Telephone follow up appointment with care management team member scheduled for:  01/31/23 at 215 pm       CCM (HYPERTENSION) EXPECTED OUTCOME: MONITOR, SELF-MANAGE AND REDUCE SYMPTOMS OF HYPERTENSION       Current Barriers:  Knowledge Deficits related to Hypertension management Care Coordination needs related to pharmacy/ medication management in a patient with Hypertension Chronic Disease Management support and education needs related to Hypertension, diet No Advanced Directives in place- information previously mailed Patient reports her 38 year old grandson lives with her, provides transportation as pt is currently not driving, pt states she is independent with all aspects of her care, has a shower seat to make bathing easier. Patient reports she has blood pressure cuff and does not monitor blood pressure but is willing to start, states she has to replace batteries Patient reports she has started walking daily for 30 minutes  Planned Interventions: Evaluation of current treatment plan related to hypertension self management and patient's adherence to plan as established by provider;   Reviewed medications with patient and discussed importance of compliance;  Counseled on the importance of exercise goals with target of 150 minutes per week Advised patient, providing education and rationale, to monitor blood pressure daily and record, calling PCP for findings outside established parameters;  Discussed complications of poorly controlled blood pressure such as heart disease, stroke, circulatory complications, vision complications, kidney impairment, sexual dysfunction;  Reinforced low sodium diet  Symptom Management: Take medications as prescribed   Attend all scheduled provider appointments Call pharmacy for medication refills 3-7 days in advance of running out of medications Attend church or other social activities Perform all self care activities  independently  Call provider office for new concerns or questions  check blood pressure weekly choose a place to take my blood pressure (home, clinic or office, retail store) write blood pressure results in a log or diary learn about high blood pressure keep a blood pressure log take blood pressure log to all doctor appointments call doctor for signs and symptoms of high blood pressure develop an action plan for high blood pressure take medications for blood pressure exactly as prescribed report new symptoms to your doctor eat more whole grains, fruits and vegetables, lean meats and healthy fats Follow low sodium diet Read food labels for sodium content Advanced directives packet mailed- please look over and complete Continue walking daily- keep up the good work!  Follow Up Plan: Telephone follow up appointment with care management team member scheduled for:  01/31/23 at 215 pm           Plan:Telephone follow up appointment with care management team member scheduled for:  01/31/23 at 215 pm  Irving Shows [facility], BSN RN Case Manager [facility] [phone]

## 2022-11-04 NOTE — Patient Instructions (Signed)
Please call the care guide team at [phone] if you need to cancel or reschedule your appointment.   If you are experiencing a Mental Health or Behavioral Health Crisis or need someone to talk to, please call the Suicide and Crisis Lifeline: 988 call the [place] National Suicide Prevention Lifeline: (870)569-4186 or TTY: (731) 556-3779 TTY 3017392021) to talk to a trained counselor call 1-800-273-TALK (toll free, 24 hour hotline) go to [facility] [address] [place] [phone]) call the [facility]: [phone] call 911   Following is a copy of the CCM Program Consent:  CCM service includes personalized support from designated clinical staff supervised by the physician, including individualized plan of care and coordination with other care providers 24/7 contact phone numbers for assistance for urgent and routine care needs. Service will only be billed when office clinical staff spend 20 minutes or more in a month to coordinate care. Only one practitioner may furnish and bill the service in a calendar month. The patient may stop CCM services at amy time (effective at the end of the month) by phone call to the office staff. The patient will be responsible for cost sharing (co-pay) or up to 20% of the service fee (after annual deductible is met)  Following is a copy of your full provider care plan:   Goals Addressed             This Visit's Progress    CCM (DIABETES) EXPECTED OUTCOME: MONITOR, SELF- MANAGE AND REDUCE SYMPTOMS OF DIABETES       Current Barriers:  Knowledge Deficits related to Diabetes management Chronic Disease Management support and education needs related to Diabetes, diet and nutrition No Advanced Directives in place- pt requests information, documents mailed Patient will be working with pharmacist Patient reports CBG is monitored by CGM Freestyle Libre 3 with fasting ranges 98-112, random ranges 130's  pt states she did not take her insulin for a month because she fractured her right arm in a fall on 06/25/22, her grandson did not want to do the injection, pt feels this is why her AIC went up to 9.8, pt states she is working on her diet, walking daily and is working towards goal of lowering AIC to below 7. Patient reports she saw a dietician in the past and wants to wait and see if she can get AIC down on her own, then if she cannot will revisit the idea of seeing a nutritionist  Planned Interventions: Reviewed medications with patient and discussed importance of medication adherence;        Counseled on importance of regular laboratory monitoring as prescribed;        Discussed plans with patient for ongoing care management follow up and provided patient with direct contact information for care management team;      Reviewed scheduled/upcoming provider appointments including: endocrinologist;         Advised patient, providing education and rationale, to check cbg per CGM  and record        Review of patient status, including review of consultants reports, relevant laboratory and other test results, and medications completed;       Screening for signs and symptoms of depression related to chronic disease state;        Reviewed fasting blood sugar goals of 80-130 and less than 180 1 1/2-2 hours after meals Reinforced safety precautions Reinforced carbohydrate modified diet and plate method  Symptom Management: Take medications as prescribed   Attend  all scheduled provider appointments Call pharmacy for medication refills 3-7 days in advance of running out of medications Attend church or other social activities Perform all self care activities independently  Call provider office for new concerns or questions  check blood sugar at prescribed times: per [facility]  check feet daily for cuts, sores or redness enter blood sugar readings and medication or insulin into daily log take the blood  sugar log to all doctor visits take the blood sugar meter to all doctor visits trim toenails straight across eat fish at least once per week fill half of plate with vegetables limit fast food meals to no more than 1 per week manage portion size prepare main meal at home 3 to 5 days each week read food labels for fat, fiber, carbohydrates and portion size keep feet up while sitting wash and dry feet carefully every day wear comfortable, cotton socks wear comfortable, well-fitting shoes Goal for blood sugars- fasting blood sugar goals of 80-130 and less than 180 1 1/2-2 hours after meals fall prevention strategies: change position slowly, use assistive device such as walker or cane (per provider recommendations) when walking, keep walkways clear, have good lighting in room. It is important to contact your provider if you have any falls, maintain muscle strength/tone by exercise per provider recommendations.  Follow Up Plan: Telephone follow up appointment with care management team member scheduled for:  01/31/23 at 215 pm       CCM (HYPERTENSION) EXPECTED OUTCOME: MONITOR, SELF-MANAGE AND REDUCE SYMPTOMS OF HYPERTENSION       Current Barriers:  Knowledge Deficits related to Hypertension management Care Coordination needs related to pharmacy/ medication management in a patient with Hypertension Chronic Disease Management support and education needs related to Hypertension, diet No Advanced Directives in place- information previously mailed Patient reports her 67 year old grandson lives with her, provides transportation as pt is currently not driving, pt states she is independent with all aspects of her care, has a shower seat to make bathing easier. Patient reports she has blood pressure cuff and does not monitor blood pressure but is willing to start, states she has to replace batteries Patient reports she has started walking daily for 30 minutes  Planned Interventions: Evaluation of  current treatment plan related to hypertension self management and patient's adherence to plan as established by provider;   Reviewed medications with patient and discussed importance of compliance;  Counseled on the importance of exercise goals with target of 150 minutes per week Advised patient, providing education and rationale, to monitor blood pressure daily and record, calling PCP for findings outside established parameters;  Discussed complications of poorly controlled blood pressure such as heart disease, stroke, circulatory complications, vision complications, kidney impairment, sexual dysfunction;  Reinforced low sodium diet  Symptom Management: Take medications as prescribed   Attend all scheduled provider appointments Call pharmacy for medication refills 3-7 days in advance of running out of medications Attend church or other social activities Perform all self care activities independently  Call provider office for new concerns or questions  check blood pressure weekly choose a place to take my blood pressure (home, clinic or office, retail store) write blood pressure results in a log or diary learn about high blood pressure keep a blood pressure log take blood pressure log to all doctor appointments call doctor for signs and symptoms of high blood pressure develop an action plan for high blood pressure take medications for blood pressure exactly as prescribed report new symptoms  to your doctor eat more whole grains, fruits and vegetables, lean meats and healthy fats Follow low sodium diet Read food labels for sodium content Advanced directives packet mailed- please look over and complete Continue walking daily- keep up the good work!  Follow Up Plan: Telephone follow up appointment with care management team member scheduled for:  01/31/23 at 215 pm           Patient verbalizes understanding of instructions and care plan provided today and agrees to view in MyChart.  Active MyChart status and patient understanding of how to access instructions and care plan via MyChart confirmed with patient.  Telephone follow up appointment with care management team member scheduled for: 01/31/23 at 215 pm  Diabetes Mellitus and Nutrition, Adult When you have diabetes, or diabetes mellitus, it is very important to have healthy eating habits because your blood sugar (glucose) levels are greatly affected by what you eat and drink. Eating healthy foods in the right amounts, at about the same times every day, can help you: Manage your blood glucose. Lower your risk of heart disease. Improve your blood pressure. Reach or maintain a healthy weight. What can affect my meal plan? Every person with diabetes is different, and each person has different needs for a meal plan. Your health care provider may recommend that you work with a dietitian to make a meal plan that is best for you. Your meal plan may vary depending on factors such as: The calories you need. The medicines you take. Your weight. Your blood glucose, blood pressure, and cholesterol levels. Your activity level. Other health conditions you have, such as heart or kidney disease. How do carbohydrates affect me? Carbohydrates, also called carbs, affect your blood glucose level more than any other type of food. Eating carbs raises the amount of glucose in your blood. It is important to know how many carbs you can safely have in each meal. This is different for every person. Your dietitian can help you calculate how many carbs you should have at each meal and for each snack. How does alcohol affect me? Alcohol can cause a decrease in blood glucose (hypoglycemia), especially if you use insulin or take certain diabetes medicines by mouth. Hypoglycemia can be a life-threatening condition. Symptoms of hypoglycemia, such as sleepiness, dizziness, and confusion, are similar to symptoms of having too much alcohol. Do not drink  alcohol if: Your health care provider tells you not to drink. You are pregnant, may be pregnant, or are planning to become pregnant. If you drink alcohol: Limit how much you have to: 0-1 drink a day for women. 0-2 drinks a day for men. Know how much alcohol is in your drink. In the [place] one drink equals one 12 oz bottle of beer (355 mL), one 5 oz glass of wine (148 mL), or one 1 oz glass of hard liquor (44 mL). Keep yourself hydrated with water, diet soda, or unsweetened iced tea. Keep in mind that regular soda, juice, and other mixers may contain a lot of sugar and must be counted as carbs. What are tips for following this plan?  Reading food labels Start by checking the serving size on the Nutrition Facts label of packaged foods and drinks. The number of calories and the amount of carbs, fats, and other nutrients listed on the label are based on one serving of the item. Many items contain more than one serving per package. Check the total grams (g) of carbs in one serving. Check the  number of grams of saturated fats and trans fats in one serving. Choose foods that have a low amount or none of these fats. Check the number of milligrams (mg) of salt (sodium) in one serving. Most people should limit total sodium intake to less than 2,300 mg per day. Always check the nutrition information of foods labeled as "low-fat" or "nonfat." These foods may be higher in added sugar or refined carbs and should be avoided. Talk to your dietitian to identify your daily goals for nutrients listed on the label. Shopping Avoid buying canned, pre-made, or processed foods. These foods tend to be high in fat, sodium, and added sugar. Shop around the outside edge of the grocery store. This is where you will most often find fresh fruits and vegetables, bulk grains, fresh meats, and fresh dairy products. Cooking Use low-heat cooking methods, such as baking, instead of high-heat cooking methods, such as deep  frying. Cook using healthy oils, such as olive, canola, or sunflower oil. Avoid cooking with butter, cream, or high-fat meats. Meal planning Eat meals and snacks regularly, preferably at the same times every day. Avoid going long periods of time without eating. Eat foods that are high in fiber, such as fresh fruits, vegetables, beans, and whole grains. Eat 4-6 oz (112-168 g) of lean protein each day, such as lean meat, chicken, fish, eggs, or tofu. One ounce (oz) (28 g) of lean protein is equal to: 1 oz (28 g) of meat, chicken, or fish. 1 egg.  cup (62 g) of tofu. Eat some foods each day that contain healthy fats, such as avocado, nuts, seeds, and fish. What foods should I eat? Fruits Berries. Apples. Oranges. Peaches. Apricots. Plums. Grapes. Mangoes. Papayas. Pomegranates. Kiwi. Cherries. Vegetables Leafy greens, including lettuce, spinach, kale, chard, collard greens, mustard greens, and cabbage. Beets. Cauliflower. Broccoli. Carrots. Green beans. Tomatoes. Peppers. Onions. Cucumbers. Brussels sprouts. Grains Whole grains, such as whole-wheat or whole-grain bread, crackers, tortillas, cereal, and pasta. Unsweetened oatmeal. Quinoa. Brown or wild rice. Meats and other proteins Seafood. Poultry without skin. Lean cuts of poultry and beef. Tofu. Nuts. Seeds. Dairy Low-fat or fat-free dairy products such as milk, yogurt, and cheese. The items listed above may not be a complete list of foods and beverages you can eat and drink. Contact a dietitian for more information. What foods should I avoid? Fruits Fruits canned with syrup. Vegetables Canned vegetables. Frozen vegetables with butter or cream sauce. Grains Refined white flour and flour products such as bread, pasta, snack foods, and cereals. Avoid all processed foods. Meats and other proteins Fatty cuts of meat. Poultry with skin. Breaded or fried meats. Processed meat. Avoid saturated fats. Dairy Full-fat yogurt, cheese, or  milk. Beverages Sweetened drinks, such as soda or iced tea. The items listed above may not be a complete list of foods and beverages you should avoid. Contact a dietitian for more information. Questions to ask a health care provider Do I need to meet with a certified diabetes care and education specialist? Do I need to meet with a dietitian? What number can I call if I have questions? When are the best times to check my blood glucose? Where to find more information: American Diabetes Association: diabetes.org Academy of Nutrition and Dietetics: eatright[employer] of Diabetes and Digestive and Kidney Diseases: [email or web address].si Association of Diabetes Care & Education Specialists: diabeteseducator.org Summary It is important to have healthy eating habits because your blood sugar (glucose) levels are greatly affected by what you eat and drink.  It is important to use alcohol carefully. A healthy meal plan will help you manage your blood glucose and lower your risk of heart disease. Your health care provider may recommend that you work with a dietitian to make a meal plan that is best for you. This information is not intended to replace advice given to you by your health care provider. Make sure you discuss any questions you have with your health care provider. Document Revised: 01/12/2020 Document Reviewed: 01/12/2020 Elsevier Patient Education  2023 [employer].

## 2022-11-06 ENCOUNTER — Ambulatory Visit: Payer: Medicare Other | Admitting: Nurse Practitioner

## 2022-11-06 ENCOUNTER — Encounter: Payer: Self-pay | Admitting: Nurse Practitioner

## 2022-11-06 VITALS — BP 134/80 | HR 80 | Ht 61.0 in | Wt 146.2 lb

## 2022-11-06 DIAGNOSIS — E1165 Type 2 diabetes mellitus with hyperglycemia: Secondary | ICD-10-CM

## 2022-11-06 DIAGNOSIS — E890 Postprocedural hypothyroidism: Secondary | ICD-10-CM | POA: Diagnosis not present

## 2022-11-06 DIAGNOSIS — Z794 Long term (current) use of insulin: Secondary | ICD-10-CM

## 2022-11-06 MED ORDER — INSULIN LISPRO (1 UNIT DIAL) 100 UNIT/ML (KWIKPEN): 4.0000 [IU] | PEN_INJECTOR | Freq: Three times a day (TID) | SUBCUTANEOUS | 3 refills | Status: DC

## 2022-11-06 MED ORDER — BD PEN NEEDLE NANO U/F 32G X 4 MM MISC: 5 refills | Status: DC

## 2022-11-06 MED ORDER — LEVOTHYROXINE SODIUM 75 MCG PO TABS: 75.0000 ug | ORAL_TABLET | Freq: Every day | ORAL | 1 refills | Status: DC

## 2022-11-06 MED ORDER — TRESIBA FLEXTOUCH 100 UNIT/ML ~~LOC~~ SOPN: 15.0000 [IU] | PEN_INJECTOR | Freq: Every day | SUBCUTANEOUS | 3 refills | Status: DC

## 2022-11-06 MED ORDER — METFORMIN HCL 1000 MG PO TABS: 1000.0000 mg | ORAL_TABLET | Freq: Two times a day (BID) | ORAL | 3 refills | Status: DC

## 2022-11-06 MED ORDER — FREESTYLE LIBRE 3 SENSOR MISC: 3 refills | Status: DC

## 2022-11-06 NOTE — Patient Instructions (Signed)

## 2022-11-06 NOTE — Progress Notes (Signed)
Endocrinology Follow Up Note       11/06/2022, 1:42 PM   Subjective:    Patient ID: Dawn Anthony, female    DOB: July 25, 1950.  Dawn Anthony is being seen in follow up after being seen in consultation for management of currently uncontrolled symptomatic diabetes requested by  Sonny Masters, FNP.   Past Medical History:  Diagnosis Date   Diabetes mellitus without complication (HCC)    Gastritis    Hyperlipidemia    Hypertension    Thyroid disease     Past Surgical History:  Procedure Laterality Date   CORONARY ARTERY BYPASS GRAFT  2022   EYE SURGERY     KNEE SURGERY Bilateral    OTHER SURGICAL HISTORY     shoulder surgery   THYROID SURGERY      Social History   Socioeconomic History   Marital status: Widowed    Spouse name: Not on file   Number of children: 1   Years of education: Not on file   Highest education level: 12th grade  Occupational History   Not on file  Tobacco Use   Smoking status: Never   Smokeless tobacco: Never  Vaping Use   Vaping Use: Never used  Substance and Sexual Activity   Alcohol use: Never   Drug use: Never   Sexual activity: Not Currently  Other Topics Concern   Not on file  Social History Narrative   Grandson lives with her.    Social Determinants of Health   Financial Resource Strain: Low Risk  (09/30/2022)   Overall Financial Resource Strain (CARDIA)    Difficulty of Paying Living Expenses: Not hard at all  Food Insecurity: No Food Insecurity (09/30/2022)   Hunger Vital Sign    Worried About Running Out of Food in the Last Year: Never true    Ran Out of Food in the Last Year: Never true  Recent Concern: Food Insecurity - Food Insecurity Present (09/23/2022)   Hunger Vital Sign    Worried About Running Out of Food in the Last Year: Sometimes true    Ran Out of Food in the Last Year: Sometimes true  Transportation Needs: No Transportation Needs  (09/30/2022)   PRAPARE - Administrator, Civil Service (Medical): No    Lack of Transportation (Non-Medical): No  Physical Activity: Insufficiently Active (09/30/2022)   Exercise Vital Sign    Days of Exercise per Week: 3 days    Minutes of Exercise per Session: 30 min  Stress: No Stress Concern Present (09/30/2022)   [employer] of Occupational Health - Occupational Stress Questionnaire    Feeling of Stress : Not at all  Social Connections: Socially Isolated (09/30/2022)   Social Connection and Isolation Panel [NHANES]    Frequency of Communication with Friends and Family: More than three times a week    Frequency of Social Gatherings with Friends and Family: More than three times a week    Attends Religious Services: Never    Database administrator or Organizations: No    Attends Banker Meetings: Never    Marital Status: Widowed    Family History  Problem Relation Age of Onset  Diabetes Mother    Heart disease Mother    Heart failure Mother    Heart attack Father    Heart disease Father    COPD Father    Diabetes Father    Dementia Sister    Heart attack Sister    Hypertension Sister    Diabetes Brother    Heart attack Brother    Alcohol abuse Brother    Cervical cancer Maternal Grandmother    Diabetes Paternal Grandfather     Outpatient Encounter Medications as of 11/06/2022  Medication Sig   aspirin EC 81 MG tablet Take 81 mg by mouth daily. Swallow whole.   carvedilol (COREG) 12.5 MG tablet Take 12.5 mg by mouth 2 (two) times daily with a meal.   cholecalciferol (VITAMIN D3) 25 MCG (1000 UT) tablet Take 50 Units by mouth daily.   lisinopril (ZESTRIL) 5 MG tablet Take 1 tablet (5 mg total) by mouth daily.   rosuvastatin (CRESTOR) 20 MG tablet Take 20 mg by mouth daily.   [DISCONTINUED] Continuous Glucose Sensor (FREESTYLE LIBRE 3 SENSOR) MISC Place 1 sensor on the skin every 14 days. Use to check glucose continuously. DX: E11.65    [DISCONTINUED] insulin degludec (TRESIBA FLEXTOUCH) 100 UNIT/ML FlexTouch Pen Inject 15 Units into the skin at bedtime.   [DISCONTINUED] insulin lispro (HUMALOG KWIKPEN) 100 UNIT/ML KwikPen Inject 4-10 Units into the skin 3 (three) times daily.   [DISCONTINUED] Insulin Pen Needle (BD PEN NEEDLE NANO U/F) 32G X 4 MM MISC UAD QID Dx E11.69   [DISCONTINUED] levothyroxine (SYNTHROID, LEVOTHROID) 75 MCG tablet Take 75 mcg by mouth daily before breakfast.   [DISCONTINUED] metFORMIN (GLUCOPHAGE) 1000 MG tablet Take 1,000 mg by mouth 2 (two) times daily with a meal.   Continuous Glucose Sensor (FREESTYLE LIBRE 3 SENSOR) MISC Place 1 sensor on the skin every 14 days. Use to check glucose continuously. DX: E11.65   insulin degludec (TRESIBA FLEXTOUCH) 100 UNIT/ML FlexTouch Pen Inject 15 Units into the skin at bedtime.   insulin lispro (HUMALOG KWIKPEN) 100 UNIT/ML KwikPen Inject 4-10 Units into the skin 3 (three) times daily.   Insulin Pen Needle (BD PEN NEEDLE NANO U/F) 32G X 4 MM MISC UAD QID Dx E11.69   levothyroxine (SYNTHROID) 75 MCG tablet Take 1 tablet (75 mcg total) by mouth daily before breakfast.   metFORMIN (GLUCOPHAGE) 1000 MG tablet Take 1 tablet (1,000 mg total) by mouth 2 (two) times daily with a meal.   No facility-administered encounter medications on file as of 11/06/2022.    ALLERGIES: Allergies  Allergen Reactions   Hydrochlorothiazide     Other Reaction(s): Unknown-Unspecified  Stopped due to pancreatitis   Insulin Glargine-Lixisenatide Nausea And Vomiting   Simvastatin     Other Reaction(s): Unknown-Unspecified  Stopped due to pancreatitis  Stopped due to pancreatitis   Penicillins Hives   Amoxicillin Itching and Rash   Cat Hair Extract Itching and Rash   Latex Other (See Comments) and Rash    VACCINATION STATUS: Immunization History  Administered Date(s) Administered   Influenza Split 04/24/2013, 03/07/2017, 03/27/2018   Influenza, High Dose Seasonal PF 03/08/2021,  03/08/2021   Influenza, Quadrivalent, Recombinant, Inj, Pf 03/08/2021   Influenza, Seasonal, Injecte, Preservative Fre 04/27/2015, 02/28/2017   Influenza,inj,Quad PF,6+ Mos 04/02/2020   Influenza,trivalent, recombinat, inj, PF 04/24/2013, 03/07/2017, 03/27/2018   Influenza-Unspecified 04/27/2015, 02/28/2017, 03/08/2021   Moderna Sars-Covid-2 Vaccination 11/01/2019, 12/09/2019   PFIZER(Purple Top)SARS-COV-2 Vaccination 06/13/2020   Pneumococcal Conjugate-13 04/07/2018   Pneumococcal Polysaccharide-23 11/21/2007, 10/25/2019   Tdap  12/10/2006, 04/24/2017   Zoster, Live 03/22/2013    Diabetes She presents for her follow-up diabetic visit. Onset time: diagnosed at approx age of 75. Her disease course has been improving. Hypoglycemia symptoms include nervousness/anxiousness, sweats and tremors. There are no diabetic associated symptoms. There are no hypoglycemic complications. 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 Risk factors for coronary artery disease include diabetes mellitus, dyslipidemia, family history, hypertension, sedentary lifestyle and post-menopausal. Current diabetic treatment includes intensive insulin program and oral agent (monotherapy). She is compliant with treatment most of the time. Her weight is fluctuating minimally. She is following a generally healthy diet. When asked about meal planning, she reported none. She has not had a previous visit with a dietitian. She participates in exercise intermittently. Her home blood glucose trend is decreasing steadily. Her overall blood glucose range is >200 mg/dl. (She presents today, accompanied by her grandson, with her CGM showing improving glycemic profile.  She was not due for another A1c today.  Analysis of her CGM shows TIR 40%, TAR 60%, TBR 0% with a GMI of 8.3%.  She is continuing to learn which foods cause high spikes in glucose and has incorporated more physical exercise into her daily routine.) An ACE  inhibitor/angiotensin II receptor blocker is being taken. She does not see a podiatrist.Eye exam is current.     Review of systems  Constitutional: + Minimally fluctuating body weight, current Body mass index is 27.62 kg/m., no fatigue, no subjective hyperthermia, no subjective hypothermia Eyes: no blurry vision, no xerophthalmia ENT: no sore throat, no nodules palpated in throat, no dysphagia/odynophagia, no hoarseness Cardiovascular: no chest pain, no shortness of breath, no palpitations, no leg swelling Respiratory: no cough, no shortness of breath Gastrointestinal: no nausea/vomiting/diarrhea Musculoskeletal: no muscle/joint aches Skin: no rashes, no hyperemia Neurological: no tremors, no numbness, no tingling, no dizziness Psychiatric: no depression, no anxiety  Objective:     BP 134/80 (BP Location: Left Arm, Patient Position: Sitting, Cuff Size: Large)   Pulse 80   Ht 5\' 1"  (1.549 m)   Wt 146 lb 3.2 oz (66.3 kg)   BMI 27.62 kg/m   Wt Readings from Last 3 Encounters:  11/06/22 146 lb 3.2 oz (66.3 kg)  10/30/22 145 lb (65.8 kg)  10/02/22 142 lb 9.6 oz (64.7 kg)     BP Readings from Last 3 Encounters:  11/06/22 134/80  10/30/22 138/88  10/02/22 120/82     Physical Exam- Limited  Constitutional:  Body mass index is 27.62 kg/m. , not in acute distress, normal state of mind Eyes:  EOMI, no exophthalmos Musculoskeletal: no gross deformities, strength intact in all four extremities, no gross restriction of joint movements Skin:  no rashes, no hyperemia Neurological: no tremor with outstretched hands   Diabetic Foot Exam - Simple   No data filed     CMP ( most recent) CMP     Component Value Date/Time   NA 145 (H) 09/25/2022 0854   K 4.6 09/25/2022 0854   CL 105 09/25/2022 0854   CO2 22 09/25/2022 0854   GLUCOSE 154 (H) 09/25/2022 0854   GLUCOSE 265 (H) 07/09/2019 1456   BUN 19 09/25/2022 0854   CREATININE 0.74 09/25/2022 0854   CALCIUM 9.6 09/25/2022  0854   PROT 6.6 08/28/2022 1601   ALBUMIN 4.5 08/28/2022 1601   AST 22 08/28/2022 1601   ALT 16 08/28/2022 1601   ALKPHOS 79 08/28/2022 1601   BILITOT 0.3 08/28/2022 1601   GFRNONAA >60  07/09/2019 1456   GFRAA >60 07/09/2019 1456     Diabetic Labs (most recent): Lab Results  Component Value Date   HGBA1C 9.8 (H) 08/28/2022     Lipid Panel ( most recent) Lipid Panel     Component Value Date/Time   CHOL 129 08/28/2022 1601   TRIG 63 08/28/2022 1601   HDL 70 08/28/2022 1601   CHOLHDL 1.8 08/28/2022 1601   LDLCALC 46 08/28/2022 1601   LABVLDL 13 08/28/2022 1601      Lab Results  Component Value Date   TSH 1.530 08/28/2022           Assessment & Plan:   1) Type 2 diabetes mellitus with hyperglycemia, with long-term current use of insulin  She presents today, accompanied by her grandson, with her CGM showing improving glycemic profile.  She was not due for another A1c today.  Analysis of her CGM shows TIR 40%, TAR 60%, TBR 0% with a GMI of 8.3%.  She is continuing to learn which foods cause high spikes in glucose and has incorporated more physical exercise into her daily routine.  - Dawn Anthony has currently uncontrolled symptomatic type 2 DM since 72 years of age, with most recent A1c of 9.8 %.   -Recent labs reviewed.  - I had a long discussion with her about the progressive nature of diabetes and the pathology behind its complications. -her diabetes is complicated by CAD with CABG and she remains at a high risk for more acute and chronic complications which include CAD, CVA, CKD, retinopathy, and neuropathy. These are all discussed in detail with her.  The following Lifestyle Medicine recommendations according to American College of Lifestyle Medicine [facility]) were discussed and offered to patient and she agrees to start the journey:  A. Whole Foods, Plant-based plate comprising of fruits and vegetables, plant-based proteins, whole-grain carbohydrates was  discussed in detail with the patient.   A list for source of those nutrients were also provided to the patient.  Patient will use only water or unsweetened tea for hydration. B.  The need to stay away from risky substances including alcohol, smoking; obtaining 7 to 9 hours of restorative sleep, at least 150 minutes of moderate intensity exercise weekly, the importance of healthy social connections,  and stress reduction techniques were discussed. C.  A full color page of  Calorie density of various food groups per pound showing examples of each food groups was provided to the patient.  - I have counseled her on diet and weight management by adopting a carbohydrate restricted/protein rich diet. Patient is encouraged to switch to unprocessed or minimally processed complex starch and increased protein intake (animal or plant source), fruits, and vegetables. -  she is advised to stick to a routine mealtimes to eat 3 meals a day and avoid unnecessary snacks (to snack only to correct hypoglycemia).   - she acknowledges that there is a room for improvement in her food and drink choices. - Suggestion is made for her to avoid simple carbohydrates from her diet including Cakes, Sweet Desserts, Ice Cream, Soda (diet and regular), Sweet Tea, Candies, Chips, Cookies, Store Bought Juices, Alcohol in Excess of 1-2 drinks a day, Artificial Sweeteners, Coffee Creamer, and "Sugar-free" Products. This will help patient to have more stable blood glucose profile and potentially avoid unintended weight gain.  - I have approached her with the following individualized plan to manage her diabetes and patient agrees:   -Based on her improving glycemic profile, no changes will  be made to her medications today.  She is advised to continue her Tresiba 15 units SQ nightly, Humalog 4-10 units TID with meals if glucose is above 90 and she is eating (Specific instructions on how to titrate insulin dosage based on glucose readings given  to patient in writing), and Metformin 1000 mg po twice daily with meals.   -she is encouraged to start monitoring glucose 4 times daily (using her CGM), before meals and before bed, and to call the clinic if she has readings less than 70 or above 300 for 3 tests in a row.  - she is warned not to take insulin without proper monitoring per orders. - Adjustment parameters are given to her for hypo and hyperglycemia in writing.  - her [place] Pepper was previously discontinued to de-escalate her treatment regimen.  - she is not ideal candidate for incretin therapy due to body habitus.   - Specific targets for  A1c; LDL, HDL, and Triglycerides were discussed with the patient.  2) Blood Pressure /Hypertension:  her blood pressure is controlled to target.   she is advised to continue her current medications including Coreg 12.5 mg po twice daily and Lisinopril 5 mg p.o. daily with breakfast.  3) Lipids/Hyperlipidemia:    Review of her recent lipid panel from 08/28/22 showed controlled LDL at 46 .  she is advised to continue Crestor 20 mg daily at bedtime.  Side effects and precautions discussed with her.  4)  Weight/Diet:  her Body mass index is 27.62 kg/m.  -  she is NOT a candidate for weight loss.   Exercise, and detailed carbohydrates information provided  -  detailed on discharge instructions.  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  She is currently on Levothyroxine 75 mcg po daily before breakfast.  Her recent TFTs are consistent with appropriate hormone replacement, she is advised to continue.  Will recheck thyroid function tests prior to next visit and adjust dose accordingly.   - The correct intake of thyroid hormone (Levothyroxine, Synthroid), is on empty stomach first thing in the morning, with water, separated by at least 30 minutes from breakfast and other medications,  and separated by more than 4 hours from calcium, iron, multivitamins, acid  reflux medications (PPIs).  - This medication is a life-long medication and will be needed to correct thyroid hormone imbalances for the rest of your life.  The dose may change from time to time, based on thyroid blood work.  - It is extremely important to be consistent taking this medication, near the same time each morning.  -AVOID TAKING PRODUCTS CONTAINING BIOTIN (commonly found in Hair, Skin, Nails vitamins) AS IT INTERFERES WITH THE VALIDITY OF THYROID FUNCTION BLOOD TESTS.  6) Chronic Care/Health Maintenance: -she is on ACEI/ARB and Statin medications and is encouraged to initiate and continue to follow up with Ophthalmology, Dentist, Podiatrist at least yearly or according to recommendations, and advised to stay away from smoking. I have recommended yearly flu vaccine and pneumonia vaccine at least every 5 years; moderate intensity exercise for up to 150 minutes weekly; and sleep for at least 7 hours a day.  - she is advised to maintain close follow up with Rakes, Doralee Albino, FNP for primary care needs, as well as her other providers for optimal and coordinated care.     I spent  41  minutes in the care of the patient today including review of labs from CMP, Lipids, Thyroid Function, Hematology (  current and previous including abstractions from other facilities); face-to-face time discussing  her blood glucose readings/logs, discussing hypoglycemia and hyperglycemia episodes and symptoms, medications doses, her options of short and long term treatment based on the latest standards of care / guidelines;  discussion about incorporating lifestyle medicine;  and documenting the encounter. Risk reduction counseling performed per USPSTF guidelines to reduce obesity and cardiovascular risk factors.     Please refer to Patient Instructions for Blood Glucose Monitoring and Insulin/Medications Dosing Guide"  in media tab for additional information. Please  also refer to " Patient Self Inventory" in the  Media  tab for reviewed elements of pertinent patient history.  Dawn Anthony participated in the discussions, expressed understanding, and voiced agreement with the above plans.  All questions were answered to her satisfaction. she is encouraged to contact clinic should she have any questions or concerns prior to her return visit.     Follow up plan: - Return in about 3 months (around 02/06/2023) for Diabetes F/U with A1c in office, Bring meter and logs, Thyroid follow up, Previsit labs.   Ronny Bacon, [facility] [address] [place], [place] [address] Phone: [phone] Fax: [phone]  11/06/2022, 1:42 PM

## 2022-11-21 ENCOUNTER — Telehealth: Payer: Medicare Other

## 2022-11-22 DIAGNOSIS — I10 Essential (primary) hypertension: Secondary | ICD-10-CM | POA: Diagnosis not present

## 2022-11-22 DIAGNOSIS — Z794 Long term (current) use of insulin: Secondary | ICD-10-CM | POA: Diagnosis not present

## 2022-11-22 DIAGNOSIS — E1159 Type 2 diabetes mellitus with other circulatory complications: Secondary | ICD-10-CM

## 2022-11-25 ENCOUNTER — Other Ambulatory Visit: Payer: Self-pay | Admitting: Family Medicine

## 2022-11-26 ENCOUNTER — Other Ambulatory Visit: Payer: Self-pay | Admitting: Pharmacist

## 2022-11-26 DIAGNOSIS — E119 Type 2 diabetes mellitus without complications: Secondary | ICD-10-CM

## 2022-11-26 MED ORDER — CARVEDILOL 12.5 MG PO TABS: 12.5000 mg | ORAL_TABLET | Freq: Two times a day (BID) | ORAL | 1 refills | Status: DC

## 2022-11-26 NOTE — Progress Notes (Signed)
11/26/2022 Name: Dawn Anthony MRN: [id number] DOB: 1950/11/20  Chief Complaint  Patient presents with   Diabetes    Dawn Anthony is a 72 y.o. year old female who presented for a telephone visit.   They were referred to the pharmacist by their PCP for assistance in managing diabetes.   Subjective:  Care Team: Primary Care Provider: Sonny Masters, FNP ; Next Scheduled Visit: 12/2022 Endocrinologist Lurlean Leyden, FNP ; Next Scheduled Visit: 01/2023  Medication Access/Adherence  Current Pharmacy:  [facility] [phone] - [place], [place] - [address] [place] [address] Phone: [phone] Fax: [phone]   Patient reports affordability concerns with their medications: No  Patient reports access/transportation concerns to their pharmacy: No  Patient reports adherence concerns with their medications:  No     Diabetes:  Current medications: tresiba, humalog, metformin Medications tried in the past: SGLT2 d/c'd by endo to simplify regimen; endo does not want GLP1 due to body habitus  Current glucose readings: up to 221 Using libre 3 CGM meter   Patient denies hypoglycemic s/sx including dizziness, shakiness, sweating. Patient denies hyperglycemic symptoms including polyuria, polydipsia, polyphagia, nocturia, neuropathy, blurred vision.  Current physical activity: encouraged as able Diet:patient reports dental situation/lack of teeth make eating/food choices difficult  Objective:  Lab Results  Component Value Date   HGBA1C 9.8 (H) 08/28/2022    Lab Results  Component Value Date   CREATININE 0.74 09/25/2022   BUN 19 09/25/2022   NA 145 (H) 09/25/2022   K 4.6 09/25/2022   CL 105 09/25/2022   CO2 22 09/25/2022    Lab Results  Component Value Date   CHOL 129 08/28/2022   HDL 70 08/28/2022   LDLCALC 46 08/28/2022   TRIG 63 08/28/2022   CHOLHDL 1.8 08/28/2022    Medications Reviewed Today     Reviewed by Dani Gobble,  NP (Nurse Practitioner) on 11/06/22 at 1335  Med List Status: <None>   Medication Order Taking? Sig Documenting Provider Last Dose Status Informant  aspirin EC 81 MG tablet [id number] Yes Take 81 mg by mouth daily. Swallow whole. [provider] Taking Active   carvedilol (COREG) 12.5 MG tablet [id number] Yes Take 12.5 mg by mouth 2 (two) times daily with a meal. [provider] Taking Active Self  cholecalciferol (VITAMIN D3) 25 MCG (1000 UT) tablet [id number] Yes Take 50 Units by mouth daily. [provider] Taking Active Self  Continuous Glucose Sensor (FREESTYLE LIBRE 3 SENSOR) [place] [id number]  Place 1 sensor on the skin every 14 days. Use to check glucose continuously. DX: E11.65 Dani Gobble, NP  Active   insulin degludec (TRESIBA FLEXTOUCH) 100 UNIT/ML FlexTouch Pen [id number]  Inject 15 Units into the skin at bedtime. Dani Gobble, NP  Active   insulin lispro (HUMALOG KWIKPEN) 100 UNIT/ML KwikPen [id number]  Inject 4-10 Units into the skin 3 (three) times daily. Dani Gobble, NP  Active   Insulin Pen Needle (BD PEN NEEDLE NANO U/F) 32G X 4 MM MISC [id number]  UAD QID Dx E11.69 Dani Gobble, NP  Active   levothyroxine (SYNTHROID) 75 MCG tablet [id number]  Take 1 tablet (75 mcg total) by mouth daily before breakfast. Dani Gobble, NP  Active   lisinopril (ZESTRIL) 5 MG tablet [id number] Yes Take 1 tablet (5 mg total) by mouth daily. Dawn Masters, FNP Taking Active   metFORMIN (GLUCOPHAGE) 1000 MG tablet [id number]  Take 1 tablet (1,000 mg total) by mouth 2 (  two) times daily with a meal. Dani Gobble, NP  Active   rosuvastatin (CRESTOR) 20 MG tablet [id number] Yes Take 20 mg by mouth daily. [provider] Taking Active               Assessment/Plan:   Diabetes: - Currently uncontrolled  Patient follows with endo  Her readings remain elevated up to 200s, but have improved  Encouraged patient to reach out to endo for  insulin adjustments prior to appt in August due to elevated blood sugars  Patient now on Tresiba U100 (was previously on U200 as was confused--clarified)  Continue current regimen - Reviewed long term cardiovascular and renal outcomes of uncontrolled blood sugar - Reviewed goal A1c, goal fasting, and goal 2 hour post prandial glucose   Kieth Brightly, PharmD, BCACP Clinical Pharmacist, [facility]

## 2022-12-31 ENCOUNTER — Encounter: Payer: Self-pay | Admitting: Family Medicine

## 2022-12-31 ENCOUNTER — Ambulatory Visit (INDEPENDENT_AMBULATORY_CARE_PROVIDER_SITE_OTHER): Payer: Medicare Other | Admitting: Family Medicine

## 2022-12-31 VITALS — BP 139/90 | HR 72 | Temp 97.4°F | Ht 61.0 in | Wt 148.8 lb

## 2022-12-31 DIAGNOSIS — E1169 Type 2 diabetes mellitus with other specified complication: Secondary | ICD-10-CM | POA: Diagnosis not present

## 2022-12-31 DIAGNOSIS — I152 Hypertension secondary to endocrine disorders: Secondary | ICD-10-CM | POA: Diagnosis not present

## 2022-12-31 DIAGNOSIS — E785 Hyperlipidemia, unspecified: Secondary | ICD-10-CM

## 2022-12-31 DIAGNOSIS — Z794 Long term (current) use of insulin: Secondary | ICD-10-CM

## 2022-12-31 DIAGNOSIS — E1159 Type 2 diabetes mellitus with other circulatory complications: Secondary | ICD-10-CM | POA: Diagnosis not present

## 2022-12-31 LAB — CBC WITH DIFFERENTIAL/PLATELET
Basophils Absolute: 0.1 10*3/uL (ref 0.0–0.2)
Basos: 1 %
EOS (ABSOLUTE): 0.2 10*3/uL (ref 0.0–0.4)
Eos: 4 %
Hematocrit: 39.4 % (ref 34.0–46.6)
Hemoglobin: 13.1 g/dL (ref 11.1–15.9)
Immature Grans (Abs): 0 10*3/uL (ref 0.0–0.1)
Immature Granulocytes: 0 %
Lymphocytes Absolute: 2 10*3/uL (ref 0.7–3.1)
Lymphs: 31 %
MCH: 29.5 pg (ref 26.6–33.0)
MCHC: 33.2 g/dL (ref 31.5–35.7)
MCV: 89 fL (ref 79–97)
Monocytes Absolute: 0.4 10*3/uL (ref 0.1–0.9)
Monocytes: 6 %
Neutrophils Absolute: 3.9 10*3/uL (ref 1.4–7.0)
Neutrophils: 58 %
Platelets: 254 10*3/uL (ref 150–450)
RBC: 4.44 x10E6/uL (ref 3.77–5.28)
RDW: 13.5 % (ref 11.7–15.4)
WBC: 6.6 10*3/uL (ref 3.4–10.8)

## 2022-12-31 LAB — BMP8+EGFR
BUN/Creatinine Ratio: 30 — ABNORMAL HIGH (ref 12–28)
BUN: 21 mg/dL (ref 8–27)
CO2: 22 mmol/L (ref 20–29)
Calcium: 10 mg/dL (ref 8.7–10.3)
Chloride: 103 mmol/L (ref 96–106)
Creatinine, Ser: 0.7 mg/dL (ref 0.57–1.00)
Glucose: 158 mg/dL — ABNORMAL HIGH (ref 70–99)
Potassium: 4.7 mmol/L (ref 3.5–5.2)
Sodium: 141 mmol/L (ref 134–144)
eGFR: 93 mL/min/{1.73_m2} (ref >59)

## 2022-12-31 LAB — LIPID PANEL
Chol/HDL Ratio: 1.8 ratio (ref 0.0–4.4)
Cholesterol, Total: 146 mg/dL (ref 100–199)
HDL: 80 mg/dL (ref >39)
LDL Chol Calc (NIH): 52 mg/dL (ref 0–99)
Triglycerides: 73 mg/dL (ref 0–149)
VLDL Cholesterol Cal: 14 mg/dL (ref 5–40)

## 2022-12-31 NOTE — Patient Instructions (Signed)

## 2022-12-31 NOTE — Progress Notes (Signed)
Subjective:  Patient ID: Dawn Anthony, female    DOB: Oct 08, 1950, 72 y.o.   MRN: [id number]  Patient Care Team: Sonny Masters, FNP as PCP - General (Family Medicine) Audrie Gallus, RN as [facility] Care Management   Chief Complaint:  Hypertension (3 month follow up )   HPI: Dawn Anthony is a 71 y.o. female presenting on 12/31/2022 for Hypertension (3 month follow up )   1. Hypertension associated with diabetes (HCC) Complaint with meds - Yes Current Medications - Lisinopril and Coreg Checking BP at home - no Exercising Regularly - No Watching Salt intake - Yes Pertinent ROS:  Headache - No Fatigue - No Visual Disturbances - No Chest pain - No Dyspnea - No Palpitations - No LE edema - No They report good compliance with medications and can restate their regimen by memory. No medication side effects.  BP Readings from Last 3 Encounters:  12/31/22 (!) 139/90  11/06/22 134/80  10/30/22 138/88     2. Type 2 diabetes mellitus with other specified complication, with long-term current use of insulin (HCC) She has established with endocrinology and does see them on a regular basis. She is compliant with her medications without associated side effects.   3. Hyperlipidemia associated with type 2 diabetes mellitus (HCC) Compliant with medications - Yes Current medications - Crestor Side effects from medications - No Diet - generally healthy  Exercise - not regular        Relevant past medical, surgical, family, and social history reviewed and updated as indicated.  Allergies and medications reviewed and updated. Data reviewed: Chart in Epic.   Past Medical History:  Diagnosis Date   Diabetes mellitus without complication (HCC)    Gastritis    Hyperlipidemia    Hypertension    Thyroid disease     Past Surgical History:  Procedure Laterality Date   CORONARY ARTERY BYPASS GRAFT  2022   EYE SURGERY     KNEE SURGERY Bilateral    OTHER  SURGICAL HISTORY     shoulder surgery   THYROID SURGERY      Social History   Socioeconomic History   Marital status: Widowed    Spouse name: Not on file   Number of children: 1   Years of education: Not on file   Highest education level: 12th grade  Occupational History   Not on file  Tobacco Use   Smoking status: Never   Smokeless tobacco: Never  Vaping Use   Vaping Use: Never used  Substance and Sexual Activity   Alcohol use: Never   Drug use: Never   Sexual activity: Not Currently  Other Topics Concern   Not on file  Social History Narrative   Grandson lives with her.    Social Determinants of Health   Financial Resource Strain: Low Risk  (09/30/2022)   Overall Financial Resource Strain (CARDIA)    Difficulty of Paying Living Expenses: Not hard at all  Food Insecurity: No Food Insecurity (09/30/2022)   Hunger Vital Sign    Worried About Running Out of Food in the Last Year: Never true    Ran Out of Food in the Last Year: Never true  Recent Concern: Food Insecurity - Food Insecurity Present (09/23/2022)   Hunger Vital Sign    Worried About Running Out of Food in the Last Year: Sometimes true    Ran Out of Food in the Last Year: Sometimes true  Transportation Needs: No Transportation Needs (09/30/2022)  PRAPARE - Administrator, Civil Service (Medical): No    Lack of Transportation (Non-Medical): No  Physical Activity: Insufficiently Active (09/30/2022)   Exercise Vital Sign    Days of Exercise per Week: 3 days    Minutes of Exercise per Session: 30 min  Stress: No Stress Concern Present (09/30/2022)   [employer] of Occupational Health - Occupational Stress Questionnaire    Feeling of Stress : Not at all  Social Connections: Socially Isolated (09/30/2022)   Social Connection and Isolation Panel [NHANES]    Frequency of Communication with Friends and Family: More than three times a week    Frequency of Social Gatherings with Friends and Family: More than  three times a week    Attends Religious Services: Never    Database administrator or Organizations: No    Attends Banker Meetings: Never    Marital Status: Widowed  Intimate Partner Violence: Not At Risk (09/30/2022)   Humiliation, Afraid, Rape, and Kick questionnaire    Fear of Current or Ex-Partner: No    Emotionally Abused: No    Physically Abused: No    Sexually Abused: No    Outpatient Encounter Medications as of 12/31/2022  Medication Sig   aspirin EC 81 MG tablet Take 81 mg by mouth daily. Swallow whole.   carvedilol (COREG) 12.5 MG tablet Take 1 tablet (12.5 mg total) by mouth 2 (two) times daily with a meal.   cholecalciferol (VITAMIN D3) 25 MCG (1000 UT) tablet Take 50 Units by mouth daily.   Continuous Glucose Sensor (FREESTYLE LIBRE 3 SENSOR) MISC Place 1 sensor on the skin every 14 days. Use to check glucose continuously. DX: E11.65   insulin degludec (TRESIBA FLEXTOUCH) 100 UNIT/ML FlexTouch Pen Inject 15 Units into the skin at bedtime.   insulin lispro (HUMALOG KWIKPEN) 100 UNIT/ML KwikPen Inject 4-10 Units into the skin 3 (three) times daily.   Insulin Pen Needle (BD PEN NEEDLE NANO U/F) 32G X 4 MM MISC UAD QID Dx E11.69   levothyroxine (SYNTHROID) 75 MCG tablet Take 1 tablet (75 mcg total) by mouth daily before breakfast.   lisinopril (ZESTRIL) 5 MG tablet Take 1 tablet (5 mg total) by mouth daily.   metFORMIN (GLUCOPHAGE) 1000 MG tablet Take 1 tablet (1,000 mg total) by mouth 2 (two) times daily with a meal.   rosuvastatin (CRESTOR) 20 MG tablet Take 20 mg by mouth daily.   No facility-administered encounter medications on file as of 12/31/2022.    Allergies  Allergen Reactions   Hydrochlorothiazide     Other Reaction(s): Unknown-Unspecified  Stopped due to pancreatitis   Insulin Glargine-Lixisenatide Nausea And Vomiting   Simvastatin     Other Reaction(s): Unknown-Unspecified  Stopped due to pancreatitis  Stopped due to pancreatitis   Penicillins  Hives   Amoxicillin Itching and Rash   Cat Hair Extract Itching and Rash   Latex Other (See Comments) and Rash    Review of Systems  Constitutional:  Negative for activity change, appetite change, chills, diaphoresis, fatigue, fever and unexpected weight change.  HENT: Negative.    Eyes: Negative.  Negative for photophobia and visual disturbance.  Respiratory:  Negative for cough, chest tightness and shortness of breath.   Cardiovascular:  Negative for chest pain, palpitations and leg swelling.  Gastrointestinal:  Negative for abdominal pain, blood in stool, constipation, diarrhea, nausea and vomiting.  Endocrine: Negative.  Negative for polydipsia, polyphagia and polyuria.  Genitourinary:  Negative for decreased urine volume, difficulty  urinating, dysuria, frequency and urgency.  Musculoskeletal:  Negative for arthralgias and myalgias.  Skin: Negative.   Allergic/Immunologic: Negative.   Neurological:  Negative for dizziness, tremors, seizures, syncope, facial asymmetry, speech difficulty, weakness, light-headedness, numbness and headaches.  Hematological: Negative.   Psychiatric/Behavioral:  Negative for confusion, hallucinations, sleep disturbance and suicidal ideas.   All other systems reviewed and are negative.       Objective:  BP (!) 139/90   Pulse 72   Temp (!) 97.4 F (36.3 C) (Temporal)   Ht 5\' 1"  (1.549 m)   Wt 148 lb 12.8 oz (67.5 kg)   SpO2 95%   BMI 28.12 kg/m    Wt Readings from Last 3 Encounters:  12/31/22 148 lb 12.8 oz (67.5 kg)  11/06/22 146 lb 3.2 oz (66.3 kg)  10/30/22 145 lb (65.8 kg)    Physical Exam Vitals and nursing note reviewed.  Constitutional:      Appearance: Normal appearance. She is well-developed, well-groomed and overweight.  HENT:     Head: Normocephalic and atraumatic.     Mouth/Throat:     Mouth: Mucous membranes are moist.  Eyes:     Conjunctiva/sclera: Conjunctivae normal.     Pupils: Pupils are equal, round, and reactive to  light.  Cardiovascular:     Rate and Rhythm: Normal rate and regular rhythm.     Heart sounds: Normal heart sounds.     Comments: Well healed sternotomy Pulmonary:     Effort: Pulmonary effort is normal.     Breath sounds: Normal breath sounds.  Musculoskeletal:        General: Normal range of motion.     Cervical back: Normal range of motion and neck supple.     Right lower leg: No edema.     Left lower leg: No edema.  Skin:    General: Skin is warm and dry.     Capillary Refill: Capillary refill takes less than 2 seconds.  Neurological:     General: No focal deficit present.     Mental Status: She is alert and oriented to person, place, and time.  Psychiatric:        Attention and Perception: Attention and perception normal.        Mood and Affect: Mood normal.        Speech: Speech normal.        Behavior: Behavior normal. Behavior is cooperative.        Thought Content: Thought content normal.        Cognition and Memory: Cognition and memory normal.        Judgment: Judgment normal.     Results for orders placed or performed in visit on 09/25/22  BMP8+EGFR  Result Value Ref Range   Glucose 154 (H) 70 - 99 mg/dL   BUN 19 8 - 27 mg/dL   Creatinine, Ser 1.61 0.57 - 1.00 mg/dL   eGFR 87 >09 UE/AVW/0.98   BUN/Creatinine Ratio 26 12 - 28   Sodium 145 (H) 134 - 144 mmol/L   Potassium 4.6 3.5 - 5.2 mmol/L   Chloride 105 96 - 106 mmol/L   CO2 22 20 - 29 mmol/L   Calcium 9.6 8.7 - 10.3 mg/dL       Pertinent labs & imaging results that were available during my care of the patient were reviewed by me and considered in my medical decision making.  Assessment & Plan:  Dawn Anthony was seen today for hypertension.  Diagnoses and all orders for  this visit:  Hypertension associated with diabetes (HCC) BP well controlled. Changes were not made in regimen today. Goal BP is 130/80. Pt aware to report any persistent high or low readings. DASH diet and exercise encouraged. Exercise  at least 150 minutes per week and increase as tolerated. Goal BMI > 25. Stress management encouraged. Avoid nicotine and tobacco product use. Avoid excessive alcohol and NSAID's. Avoid more than 2000 mg of sodium daily. Medications as prescribed. Follow up as scheduled.  -     BMP8+EGFR -     Lipid panel -     CBC with Differential/Platelet  Type 2 diabetes mellitus with other specified complication, with long-term current use of insulin (HCC) Followed by endocrinology on a regular basis. Doing well with current regimen.  -     BMP8+EGFR -     Lipid panel -     CBC with Differential/Platelet  Hyperlipidemia associated with type 2 diabetes mellitus (HCC) Diet encouraged - increase intake of fresh fruits and vegetables, increase intake of lean proteins. Bake, broil, or grill foods. Avoid fried, greasy, and fatty foods. Avoid fast foods. Increase intake of fiber-rich whole grains. Exercise encouraged - at least 150 minutes per week and advance as tolerated.  Goal BMI < 25. Continue medications as prescribed. Follow up in 3-6 months as discussed.  -     BMP8+EGFR -     Lipid panel -     CBC with Differential/Platelet     Continue all other maintenance medications.  Follow up plan: Return in about 6 months (around 07/03/2023) for CPE.   Continue healthy lifestyle choices, including diet (rich in fruits, vegetables, and lean proteins, and low in salt and simple carbohydrates) and exercise (at least 30 minutes of moderate physical activity daily).  Educational handout given for DASH diet  The above assessment and management plan was discussed with the patient. The patient verbalized understanding of and has agreed to the management plan. Patient is aware to call the clinic if they develop any new symptoms or if symptoms persist or worsen. Patient is aware when to return to the clinic for a follow-up visit. Patient educated on when it is appropriate to go to the emergency department.   Kari Baars, FNP-C [facility] [phone]

## 2023-01-21 ENCOUNTER — Encounter: Payer: Self-pay | Admitting: Family Medicine

## 2023-01-31 ENCOUNTER — Other Ambulatory Visit: Payer: Medicare Other | Admitting: *Deleted

## 2023-01-31 ENCOUNTER — Encounter: Payer: Self-pay | Admitting: *Deleted

## 2023-01-31 ENCOUNTER — Telehealth: Payer: Medicare Other

## 2023-01-31 NOTE — Patient Instructions (Signed)
Visit Information  Thank you for taking time to visit with me today. Please don't hesitate to contact me if I can be of assistance to you before our next scheduled telephone appointment.  Following are the goals we discussed today:   Goals Addressed             This Visit's Progress    CCM (DIABETES) EXPECTED OUTCOME: MONITOR, SELF- MANAGE AND REDUCE SYMPTOMS OF DIABETES       Current Barriers:  Knowledge Deficits related to Diabetes management Chronic Disease Management support and education needs related to Diabetes, diet and nutrition No Advanced Directives in place- pt requests information, documents mailed Patient will be working with pharmacist Patient reports CBG is monitored by CGM Freestyle Libre 3 with fasting ranges 198-250 random ranges 200's pt states she did not take her insulin for a month because she fractured her right arm in a fall on 06/25/22, her grandson did not want to do the injection, pt feels this is why her AIC went up to 9.8, pt states she is working on her diet, walking daily when she is not tired and is working towards goal of lowering AIC to below 7. Patient reports she saw a dietician in the past and wants to wait and see if she can get AIC down on her own, then if she cannot will revisit the idea of seeing a nutritionist Patient reports she has been eating a lot of sandwiches and fruit and is concerned that [facility] may be no better  Planned Interventions: Reviewed medications with patient and discussed importance of medication adherence;        Counseled on importance of regular laboratory monitoring as prescribed;        Discussed plans with patient for ongoing care management follow up and provided patient with direct contact information for care management team;      Reviewed scheduled/upcoming provider appointments including: endocrinologist 02/12/23 at 230 pm;         Advised patient, providing education and rationale, to check cbg per CGM  and record         Review of patient status, including review of consultants reports, relevant laboratory and other test results, and medications completed;       Reinforced fasting blood sugar goals of 80-130 and less than 180 1 1/2-2 hours after meals Reviewed safety precautions Reviewed carbohydrate modified diet and plate method  Symptom Management: Take medications as prescribed   Attend all scheduled provider appointments Call pharmacy for medication refills 3-7 days in advance of running out of medications Attend church or other social activities Perform all self care activities independently  Call provider office for new concerns or questions  check blood sugar at prescribed times: per [facility]  check feet daily for cuts, sores or redness enter blood sugar readings and medication or insulin into daily log take the blood sugar log to all doctor visits take the blood sugar meter to all doctor visits trim toenails straight across eat fish at least once per week fill half of plate with vegetables limit fast food meals to no more than 1 per week manage portion size prepare main meal at home 3 to 5 days each week read food labels for fat, fiber, carbohydrates and portion size keep feet up while sitting wash and dry feet carefully every day wear comfortable, cotton socks wear comfortable, well-fitting shoes Goal for blood sugars- fasting blood sugar goals of 80-130 and less than 180 1 1/2-2 hours after  meals fall prevention strategies: change position slowly, use assistive device such as walker or cane (per provider recommendations) when walking, keep walkways clear, have good lighting in room. It is important to contact your provider if you have any falls, maintain muscle strength/tone by exercise per provider recommendations.  Follow Up Plan: Telephone follow up appointment with care management team member scheduled for:  03/25/23 at 215 pm       CCM (HYPERTENSION) EXPECTED OUTCOME:  MONITOR, SELF-MANAGE AND REDUCE SYMPTOMS OF HYPERTENSION       Current Barriers:  Knowledge Deficits related to Hypertension management Care Coordination needs related to pharmacy/ medication management in a patient with Hypertension Chronic Disease Management support and education needs related to Hypertension, diet No Advanced Directives in place- information previously mailed Patient reports her 5 year old grandson lives with her, provides transportation as pt is currently not driving, pt states she is independent with all aspects of her care, has a shower seat to make bathing easier. Patient reports she has blood pressure cuff and checks on occasion and states "readings have been good" Patient reports she has started walking daily for 30 minutes  Planned Interventions: Evaluation of current treatment plan related to hypertension self management and patient's adherence to plan as established by provider;   Reviewed medications with patient and discussed importance of compliance;  Counseled on the importance of exercise goals with target of 150 minutes per week Advised patient, providing education and rationale, to monitor blood pressure daily and record, calling PCP for findings outside established parameters;  Discussed complications of poorly controlled blood pressure such as heart disease, stroke, circulatory complications, vision complications, kidney impairment, sexual dysfunction;  Reviewed low sodium diet and importance of reading labels  Symptom Management: Take medications as prescribed   Attend all scheduled provider appointments Call pharmacy for medication refills 3-7 days in advance of running out of medications Attend church or other social activities Perform all self care activities independently  Call provider office for new concerns or questions  check blood pressure weekly choose a place to take my blood pressure (home, clinic or office, retail store) write blood  pressure results in a log or diary learn about high blood pressure keep a blood pressure log take blood pressure log to all doctor appointments call doctor for signs and symptoms of high blood pressure develop an action plan for high blood pressure take medications for blood pressure exactly as prescribed report new symptoms to your doctor eat more whole grains, fruits and vegetables, lean meats and healthy fats Follow low sodium diet, limit/ avoid fast food Read food labels for sodium content Advanced directives packet mailed- please look over and complete Continue walking daily- keep up the good work!  Follow Up Plan: Telephone follow up appointment with care management team member scheduled for:  03/25/23 at 215 pm            Our next appointment is by telephone on 03/25/23 at 215 pm  Please call the care guide team at [phone] if you need to cancel or reschedule your appointment.   If you are experiencing a Mental Health or Behavioral Health Crisis or need someone to talk to, please call the Suicide and Crisis Lifeline: 988 call the [place] National Suicide Prevention Lifeline: 506-821-5012 or TTY: 856-114-1289 TTY 847-010-0389) to talk to a trained counselor call 1-800-273-TALK (toll free, 24 hour hotline) go to [facility] [address], [place] [phone]) call the [facility] Crisis Line: 3016959675  call 911   Patient verbalizes understanding of instructions and care plan provided today and agrees to view in MyChart. Active MyChart status and patient understanding of how to access instructions and care plan via MyChart confirmed with patient.     Telephone scheduled for 03/25/23 at 215 pm  Irving Shows [facility] [address], BSN [place]/ Ambulatory Care Management [phone]

## 2023-01-31 NOTE — Patient Outreach (Signed)
Care Management   Visit Note  01/31/2023 Name: Dawn Anthony MRN: [id number] DOB: [birth year]  Subjective: Dawn Anthony is a 72 y.o. year old female who is a primary care patient of Rakes, Doralee Albino, FNP. The Care Management team was consulted for assistance.      Engaged with patient spoke with patient by telephone for follow up   Goals Addressed             This Visit's Progress    CCM (DIABETES) EXPECTED OUTCOME: MONITOR, SELF- MANAGE AND REDUCE SYMPTOMS OF DIABETES       Current Barriers:  Knowledge Deficits related to Diabetes management Chronic Disease Management support and education needs related to Diabetes, diet and nutrition No Advanced Directives in place- pt requests information, documents mailed Patient will be working with pharmacist Patient reports CBG is monitored by CGM Freestyle Libre 3 with fasting ranges 198-250 random ranges 200's pt states she did not take her insulin for a month because she fractured her right arm in a fall on 06/25/22, her grandson did not want to do the injection, pt feels this is why her AIC went up to 9.8, pt states she is working on her diet, walking daily when she is not tired and is working towards goal of lowering AIC to below 7. Patient reports she saw a dietician in the past and wants to wait and see if she can get AIC down on her own, then if she cannot will revisit the idea of seeing a nutritionist Patient reports she has been eating a lot of sandwiches and fruit and is concerned that [facility] may be no better  Planned Interventions: Reviewed medications with patient and discussed importance of medication adherence;        Counseled on importance of regular laboratory monitoring as prescribed;        Discussed plans with patient for ongoing care management follow up and provided patient with direct contact information for care management team;      Reviewed scheduled/upcoming provider appointments including: endocrinologist 02/12/23 at  230 pm;         Advised patient, providing education and rationale, to check cbg per CGM  and record        Review of patient status, including review of consultants reports, relevant laboratory and other test results, and medications completed;       Reinforced fasting blood sugar goals of 80-130 and less than 180 1 1/2-2 hours after meals Reviewed safety precautions Reviewed carbohydrate modified diet and plate method  Symptom Management: Take medications as prescribed   Attend all scheduled provider appointments Call pharmacy for medication refills 3-7 days in advance of running out of medications Attend church or other social activities Perform all self care activities independently  Call provider office for new concerns or questions  check blood sugar at prescribed times: per [facility]  check feet daily for cuts, sores or redness enter blood sugar readings and medication or insulin into daily log take the blood sugar log to all doctor visits take the blood sugar meter to all doctor visits trim toenails straight across eat fish at least once per week fill half of plate with vegetables limit fast food meals to no more than 1 per week manage portion size prepare main meal at home 3 to 5 days each week read food labels for fat, fiber, carbohydrates and portion size keep feet up while sitting wash and dry feet carefully every day wear comfortable, cotton socks wear comfortable,  well-fitting shoes Goal for blood sugars- fasting blood sugar goals of 80-130 and less than 180 1 1/2-2 hours after meals fall prevention strategies: change position slowly, use assistive device such as walker or cane (per provider recommendations) when walking, keep walkways clear, have good lighting in room. It is important to contact your provider if you have any falls, maintain muscle strength/tone by exercise per provider recommendations.  Follow Up Plan: Telephone follow up appointment with care  management team member scheduled for:  03/25/23 at 215 pm       CCM (HYPERTENSION) EXPECTED OUTCOME: MONITOR, SELF-MANAGE AND REDUCE SYMPTOMS OF HYPERTENSION       Current Barriers:  Knowledge Deficits related to Hypertension management Care Coordination needs related to pharmacy/ medication management in a patient with Hypertension Chronic Disease Management support and education needs related to Hypertension, diet No Advanced Directives in place- information previously mailed Patient reports her 4 year old grandson lives with her, provides transportation as pt is currently not driving, pt states she is independent with all aspects of her care, has a shower seat to make bathing easier. Patient reports she has blood pressure cuff and checks on occasion and states "readings have been good" Patient reports she has started walking daily for 30 minutes  Planned Interventions: Evaluation of current treatment plan related to hypertension self management and patient's adherence to plan as established by provider;   Reviewed medications with patient and discussed importance of compliance;  Counseled on the importance of exercise goals with target of 150 minutes per week Advised patient, providing education and rationale, to monitor blood pressure daily and record, calling PCP for findings outside established parameters;  Discussed complications of poorly controlled blood pressure such as heart disease, stroke, circulatory complications, vision complications, kidney impairment, sexual dysfunction;  Reviewed low sodium diet and importance of reading labels  Symptom Management: Take medications as prescribed   Attend all scheduled provider appointments Call pharmacy for medication refills 3-7 days in advance of running out of medications Attend church or other social activities Perform all self care activities independently  Call provider office for new concerns or questions  check blood pressure  weekly choose a place to take my blood pressure (home, clinic or office, retail store) write blood pressure results in a log or diary learn about high blood pressure keep a blood pressure log take blood pressure log to all doctor appointments call doctor for signs and symptoms of high blood pressure develop an action plan for high blood pressure take medications for blood pressure exactly as prescribed report new symptoms to your doctor eat more whole grains, fruits and vegetables, lean meats and healthy fats Follow low sodium diet, limit/ avoid fast food Read food labels for sodium content Advanced directives packet mailed- please look over and complete Continue walking daily- keep up the good work!  Follow Up Plan: Telephone follow up appointment with care management team member scheduled for:  03/25/23 at 215 pm            Plan: Telephone follow up appointment with care management team member scheduled for: 03/25/23 at 215 pm  Irving Shows [facility], BSN [place]/ Ambulatory Care Management [phone]

## 2023-02-04 ENCOUNTER — Encounter: Payer: Self-pay | Admitting: Nurse Practitioner

## 2023-02-04 NOTE — Telephone Encounter (Signed)
Can you please mail a copy of lab orders to patient?

## 2023-02-05 ENCOUNTER — Other Ambulatory Visit: Payer: Medicare Other

## 2023-02-05 DIAGNOSIS — E1165 Type 2 diabetes mellitus with hyperglycemia: Secondary | ICD-10-CM | POA: Diagnosis not present

## 2023-02-05 DIAGNOSIS — E890 Postprocedural hypothyroidism: Secondary | ICD-10-CM | POA: Diagnosis not present

## 2023-02-05 DIAGNOSIS — Z794 Long term (current) use of insulin: Secondary | ICD-10-CM | POA: Diagnosis not present

## 2023-02-12 ENCOUNTER — Encounter: Payer: Self-pay | Admitting: Nurse Practitioner

## 2023-02-12 ENCOUNTER — Ambulatory Visit: Payer: Medicare Other | Admitting: Nurse Practitioner

## 2023-02-12 VITALS — BP 138/82 | HR 73 | Ht 61.0 in | Wt 150.8 lb

## 2023-02-12 DIAGNOSIS — E890 Postprocedural hypothyroidism: Secondary | ICD-10-CM

## 2023-02-12 DIAGNOSIS — E1165 Type 2 diabetes mellitus with hyperglycemia: Secondary | ICD-10-CM

## 2023-02-12 DIAGNOSIS — Z794 Long term (current) use of insulin: Secondary | ICD-10-CM | POA: Diagnosis not present

## 2023-02-12 DIAGNOSIS — Z7984 Long term (current) use of oral hypoglycemic drugs: Secondary | ICD-10-CM | POA: Diagnosis not present

## 2023-02-12 LAB — POCT GLYCOSYLATED HEMOGLOBIN (HGB A1C): Hemoglobin A1C: 9.7 % — AB (ref 4.0–5.6)

## 2023-02-12 MED ORDER — TRESIBA FLEXTOUCH 100 UNIT/ML ~~LOC~~ SOPN: 20.0000 [IU] | PEN_INJECTOR | Freq: Every day | SUBCUTANEOUS | Status: DC

## 2023-02-12 MED ORDER — FREESTYLE LIBRE 3 SENSOR MISC
3 refills | Status: DC
Start: 2023-02-12 — End: 2023-05-08

## 2023-02-12 MED ORDER — INSULIN LISPRO (1 UNIT DIAL) 100 UNIT/ML (KWIKPEN): 5.0000 [IU] | PEN_INJECTOR | Freq: Three times a day (TID) | SUBCUTANEOUS | 3 refills | Status: DC

## 2023-02-12 MED ORDER — LEVOTHYROXINE SODIUM 75 MCG PO TABS: 75.0000 ug | ORAL_TABLET | Freq: Every day | ORAL | 1 refills | Status: DC

## 2023-02-12 NOTE — Progress Notes (Signed)
Endocrinology Follow Up Note       02/12/2023, 2:50 PM   Subjective:    Patient ID: Dawn Anthony, female    DOB: 08-23-1950.  Dawn Anthony is being seen in follow up after being seen in consultation for management of currently uncontrolled symptomatic diabetes requested by  Dawn Masters, FNP.   Past Medical History:  Diagnosis Date   Diabetes mellitus without complication (HCC)    Gastritis    Hyperlipidemia    Hypertension    Thyroid disease     Past Surgical History:  Procedure Laterality Date   CORONARY ARTERY BYPASS GRAFT  2022   EYE SURGERY     KNEE SURGERY Bilateral    OTHER SURGICAL HISTORY     shoulder surgery   THYROID SURGERY      Social History   Socioeconomic History   Marital status: Widowed    Spouse name: Not on file   Number of children: 1   Years of education: Not on file   Highest education level: 12th grade  Occupational History   Not on file  Tobacco Use   Smoking status: Never   Smokeless tobacco: Never  Vaping Use   Vaping status: Never Used  Substance and Sexual Activity   Alcohol use: Never   Drug use: Never   Sexual activity: Not Currently  Other Topics Concern   Not on file  Social History Narrative   Grandson lives with her.    Social Determinants of Health   Financial Resource Strain: Low Risk  (09/30/2022)   Overall Financial Resource Strain (CARDIA)    Difficulty of Paying Living Expenses: Not hard at all  Food Insecurity: No Food Insecurity (09/30/2022)   Hunger Vital Sign    Worried About Running Out of Food in the Last Year: Never true    Ran Out of Food in the Last Year: Never true  Recent Concern: Food Insecurity - Food Insecurity Present (09/23/2022)   Hunger Vital Sign    Worried About Running Out of Food in the Last Year: Sometimes true    Ran Out of Food in the Last Year: Sometimes true  Transportation Needs: No Transportation Needs  (09/30/2022)   PRAPARE - Administrator, Civil Service (Medical): No    Lack of Transportation (Non-Medical): No  Physical Activity: Insufficiently Active (09/30/2022)   Exercise Vital Sign    Days of Exercise per Week: 3 days    Minutes of Exercise per Session: 30 min  Stress: No Stress Concern Present (09/30/2022)   [employer] of Occupational Health - Occupational Stress Questionnaire    Feeling of Stress : Not at all  Social Connections: Socially Isolated (09/30/2022)   Social Connection and Isolation Panel [NHANES]    Frequency of Communication with Friends and Family: More than three times a week    Frequency of Social Gatherings with Friends and Family: More than three times a week    Attends Religious Services: Never    Database administrator or Organizations: No    Attends Banker Meetings: Never    Marital Status: Widowed    Family History  Problem Relation Age of Onset  Diabetes Mother    Heart disease Mother    Heart failure Mother    Heart attack Father    Heart disease Father    COPD Father    Diabetes Father    Dementia Sister    Heart attack Sister    Hypertension Sister    Diabetes Brother    Heart attack Brother    Alcohol abuse Brother    Cervical cancer Maternal Grandmother    Diabetes Paternal Grandfather     Outpatient Encounter Medications as of 02/12/2023  Medication Sig   aspirin EC 81 MG tablet Take 81 mg by mouth daily. Swallow whole.   carvedilol (COREG) 12.5 MG tablet Take 1 tablet (12.5 mg total) by mouth 2 (two) times daily with a meal.   cholecalciferol (VITAMIN D3) 25 MCG (1000 UT) tablet Take 50 Units by mouth daily.   Insulin Pen Needle (BD PEN NEEDLE NANO U/F) 32G X 4 MM MISC UAD QID Dx E11.69   lisinopril (ZESTRIL) 5 MG tablet Take 1 tablet (5 mg total) by mouth daily.   metFORMIN (GLUCOPHAGE) 1000 MG tablet Take 1 tablet (1,000 mg total) by mouth 2 (two) times daily with a meal.   rosuvastatin (CRESTOR) 20 MG  tablet Take 20 mg by mouth daily.   [DISCONTINUED] Continuous Glucose Sensor (FREESTYLE LIBRE 3 SENSOR) MISC Place 1 sensor on the skin every 14 days. Use to check glucose continuously. DX: E11.65   [DISCONTINUED] insulin degludec (TRESIBA FLEXTOUCH) 100 UNIT/ML FlexTouch Pen Inject 15 Units into the skin at bedtime.   [DISCONTINUED] levothyroxine (SYNTHROID) 75 MCG tablet Take 1 tablet (75 mcg total) by mouth daily before breakfast.   Continuous Glucose Sensor (FREESTYLE LIBRE 3 SENSOR) MISC Place 1 sensor on the skin every 14 days. Use to check glucose continuously. DX: E11.65   insulin degludec (TRESIBA FLEXTOUCH) 100 UNIT/ML FlexTouch Pen Inject 20 Units into the skin at bedtime.   insulin lispro (HUMALOG KWIKPEN) 100 UNIT/ML KwikPen Inject 5-11 Units into the skin 3 (three) times daily.   levothyroxine (SYNTHROID) 75 MCG tablet Take 1 tablet (75 mcg total) by mouth daily before breakfast.   [DISCONTINUED] insulin lispro (HUMALOG KWIKPEN) 100 UNIT/ML KwikPen Inject 4-10 Units into the skin 3 (three) times daily.   No facility-administered encounter medications on file as of 02/12/2023.    ALLERGIES: Allergies  Allergen Reactions   Hydrochlorothiazide     Other Reaction(s): Unknown-Unspecified  Stopped due to pancreatitis   Insulin Glargine-Lixisenatide Nausea And Vomiting   Simvastatin     Other Reaction(s): Unknown-Unspecified  Stopped due to pancreatitis  Stopped due to pancreatitis   Penicillins Hives   Amoxicillin Itching and Rash   Cat Hair Extract Itching and Rash   Latex Other (See Comments) and Rash    VACCINATION STATUS: Immunization History  Administered Date(s) Administered   Influenza Split 04/24/2013, 03/07/2017, 03/27/2018   Influenza, High Dose Seasonal PF 03/08/2021, 03/08/2021   Influenza, Quadrivalent, Recombinant, Inj, Pf 03/08/2021   Influenza, Seasonal, Injecte, Preservative Fre 04/27/2015, 02/28/2017   Influenza,inj,Quad PF,6+ Mos 04/02/2020    Influenza,trivalent, recombinat, inj, PF 04/24/2013, 03/07/2017, 03/27/2018   Influenza-Unspecified 04/27/2015, 02/28/2017, 03/08/2021   Moderna Sars-Covid-2 Vaccination 11/01/2019, 12/09/2019   PFIZER(Purple Top)SARS-COV-2 Vaccination 06/13/2020   Pneumococcal Conjugate-13 04/07/2018   Pneumococcal Polysaccharide-23 11/21/2007, 10/25/2019   Tdap 12/10/2006, 04/24/2017   Zoster, Live 03/22/2013    Diabetes She presents for her follow-up diabetic visit. Onset time: diagnosed at approx age of 55. Her disease course has been stable. There are no  hypoglycemic associated symptoms. There are no diabetic associated symptoms. There are no hypoglycemic complications. 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 Risk factors for coronary artery disease include diabetes mellitus, dyslipidemia, family history, hypertension, sedentary lifestyle and post-menopausal. Current diabetic treatment includes intensive insulin program and oral agent (monotherapy). She is compliant with treatment most of the time. Her weight is fluctuating minimally. She is following a generally healthy diet. When asked about meal planning, she reported none. She has not had a previous visit with a dietitian. She participates in exercise intermittently. Her home blood glucose trend is fluctuating minimally. Her overall blood glucose range is >200 mg/dl. (She presents today with her CGM showing slightly above target glycemic profile overall.  Her POCT A1c today is 9.7%, improving slightly from last visit of 9.8%.  Analysis of her CGM shows TIR 40%, TAR 60%, TBR 0% with a GMI of 8.3%.  She denies any significant hypoglycemia.  She does admit she has been under more stress lately.  She still has hard time limiting her sweet tea (1/2 sweet 1/2 unsweet).) An ACE inhibitor/angiotensin II receptor blocker is being taken. She does not see a podiatrist.Eye exam is current.    Review of systems  Constitutional: + Minimally  fluctuating body weight,  current Body mass index is 28.49 kg/m. , no fatigue, no subjective hyperthermia, no subjective hypothermia Eyes: no blurry vision, no xerophthalmia ENT: no sore throat, no nodules palpated in throat, no dysphagia/odynophagia, no hoarseness Cardiovascular: no chest pain, no shortness of breath, no palpitations, no leg swelling Respiratory: no cough, no shortness of breath Gastrointestinal: no nausea/vomiting/diarrhea Musculoskeletal: no muscle/joint aches Skin: no rashes, no hyperemia Neurological: no tremors, no numbness, no tingling, no dizziness Psychiatric: no depression, no anxiety  Objective:     BP 138/82 (BP Location: Right Arm, Patient Position: Sitting, Cuff Size: Large)   Pulse 73   Ht 5\' 1"  (1.549 m)   Wt 150 lb 12.8 oz (68.4 kg)   BMI 28.49 kg/m   Wt Readings from Last 3 Encounters:  02/12/23 150 lb 12.8 oz (68.4 kg)  12/31/22 148 lb 12.8 oz (67.5 kg)  11/06/22 146 lb 3.2 oz (66.3 kg)     BP Readings from Last 3 Encounters:  02/12/23 138/82  12/31/22 (!) 139/90  11/06/22 134/80     Physical Exam- Limited  Constitutional:  Body mass index is 28.49 kg/m. , not in acute distress, normal state of mind Eyes:  EOMI, no exophthalmos Musculoskeletal: no gross deformities, strength intact in all four extremities, no gross restriction of joint movements Skin:  no rashes, no hyperemia Neurological: no tremor with outstretched hands   Diabetic Foot Exam - Simple   No data filed     CMP ( most recent) CMP     Component Value Date/Time   NA 142 02/05/2023 1025   K 5.1 02/05/2023 1025   CL 105 02/05/2023 1025   CO2 22 02/05/2023 1025   GLUCOSE 119 (H) 02/05/2023 1025   GLUCOSE 265 (H) 07/09/2019 1456   BUN 17 02/05/2023 1025   CREATININE 0.82 02/05/2023 1025   CALCIUM 9.8 02/05/2023 1025   PROT 6.2 02/05/2023 1025   ALBUMIN 4.3 02/05/2023 1025   AST 17 02/05/2023 1025   ALT 9 02/05/2023 1025   ALKPHOS 69 02/05/2023 1025    BILITOT 0.4 02/05/2023 1025   GFRNONAA >60 07/09/2019 1456   GFRAA >60 07/09/2019 1456     Diabetic Labs (most recent): Lab Results  Component Value  Date   HGBA1C 9.7 (A) 02/12/2023   HGBA1C 9.8 (H) 08/28/2022     Lipid Panel ( most recent) Lipid Panel     Component Value Date/Time   CHOL 146 12/31/2022 0830   TRIG 73 12/31/2022 0830   HDL 80 12/31/2022 0830   CHOLHDL 1.8 12/31/2022 0830   LDLCALC 52 12/31/2022 0830   LABVLDL 14 12/31/2022 0830      Lab Results  Component Value Date   TSH 1.080 02/05/2023   TSH 1.530 08/28/2022   FREET4 1.74 02/05/2023           Assessment & Plan:   1) Type 2 diabetes mellitus with hyperglycemia, with long-term current use of insulin  She presents today with her CGM showing slightly above target glycemic profile overall.  Her POCT A1c today is 9.7%, improving slightly from last visit of 9.8%.  Analysis of her CGM shows TIR 40%, TAR 60%, TBR 0% with a GMI of 8.3%.  She denies any significant hypoglycemia.  She does admit she has been under more stress lately.  She still has hard time limiting her sweet tea (1/2 sweet 1/2 unsweet).  - Dawn Anthony has currently uncontrolled symptomatic type 2 DM since 72 years of age.   -Recent labs reviewed.  - I had a long discussion with her about the progressive nature of diabetes and the pathology behind its complications. -her diabetes is complicated by CAD with CABG and she remains at a high risk for more acute and chronic complications which include CAD, CVA, CKD, retinopathy, and neuropathy. These are all discussed in detail with her.  The following Lifestyle Medicine recommendations according to American College of Lifestyle Medicine [facility]) were discussed and offered to patient and she agrees to start the journey:  A. Whole Foods, Plant-based plate comprising of fruits and vegetables, plant-based proteins, whole-grain carbohydrates was discussed in detail with the patient.   A list for  source of those nutrients were also provided to the patient.  Patient will use only water or unsweetened tea for hydration. B.  The need to stay away from risky substances including alcohol, smoking; obtaining 7 to 9 hours of restorative sleep, at least 150 minutes of moderate intensity exercise weekly, the importance of healthy social connections,  and stress reduction techniques were discussed. C.  A full color page of  Calorie density of various food groups per pound showing examples of each food groups was provided to the patient.  - Nutritional counseling repeated at each appointment due to patients tendency to fall back in to old habits.  - The patient admits there is a room for improvement in their diet and drink choices. -  Suggestion is made for the patient to avoid simple carbohydrates from their diet including Cakes, Sweet Desserts / Pastries, Ice Cream, Soda (diet and regular), Sweet Tea, Candies, Chips, Cookies, Sweet Pastries, Store Bought Juices, Alcohol in Excess of 1-2 drinks a day, Artificial Sweeteners, Coffee Creamer, and "Sugar-free" Products. This will help patient to have stable blood glucose profile and potentially avoid unintended weight gain.   - I encouraged the patient to switch to unprocessed or minimally processed complex starch and increased protein intake (animal or plant source), fruits, and vegetables.   - Patient is advised to stick to a routine mealtimes to eat 3 meals a day and avoid unnecessary snacks (to snack only to correct hypoglycemia).  - I have approached her with the following individualized plan to manage her diabetes and patient agrees:   -  Due to her hyperglycemia, she will tolerate slight adjustment in her Dawn Anthony to 20 units SQ nightly and Humalog to 5-11 units TID with meals if glucose is above 90 and she is eating (Specific instructions on how to titrate insulin dosage based on glucose readings given to patient in writing).   She can continue her  Metformin 1000 mg po twice daily with meals.   -she is encouraged to start monitoring glucose 4 times daily (using her CGM), before meals and before bed, and to call the clinic if she has readings less than 70 or above 300 for 3 tests in a row.  - she is warned not to take insulin without proper monitoring per orders. - Adjustment parameters are given to her for hypo and hyperglycemia in writing.  - her Dawn Anthony was previously discontinued to de-escalate her treatment regimen.  - she is not ideal candidate for incretin therapy due to body habitus.   - Specific targets for  A1c; LDL, HDL, and Triglycerides were discussed with the patient.  2) Blood Pressure /Hypertension:  her blood pressure is controlled to target.   she is advised to continue her current medications including Coreg 12.5 mg po twice daily and Lisinopril 5 mg p.o. daily with breakfast.  3) Lipids/Hyperlipidemia:    Review of her recent lipid panel from 08/28/22 showed controlled LDL at 46 .  she is advised to continue Crestor 20 mg daily at bedtime.  Side effects and precautions discussed with her.  4)  Weight/Diet:  her Body mass index is 28.49 kg/m.  -  she is NOT a candidate for weight loss.   Exercise, and detailed carbohydrates information provided  -  detailed on discharge instructions.  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    She is currently on Levothyroxine 75 mcg po daily before breakfast.  Her recent TFTs are consistent with appropriate hormone replacement, she is advised to continue.   - The correct intake of thyroid hormone (Levothyroxine, Synthroid), is on empty stomach first thing in the morning, with water, separated by at least 30 minutes from breakfast and other medications,  and separated by more than 4 hours from calcium, iron, multivitamins, acid reflux medications (PPIs).  - This medication is a life-long medication and will be needed to correct thyroid  hormone imbalances for the rest of your life.  The dose may change from time to time, based on thyroid blood work.  - It is extremely important to be consistent taking this medication, near the same time each morning.  -AVOID TAKING PRODUCTS CONTAINING BIOTIN (commonly found in Hair, Skin, Nails vitamins) AS IT INTERFERES WITH THE VALIDITY OF THYROID FUNCTION BLOOD TESTS.  6) Chronic Care/Health Maintenance: -she is on ACEI/ARB and Statin medications and is encouraged to initiate and continue to follow up with Ophthalmology, Dentist, Podiatrist at least yearly or according to recommendations, and advised to stay away from smoking. I have recommended yearly flu vaccine and pneumonia vaccine at least every 5 years; moderate intensity exercise for up to 150 minutes weekly; and sleep for at least 7 hours a day.  - she is advised to maintain close follow up with Dawn Anthony, Dawn Albino, FNP for primary care needs, as well as her other providers for optimal and coordinated care.      I spent  31  minutes in the care of the patient today including review of labs from CMP, Lipids, Thyroid Function, Hematology (current and previous including abstractions  from other facilities); face-to-face time discussing  her blood glucose readings/logs, discussing hypoglycemia and hyperglycemia episodes and symptoms, medications doses, her options of short and long term treatment based on the latest standards of care / guidelines;  discussion about incorporating lifestyle medicine;  and documenting the encounter. Risk reduction counseling performed per USPSTF guidelines to reduce obesity and cardiovascular risk factors.     Please refer to Patient Instructions for Blood Glucose Monitoring and Insulin/Medications Dosing Guide"  in media tab for additional information. Please  also refer to " Patient Self Inventory" in the Media  tab for reviewed elements of pertinent patient history.  Dawn Anthony participated in the  discussions, expressed understanding, and voiced agreement with the above plans.  All questions were answered to her satisfaction. she is encouraged to contact clinic should she have any questions or concerns prior to her return visit.     Follow up plan: - Return in about 3 months (around 05/15/2023) for Diabetes F/U with A1c in office, No previsit labs, Bring meter and logs.   Dawn Anthony, [facility] [address] [place], [place] [address] Phone: [phone] Fax: [phone]  02/12/2023, 2:50 PM

## 2023-02-12 NOTE — Patient Instructions (Signed)

## 2023-03-25 ENCOUNTER — Encounter: Payer: Self-pay | Admitting: *Deleted

## 2023-03-25 ENCOUNTER — Other Ambulatory Visit: Payer: Medicare Other | Admitting: *Deleted

## 2023-03-25 NOTE — Patient Outreach (Signed)
Care Management   Visit Note  03/25/2023 Name: Dawn Anthony MRN: [id number] DOB: 09/14/1950  Subjective: Dawn Anthony is a 72 y.o. year old female who is a primary care patient of Rakes, Doralee Albino, FNP. The Care Management team was consulted for assistance.      Engaged with patient spoke with patient by telephone for follow up   Goals Addressed             This Visit's Progress    CCM (DIABETES) EXPECTED OUTCOME: MONITOR, SELF- MANAGE AND REDUCE SYMPTOMS OF DIABETES       Current Barriers:  Knowledge Deficits related to Diabetes management Chronic Disease Management support and education needs related to Diabetes, diet and nutrition No Advanced Directives in place- pt requests information, documents mailed Patient will be working with pharmacist Patient reports CBG is monitored by CGM Freestyle Libre 3 with fasting ranges 178-200, random ranges 160-170, pt states she did not take her insulin for a month because she fractured her right arm in a fall on 06/25/22, her grandson did not want to do the injection, pt feels this is why her AIC went up to 9.8, pt states she is working on her diet, walking daily when she is not tired and is working towards goal of lowering AIC to below 7. Patient reports she saw a dietician in the past and wants to wait and see if she can get AIC down on her own, then if she cannot will revisit the idea of seeing a nutritionist Patient reports no new concerns  Planned Interventions: Reviewed medications with patient and discussed importance of medication adherence;        Counseled on importance of regular laboratory monitoring as prescribed;        Discussed plans with patient for ongoing care management follow up and provided patient with direct contact information for care management team;      Reviewed scheduled/upcoming provider appointments including: endocrinologist 02/12/23 at 230 pm;         Advised patient, providing education and rationale, to  check cbg per CGM  and record        Review of patient status, including review of consultants reports, relevant laboratory and other test results, and medications completed;       Reviewed fasting blood sugar goals of 80-130 and less than 180 1 1/2-2 hours after meals Reinforced safety precautions Reinforced carbohydrate modified diet and plate method  Symptom Management: Take medications as prescribed   Attend all scheduled provider appointments Call pharmacy for medication refills 3-7 days in advance of running out of medications Attend church or other social activities Perform all self care activities independently  Call provider office for new concerns or questions  check blood sugar at prescribed times: per [facility]  check feet daily for cuts, sores or redness enter blood sugar readings and medication or insulin into daily log take the blood sugar log to all doctor visits take the blood sugar meter to all doctor visits trim toenails straight across eat fish at least once per week fill half of plate with vegetables limit fast food meals to no more than 1 per week manage portion size prepare main meal at home 3 to 5 days each week read food labels for fat, fiber, carbohydrates and portion size keep feet up while sitting wash and dry feet carefully every day wear comfortable, cotton socks wear comfortable, well-fitting shoes Goal for blood sugars- fasting blood sugar goals of 80-130 and less than 180  1 1/2-2 hours after meals fall prevention strategies: change position slowly, use assistive device such as walker or cane (per provider recommendations) when walking, keep walkways clear, have good lighting in room. It is important to contact your provider if you have any falls, maintain muscle strength/tone by exercise per provider recommendations.  Follow Up Plan: Telephone follow up appointment with care management team member scheduled for:   06/11/23 at 9 am       CCM  (HYPERTENSION) EXPECTED OUTCOME: MONITOR, SELF-MANAGE AND REDUCE SYMPTOMS OF HYPERTENSION       Current Barriers:  Knowledge Deficits related to Hypertension management Care Coordination needs related to pharmacy/ medication management in a patient with Hypertension Chronic Disease Management support and education needs related to Hypertension, diet No Advanced Directives in place- information previously mailed Patient reports her 5 year old grandson lives with her, provides transportation as pt is currently not driving, pt states she is independent with all aspects of her care, has a shower seat to make bathing easier. Patient reports she has blood pressure cuff and checks on occasion and states "readings have been good" Patient reports she has started walking daily for 30 minutes and plans to continue  Planned Interventions: Evaluation of current treatment plan related to hypertension self management and patient's adherence to plan as established by provider;   Reviewed medications with patient and discussed importance of compliance;  Counseled on the importance of exercise goals with target of 150 minutes per week Advised patient, providing education and rationale, to monitor blood pressure daily and record, calling PCP for findings outside established parameters;  Discussed complications of poorly controlled blood pressure such as heart disease, stroke, circulatory complications, vision complications, kidney impairment, sexual dysfunction;  Reinforced low sodium diet and importance of reading labels  Symptom Management: Take medications as prescribed   Attend all scheduled provider appointments Call pharmacy for medication refills 3-7 days in advance of running out of medications Attend church or other social activities Perform all self care activities independently  Call provider office for new concerns or questions  check blood pressure weekly choose a place to take my blood pressure  (home, clinic or office, retail store) write blood pressure results in a log or diary learn about high blood pressure keep a blood pressure log take blood pressure log to all doctor appointments call doctor for signs and symptoms of high blood pressure develop an action plan for high blood pressure take medications for blood pressure exactly as prescribed report new symptoms to your doctor eat more whole grains, fruits and vegetables, lean meats and healthy fats Follow low sodium diet, limit/ avoid fast food Read food labels for sodium content Advanced directives packet mailed- please look over and complete Continue walking daily- keep up the good work!  Follow Up Plan: Telephone follow up appointment with care management team member scheduled for:  06/11/23 at 9 am            Plan: Telephone follow up appointment with care management team member scheduled for:  06/11/23 at 9 am  Irving Shows [facility], BSN [facility]/ Ambulatory Care Management [phone]

## 2023-03-25 NOTE — Patient Instructions (Signed)
Visit Information  Thank you for taking time to visit with me today. Please don't hesitate to contact me if I can be of assistance to you before our next scheduled telephone appointment.  Following are the goals we discussed today:   Goals Addressed             This Visit's Progress    CCM (DIABETES) EXPECTED OUTCOME: MONITOR, SELF- MANAGE AND REDUCE SYMPTOMS OF DIABETES       Current Barriers:  Knowledge Deficits related to Diabetes management Chronic Disease Management support and education needs related to Diabetes, diet and nutrition No Advanced Directives in place- pt requests information, documents mailed Patient will be working with pharmacist Patient reports CBG is monitored by CGM Freestyle Libre 3 with fasting ranges 178-200, random ranges 160-170, pt states she did not take her insulin for a month because she fractured her right arm in a fall on 06/25/22, her grandson did not want to do the injection, pt feels this is why her AIC went up to 9.8, pt states she is working on her diet, walking daily when she is not tired and is working towards goal of lowering AIC to below 7. Patient reports she saw a dietician in the past and wants to wait and see if she can get AIC down on her own, then if she cannot will revisit the idea of seeing a nutritionist Patient reports no new concerns  Planned Interventions: Reviewed medications with patient and discussed importance of medication adherence;        Counseled on importance of regular laboratory monitoring as prescribed;        Discussed plans with patient for ongoing care management follow up and provided patient with direct contact information for care management team;      Reviewed scheduled/upcoming provider appointments including: endocrinologist 02/12/23 at 230 pm;         Advised patient, providing education and rationale, to check cbg per CGM  and record        Review of patient status, including review of consultants reports,  relevant laboratory and other test results, and medications completed;       Reviewed fasting blood sugar goals of 80-130 and less than 180 1 1/2-2 hours after meals Reinforced safety precautions Reinforced carbohydrate modified diet and plate method  Symptom Management: Take medications as prescribed   Attend all scheduled provider appointments Call pharmacy for medication refills 3-7 days in advance of running out of medications Attend church or other social activities Perform all self care activities independently  Call provider office for new concerns or questions  check blood sugar at prescribed times: per [facility]  check feet daily for cuts, sores or redness enter blood sugar readings and medication or insulin into daily log take the blood sugar log to all doctor visits take the blood sugar meter to all doctor visits trim toenails straight across eat fish at least once per week fill half of plate with vegetables limit fast food meals to no more than 1 per week manage portion size prepare main meal at home 3 to 5 days each week read food labels for fat, fiber, carbohydrates and portion size keep feet up while sitting wash and dry feet carefully every day wear comfortable, cotton socks wear comfortable, well-fitting shoes Goal for blood sugars- fasting blood sugar goals of 80-130 and less than 180 1 1/2-2 hours after meals fall prevention strategies: change position slowly, use assistive device such as walker or cane (per  provider recommendations) when walking, keep walkways clear, have good lighting in room. It is important to contact your provider if you have any falls, maintain muscle strength/tone by exercise per provider recommendations.  Follow Up Plan: Telephone follow up appointment with care management team member scheduled for:   06/11/23 at 9 am       CCM (HYPERTENSION) EXPECTED OUTCOME: MONITOR, SELF-MANAGE AND REDUCE SYMPTOMS OF HYPERTENSION       Current  Barriers:  Knowledge Deficits related to Hypertension management Care Coordination needs related to pharmacy/ medication management in a patient with Hypertension Chronic Disease Management support and education needs related to Hypertension, diet No Advanced Directives in place- information previously mailed Patient reports her 6 year old grandson lives with her, provides transportation as pt is currently not driving, pt states she is independent with all aspects of her care, has a shower seat to make bathing easier. Patient reports she has blood pressure cuff and checks on occasion and states "readings have been good" Patient reports she has started walking daily for 30 minutes and plans to continue  Planned Interventions: Evaluation of current treatment plan related to hypertension self management and patient's adherence to plan as established by provider;   Reviewed medications with patient and discussed importance of compliance;  Counseled on the importance of exercise goals with target of 150 minutes per week Advised patient, providing education and rationale, to monitor blood pressure daily and record, calling PCP for findings outside established parameters;  Discussed complications of poorly controlled blood pressure such as heart disease, stroke, circulatory complications, vision complications, kidney impairment, sexual dysfunction;  Reinforced low sodium diet and importance of reading labels  Symptom Management: Take medications as prescribed   Attend all scheduled provider appointments Call pharmacy for medication refills 3-7 days in advance of running out of medications Attend church or other social activities Perform all self care activities independently  Call provider office for new concerns or questions  check blood pressure weekly choose a place to take my blood pressure (home, clinic or office, retail store) write blood pressure results in a log or diary learn about high  blood pressure keep a blood pressure log take blood pressure log to all doctor appointments call doctor for signs and symptoms of high blood pressure develop an action plan for high blood pressure take medications for blood pressure exactly as prescribed report new symptoms to your doctor eat more whole grains, fruits and vegetables, lean meats and healthy fats Follow low sodium diet, limit/ avoid fast food Read food labels for sodium content Advanced directives packet mailed- please look over and complete Continue walking daily- keep up the good work!  Follow Up Plan: Telephone follow up appointment with care management team member scheduled for:  06/11/23 at 9 am            Our next appointment is by telephone on 06/11/23 at 9 am  Please call the care guide team at [phone] if you need to cancel or reschedule your appointment.   If you are experiencing a Mental Health or Behavioral Health Crisis or need someone to talk to, please call the Suicide and Crisis Lifeline: 988 call the [place] National Suicide Prevention Lifeline: (787) 278-2350 or TTY: 209-860-8075 TTY (774) 366-4039) to talk to a trained counselor call 1-800-273-TALK (toll free, 24 hour hotline) go to [facility] [address], [place] [phone]) call the [facility] Crisis Line: (506)627-2919 call 911   Patient verbalizes understanding of instructions and care  plan provided today and agrees to view in MyChart. Active MyChart status and patient understanding of how to access instructions and care plan via MyChart confirmed with patient.     Telephone follow up appointment with care management team member scheduled for: 06/11/23 at 9 am  Irving Shows [facility], BSN [facility]/ Ambulatory Care Management [phone]

## 2023-05-08 ENCOUNTER — Other Ambulatory Visit: Payer: Self-pay | Admitting: Nurse Practitioner

## 2023-05-08 DIAGNOSIS — E1165 Type 2 diabetes mellitus with hyperglycemia: Secondary | ICD-10-CM

## 2023-05-08 MED ORDER — FREESTYLE LIBRE 3 PLUS SENSOR MISC: 3 refills | Status: DC

## 2023-05-15 ENCOUNTER — Ambulatory Visit: Payer: Medicare Other | Admitting: Nurse Practitioner

## 2023-05-17 ENCOUNTER — Other Ambulatory Visit: Payer: Self-pay | Admitting: Family Medicine

## 2023-06-06 ENCOUNTER — Encounter: Payer: Self-pay | Admitting: Family Medicine

## 2023-06-11 ENCOUNTER — Other Ambulatory Visit: Payer: Self-pay | Admitting: *Deleted

## 2023-06-11 NOTE — Patient Instructions (Signed)
Visit Information  Thank you for taking time to visit with me today. Please don't hesitate to contact me if I can be of assistance to you before our next scheduled telephone appointment.  Following are the goals we discussed today:   Goals Addressed             This Visit's Progress    CCM (DIABETES) EXPECTED OUTCOME: MONITOR, SELF- MANAGE AND REDUCE SYMPTOMS OF DIABETES       Current Barriers:  Knowledge Deficits related to Diabetes management Chronic Disease Management support and education needs related to Diabetes, diet and nutrition No Advanced Directives in place- pt requests information, documents mailed Has Freestyle Libre. Reported fasting glucose 230 this morning. States she has been dealing with URI and has been taking Nyquil and she has noticed this has caused an elevation in her blood sugars. RNCM advised to call PCP or opt for something sugar free, if necessary.  Planned Interventions: Reviewed medications with patient and discussed importance of medication adherence;        Counseled on importance of regular laboratory monitoring as prescribed;        Discussed plans with patient for ongoing care management follow up and provided patient with direct contact information for care management team;      Reviewed scheduled/upcoming provider appointments including: endocrinologist 08-14-2023;         Advised patient, providing education and rationale, to check cbg per CGM  and record        Review of patient status, including review of consultants reports, relevant laboratory and other test results, and medications completed;       Reinforced safety precautions Reinforced carbohydrate modified diet and plate method  Symptom Management: Take medications as prescribed   Attend all scheduled provider appointments Call pharmacy for medication refills 3-7 days in advance of running out of medications Attend church or other social activities Perform all self care activities  independently  Call provider office for new concerns or questions  check blood sugar at prescribed times: per [facility]  check feet daily for cuts, sores or redness enter blood sugar readings and medication or insulin into daily log take the blood sugar log to all doctor visits take the blood sugar meter to all doctor visits trim toenails straight across eat fish at least once per week fill half of plate with vegetables limit fast food meals to no more than 1 per week manage portion size prepare main meal at home 3 to 5 days each week read food labels for fat, fiber, carbohydrates and portion size keep feet up while sitting wash and dry feet carefully every day wear comfortable, cotton socks wear comfortable, well-fitting shoes Goal for blood sugars- fasting blood sugar goals of 80-130 and less than 180 1 1/2-2 hours after meals fall prevention strategies: change position slowly, use assistive device such as walker or cane (per provider recommendations) when walking, keep walkways clear, have good lighting in room. It is important to contact your provider if you have any falls, maintain muscle strength/tone by exercise per provider recommendations.  Follow Up Plan: Telephone follow up appointment with care management team member scheduled for:   07-17-2023 at 9:45 am       CCM (HYPERTENSION) EXPECTED OUTCOME: MONITOR, SELF-MANAGE AND REDUCE SYMPTOMS OF HYPERTENSION       fCurrent Barriers:  Knowledge Deficits related to Hypertension management Care Coordination needs related to pharmacy/ medication management in a patient with Hypertension Chronic Disease Management support and education  needs related to Hypertension, diet No Advanced Directives in place- information previously mailed Patient reports her 18 year old grandson lives with her, provides transportation as pt is currently not driving, pt states she is independent with all aspects of her care, has a shower seat to make  bathing easier. Patient reports she has blood pressure cuff and checks on occasion and states "readings have been good" Patient reports she has started walking daily for 30 minutes and plans to continue weather permitting  Planned Interventions: Evaluation of current treatment plan related to hypertension self management and patient's adherence to plan as established by provider;   Reviewed medications with patient and discussed importance of compliance;  Counseled on the importance of exercise goals with target of 150 minutes per week Advised patient, providing education and rationale, to monitor blood pressure daily and record, calling PCP for findings outside established parameters;  Discussed complications of poorly controlled blood pressure such as heart disease, stroke, circulatory complications, vision complications, kidney impairment, sexual dysfunction;  Reinforced low sodium diet and importance of reading labels  Symptom Management: Take medications as prescribed   Attend all scheduled provider appointments Call pharmacy for medication refills 3-7 days in advance of running out of medications Attend church or other social activities Perform all self care activities independently  Call provider office for new concerns or questions  check blood pressure weekly choose a place to take my blood pressure (home, clinic or office, retail store) write blood pressure results in a log or diary learn about high blood pressure keep a blood pressure log take blood pressure log to all doctor appointments call doctor for signs and symptoms of high blood pressure develop an action plan for high blood pressure take medications for blood pressure exactly as prescribed report new symptoms to your doctor eat more whole grains, fruits and vegetables, lean meats and healthy fats Follow low sodium diet, limit/ avoid fast food Read food labels for sodium content Advanced directives packet mailed-  please look over and complete Continue walking daily- keep up the good work!  Follow Up Plan: Telephone follow up appointment with care management team member scheduled for:  07-17-2023 at 9:45 am            Our next appointment is by telephone on 07-17-2023 at 9:45 am  Please call the care guide team at [phone] if you need to cancel or reschedule your appointment.   If you are experiencing a Mental Health or Behavioral Health Crisis or need someone to talk to, please call the Suicide and Crisis Lifeline: 988 call the [place] National Suicide Prevention Lifeline: 856-236-5779 or TTY: 805-186-1040 TTY 5642163729) to talk to a trained counselor call 1-800-273-TALK (toll free, 24 hour hotline)   Patient verbalizes understanding of instructions and care plan provided today and agrees to view in MyChart. Active MyChart status and patient understanding of how to access instructions and care plan via MyChart confirmed with patient.     Danise Edge, BSN RN RN Care Manager  [place]  Ambulatory Care Management  Direct Number: [phone]

## 2023-06-11 NOTE — Patient Outreach (Signed)
Care Management   Visit Note  06/11/2023 Name: Dawn Anthony MRN: [id number] DOB: 1950-08-02  Subjective: Dawn Anthony is a 72 y.o. year old female who is a primary care patient of Rakes, Doralee Albino, FNP. The Care Management team was consulted for assistance.      Engaged with patient spoke with patient by telephone.    Goals Addressed             This Visit's Progress    CCM (DIABETES) EXPECTED OUTCOME: MONITOR, SELF- MANAGE AND REDUCE SYMPTOMS OF DIABETES       Current Barriers:  Knowledge Deficits related to Diabetes management Chronic Disease Management support and education needs related to Diabetes, diet and nutrition No Advanced Directives in place- pt requests information, documents mailed Has Freestyle Libre. Reported fasting glucose 230 this morning. States she has been dealing with URI and has been taking Nyquil and she has noticed this has caused an elevation in her blood sugars. RNCM advised to call PCP or opt for something sugar free, if necessary.  Planned Interventions: Reviewed medications with patient and discussed importance of medication adherence;        Counseled on importance of regular laboratory monitoring as prescribed;        Discussed plans with patient for ongoing care management follow up and provided patient with direct contact information for care management team;      Reviewed scheduled/upcoming provider appointments including: endocrinologist 08-14-2023;         Advised patient, providing education and rationale, to check cbg per CGM  and record        Review of patient status, including review of consultants reports, relevant laboratory and other test results, and medications completed;       Reinforced safety precautions Reinforced carbohydrate modified diet and plate method  Symptom Management: Take medications as prescribed   Attend all scheduled provider appointments Call pharmacy for medication refills 3-7 days in advance of running  out of medications Attend church or other social activities Perform all self care activities independently  Call provider office for new concerns or questions  check blood sugar at prescribed times: per [facility]  check feet daily for cuts, sores or redness enter blood sugar readings and medication or insulin into daily log take the blood sugar log to all doctor visits take the blood sugar meter to all doctor visits trim toenails straight across eat fish at least once per week fill half of plate with vegetables limit fast food meals to no more than 1 per week manage portion size prepare main meal at home 3 to 5 days each week read food labels for fat, fiber, carbohydrates and portion size keep feet up while sitting wash and dry feet carefully every day wear comfortable, cotton socks wear comfortable, well-fitting shoes Goal for blood sugars- fasting blood sugar goals of 80-130 and less than 180 1 1/2-2 hours after meals fall prevention strategies: change position slowly, use assistive device such as walker or cane (per provider recommendations) when walking, keep walkways clear, have good lighting in room. It is important to contact your provider if you have any falls, maintain muscle strength/tone by exercise per provider recommendations.  Follow Up Plan: Telephone follow up appointment with care management team member scheduled for:   07-17-2023 at 9:45 am       CCM (HYPERTENSION) EXPECTED OUTCOME: MONITOR, SELF-MANAGE AND REDUCE SYMPTOMS OF HYPERTENSION       fCurrent Barriers:  Knowledge Deficits related to Hypertension management Care  Coordination needs related to pharmacy/ medication management in a patient with Hypertension Chronic Disease Management support and education needs related to Hypertension, diet No Advanced Directives in place- information previously mailed Patient reports her 28 year old grandson lives with her, provides transportation as pt is currently not  driving, pt states she is independent with all aspects of her care, has a shower seat to make bathing easier. Patient reports she has blood pressure cuff and checks on occasion and states "readings have been good" Patient reports she has started walking daily for 30 minutes and plans to continue weather permitting  Planned Interventions: Evaluation of current treatment plan related to hypertension self management and patient's adherence to plan as established by provider;   Reviewed medications with patient and discussed importance of compliance;  Counseled on the importance of exercise goals with target of 150 minutes per week Advised patient, providing education and rationale, to monitor blood pressure daily and record, calling PCP for findings outside established parameters;  Discussed complications of poorly controlled blood pressure such as heart disease, stroke, circulatory complications, vision complications, kidney impairment, sexual dysfunction;  Reinforced low sodium diet and importance of reading labels  Symptom Management: Take medications as prescribed   Attend all scheduled provider appointments Call pharmacy for medication refills 3-7 days in advance of running out of medications Attend church or other social activities Perform all self care activities independently  Call provider office for new concerns or questions  check blood pressure weekly choose a place to take my blood pressure (home, clinic or office, retail store) write blood pressure results in a log or diary learn about high blood pressure keep a blood pressure log take blood pressure log to all doctor appointments call doctor for signs and symptoms of high blood pressure develop an action plan for high blood pressure take medications for blood pressure exactly as prescribed report new symptoms to your doctor eat more whole grains, fruits and vegetables, lean meats and healthy fats Follow low sodium diet,  limit/ avoid fast food Read food labels for sodium content Advanced directives packet mailed- please look over and complete Continue walking daily- keep up the good work!  Follow Up Plan: Telephone follow up appointment with care management team member scheduled for:  07-17-2023 at 9:45 am            Consent to Services:  Patient was given information about care management services, agreed to services, and gave verbal consent to participate.   Plan: Telephone follow up appointment with care management team member scheduled for:07-17-2023 at 9:45 am  Danise Edge, BSN RN RN Care Manager  [facility]  Ambulatory Care Management  Direct Number: [phone]

## 2023-06-28 ENCOUNTER — Encounter (HOSPITAL_COMMUNITY): Payer: Self-pay | Admitting: *Deleted

## 2023-06-28 ENCOUNTER — Other Ambulatory Visit: Payer: Self-pay

## 2023-06-28 ENCOUNTER — Emergency Department (HOSPITAL_COMMUNITY)
Admission: EM | Admit: 2023-06-28 | Discharge: 2023-06-28 | Disposition: A | Discharge status: Home / Self Care | Payer: HMO | Attending: Emergency Medicine | Admitting: Emergency Medicine

## 2023-06-28 DIAGNOSIS — I2510 Atherosclerotic heart disease of native coronary artery without angina pectoris: Secondary | ICD-10-CM | POA: Diagnosis not present

## 2023-06-28 DIAGNOSIS — Z951 Presence of aortocoronary bypass graft: Secondary | ICD-10-CM | POA: Diagnosis not present

## 2023-06-28 DIAGNOSIS — I10 Essential (primary) hypertension: Secondary | ICD-10-CM | POA: Insufficient documentation

## 2023-06-28 DIAGNOSIS — R197 Diarrhea, unspecified: Secondary | ICD-10-CM | POA: Insufficient documentation

## 2023-06-28 DIAGNOSIS — E119 Type 2 diabetes mellitus without complications: Secondary | ICD-10-CM | POA: Insufficient documentation

## 2023-06-28 DIAGNOSIS — R112 Nausea with vomiting, unspecified: Secondary | ICD-10-CM | POA: Diagnosis not present

## 2023-06-28 DIAGNOSIS — Z794 Long term (current) use of insulin: Secondary | ICD-10-CM | POA: Diagnosis not present

## 2023-06-28 DIAGNOSIS — Z79899 Other long term (current) drug therapy: Secondary | ICD-10-CM | POA: Diagnosis not present

## 2023-06-28 DIAGNOSIS — Z91040 Latex allergy status: Secondary | ICD-10-CM | POA: Insufficient documentation

## 2023-06-28 DIAGNOSIS — R739 Hyperglycemia, unspecified: Secondary | ICD-10-CM | POA: Diagnosis not present

## 2023-06-28 DIAGNOSIS — Z7984 Long term (current) use of oral hypoglycemic drugs: Secondary | ICD-10-CM | POA: Insufficient documentation

## 2023-06-28 DIAGNOSIS — Z7982 Long term (current) use of aspirin: Secondary | ICD-10-CM | POA: Insufficient documentation

## 2023-06-28 DIAGNOSIS — E860 Dehydration: Secondary | ICD-10-CM

## 2023-06-28 DIAGNOSIS — E1165 Type 2 diabetes mellitus with hyperglycemia: Secondary | ICD-10-CM | POA: Diagnosis not present

## 2023-06-28 LAB — CBG MONITORING, ED
Glucose-Capillary: 404 mg/dL — ABNORMAL HIGH (ref 70–99)
Glucose-Capillary: 263 mg/dL — ABNORMAL HIGH (ref 70–99)

## 2023-06-28 LAB — COMPREHENSIVE METABOLIC PANEL
ALT: 13 U/L (ref 0–44)
AST: 18 U/L (ref 15–41)
Albumin: 3.6 g/dL (ref 3.5–5.0)
Alkaline Phosphatase: 52 U/L (ref 38–126)
Anion gap: 12 (ref 5–15)
BUN: 33 mg/dL — ABNORMAL HIGH (ref 8–23)
CO2: 23 mmol/L (ref 22–32)
Calcium: 8.4 mg/dL — ABNORMAL LOW (ref 8.9–10.3)
Chloride: 97 mmol/L — ABNORMAL LOW (ref 98–111)
Creatinine, Ser: 0.91 mg/dL (ref 0.44–1.00)
GFR, Estimated: >60 mL/min (ref >60)
Glucose, Bld: 405 mg/dL — ABNORMAL HIGH (ref 70–99)
Potassium: 4.3 mmol/L (ref 3.5–5.1)
Sodium: 132 mmol/L — ABNORMAL LOW (ref 135–145)
Total Bilirubin: 1 mg/dL (ref 0.0–1.2)
Total Protein: 6.6 g/dL (ref 6.5–8.1)

## 2023-06-28 LAB — CBC WITH DIFFERENTIAL/PLATELET
Abs Immature Granulocytes: 0.02 10*3/uL (ref 0.00–0.07)
Basophils Absolute: 0 10*3/uL (ref 0.0–0.1)
Basophils Relative: 0 %
Eosinophils Absolute: 0.1 10*3/uL (ref 0.0–0.5)
Eosinophils Relative: 1 %
HCT: 41.2 % (ref 36.0–46.0)
Hemoglobin: 13.8 g/dL (ref 12.0–15.0)
Immature Granulocytes: 0 %
Lymphocytes Relative: 8 %
Lymphs Abs: 0.5 10*3/uL — ABNORMAL LOW (ref 0.7–4.0)
MCH: 30.3 pg (ref 26.0–34.0)
MCHC: 33.5 g/dL (ref 30.0–36.0)
MCV: 90.4 fL (ref 80.0–100.0)
Monocytes Absolute: 0.3 10*3/uL (ref 0.1–1.0)
Monocytes Relative: 5 %
Neutro Abs: 5.8 10*3/uL (ref 1.7–7.7)
Neutrophils Relative %: 86 %
Platelets: 215 10*3/uL (ref 150–400)
RBC: 4.56 MIL/uL (ref 3.87–5.11)
RDW: 13.3 % (ref 11.5–15.5)
WBC: 6.7 10*3/uL (ref 4.0–10.5)
nRBC: 0 % (ref 0.0–0.2)

## 2023-06-28 LAB — URINALYSIS, ROUTINE W REFLEX MICROSCOPIC
Bacteria, UA: NONE SEEN
Bilirubin Urine: NEGATIVE
Glucose, UA: >=500 mg/dL — AB
Hgb urine dipstick: NEGATIVE
Ketones, ur: 5 mg/dL — AB
Leukocytes,Ua: NEGATIVE
Nitrite: NEGATIVE
Protein, ur: 30 mg/dL — AB
Specific Gravity, Urine: 1.039 — ABNORMAL HIGH (ref 1.005–1.030)
pH: 5 (ref 5.0–8.0)

## 2023-06-28 LAB — BETA-HYDROXYBUTYRIC ACID: Beta-Hydroxybutyric Acid: 0.19 mmol/L (ref 0.05–0.27)

## 2023-06-28 MED ORDER — SODIUM CHLORIDE 0.9 % IV BOLUS
1000.0000 mL | Freq: Once | INTRAVENOUS | Status: AC
Start: 2023-06-28 — End: 2023-06-28
  Administered 2023-06-28: 1000 mL via INTRAVENOUS

## 2023-06-28 MED ORDER — ONDANSETRON HCL 4 MG PO TABS: 4.0000 mg | ORAL_TABLET | Freq: Three times a day (TID) | ORAL | 0 refills | Status: DC | PRN

## 2023-06-28 MED ORDER — LOPERAMIDE HCL 2 MG PO CAPS: 2.0000 mg | ORAL_CAPSULE | Freq: Four times a day (QID) | ORAL | 0 refills | Status: DC | PRN

## 2023-06-28 MED ORDER — INSULIN ASPART 100 UNIT/ML IJ SOLN
10.0000 [IU] | Freq: Once | INTRAMUSCULAR | Status: AC
  Administered 2023-06-28: 10 [IU] via SUBCUTANEOUS
  Filled 2023-06-28: qty 1

## 2023-06-28 NOTE — ED Provider Notes (Signed)
 [place] EMERGENCY DEPARTMENT AT [facility] Provider Note  CSN: [id number] Arrival date & time: 06/28/23 1518  Chief Complaint(s) Hyperglycemia  HPI Dawn Anthony is a 73 y.o. female history of diabetes, coronary artery disease, hypertension presenting to the emergency department with nausea vomiting diarrhea.  She reports that she is having nausea vomiting and diarrhea since yesterday.  Reports yesterday she had some abdominal cramping but this is resolved currently.  No blood in her stool.  Her sister has some similar illness.  She was not able to keep anything fluids down due to the vomiting.  No hematemesis.  She reports that she feels better currently.  No fevers or chills, sore throat, runny nose.  Denies any recent antibiotic use.  No recent hospitalization.   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 Authorizing Provider  loperamide  (IMODIUM ) 2 MG capsule Take 1 capsule (2 mg total) by mouth 4 (four) times daily as needed for diarrhea or loose stools. 06/28/23  Yes Francesca Elsie CROME, MD  ondansetron  (ZOFRAN ) 4 MG tablet Take 1 tablet (4 mg total) by mouth every 8 (eight) hours as needed. 06/28/23   Yes Francesca Elsie CROME, MD  aspirin EC 81 MG tablet Take 81 mg by mouth daily. Swallow whole.    [provider]  carvedilol  (COREG ) 12.5 MG tablet TAKE 1 TABLET (12.5MG  TOTAL) BY MOUTH TWICE A DAY WITH MEALS 05/19/23   Severa Rock HERO, FNP  cholecalciferol  (VITAMIN D3) 25 MCG (1000 UT) tablet Take 50 Units by mouth daily.    [provider]  Continuous Glucose Sensor (FREESTYLE LIBRE 3 PLUS SENSOR) MISC Change sensor every 15 days. 05/08/23   Therisa Benton PARAS, NP  Continuous Glucose Sensor (FREESTYLE LIBRE 3 SENSOR) MISC PLACE 1 SENSOR ON THE SKIN EVERY 14 DAYS. USE TO CHECK GLUCOSE CONTINUOUSLY. DX: E11.65 05/08/23   Therisa Benton PARAS, NP  insulin  degludec (TRESIBA  FLEXTOUCH) 100 UNIT/ML FlexTouch Pen Inject 20 Units into the skin at bedtime. 02/12/23   Therisa Benton PARAS, NP  insulin  lispro (HUMALOG  KWIKPEN) 100 UNIT/ML KwikPen Inject 5-11 Units into the skin 3 (three) times daily. 02/12/23   Therisa Benton PARAS, NP  Insulin  Pen Needle (BD PEN NEEDLE NANO U/F) 32G X 4 MM MISC UAD QID Dx E11.69 11/06/22   Therisa Benton PARAS, NP  levothyroxine  (SYNTHROID ) 75 MCG tablet Take 1 tablet (75 mcg total) by mouth daily before breakfast. 02/12/23   Therisa Benton PARAS, NP  lisinopril  (ZESTRIL ) 5 MG tablet Take 1 tablet (5 mg total) by mouth daily. 08/28/22   Severa Rock HERO, FNP  metFORMIN  (GLUCOPHAGE ) 1000 MG tablet Take 1 tablet (1,000 mg total) by mouth 2 (two) times daily with a meal. 11/06/22   Therisa Benton PARAS, NP  rosuvastatin  (CRESTOR ) 20 MG tablet Take 20 mg by mouth daily.    [provider]                                                                                                                                    Past Surgical History Past Surgical History:  Procedure Laterality Date   CORONARY ARTERY BYPASS GRAFT  2022   EYE SURGERY     KNEE SURGERY Bilateral    OTHER SURGICAL HISTORY     shoulder surgery   THYROID  SURGERY     Family History Family History   Problem Relation Age of Onset   Diabetes Mother    Heart disease Mother    Heart failure Mother    Heart attack Father    Heart disease Father    COPD Father    Diabetes Father    Dementia Sister    Heart attack Sister    Hypertension Sister    Diabetes Brother    Heart attack Brother    Alcohol abuse Brother    Cervical cancer Maternal Grandmother    Diabetes Paternal Grandfather     Social History Social History   Tobacco Use   Smoking status: Never   Smokeless tobacco: Never  Vaping Use   Vaping status: Never Used  Substance Use Topics   Alcohol use: Never   Drug use: Never   Allergies Hydrochlorothiazide, Insulin  glargine-lixisenatide, Simvastatin, Penicillins, Amoxicillin, Cat hair extract, and Latex  Review of Systems Review of Systems  All other systems reviewed and are negative.   Physical Exam Vital Signs  I have reviewed the triage vital signs BP 137/73   Pulse 67   Temp 98.2 F (36.8 C) (Oral)   Resp 14   Ht 5' 1 (1.549 m)   Wt 74.8 kg   SpO2 99%   BMI 31.18 kg/m  Physical Exam Vitals and nursing note reviewed.  Constitutional:      General: She is not in acute distress.    Appearance: She is well-developed.  HENT:     Head: Normocephalic and atraumatic.     Mouth/Throat:     Mouth: Mucous membranes are dry.  Eyes:     Pupils: Pupils are equal, round, and reactive to light.  Cardiovascular:     Rate and Rhythm: Normal rate and regular rhythm.     Heart sounds: No murmur heard. Pulmonary:     Effort: Pulmonary effort is normal. No respiratory distress.     Breath sounds: Normal breath sounds.  Abdominal:     General: Abdomen is flat.     Palpations: Abdomen is soft.     Tenderness: There is no abdominal tenderness.  Musculoskeletal:        General: No tenderness.     Right lower leg: No edema.     Left lower leg: No edema.  Skin:    General: Skin is warm and  dry.  Neurological:     General: No focal deficit present.      Mental Status: She is alert. Mental status is at baseline.  Psychiatric:        Mood and Affect: Mood normal.        Behavior: Behavior normal.     ED Results and Treatments Labs (all labs ordered are listed, but only abnormal results are displayed) Labs Reviewed  CBC WITH DIFFERENTIAL/PLATELET - Abnormal; Notable for the following components:      Result Value   Lymphs Abs 0.5 (*)    All other components within normal limits  URINALYSIS, ROUTINE W REFLEX MICROSCOPIC - Abnormal; Notable for the following components:   Specific Gravity, Urine 1.039 (*)    Glucose, UA >=500 (*)    Ketones, ur 5 (*)    Protein, ur 30 (*)    All other components within normal limits  COMPREHENSIVE METABOLIC PANEL - Abnormal; Notable for the following components:   Sodium 132 (*)    Chloride 97 (*)    Glucose, Bld 405 (*)    BUN 33 (*)    Calcium  8.4 (*)    All other components within normal limits  CBG MONITORING, ED - Abnormal; Notable for the following components:   Glucose-Capillary 404 (*)    All other components within normal limits  CBG MONITORING, ED - Abnormal; Notable for the following components:   Glucose-Capillary 263 (*)    All other components within normal limits  BETA-HYDROXYBUTYRIC ACID                                                                                                                          Radiology No results found.  Pertinent labs & imaging results that were available during my care of the patient were reviewed by me and considered in my medical decision making (see MDM for details).  Medications Ordered in ED Medications  sodium chloride  0.9 % bolus 1,000 mL (0 mLs Intravenous Stopped 06/28/23 1938)  insulin  aspart (novoLOG ) injection 10 Units (10 Units Subcutaneous Given 06/28/23 1835)                                                                                                                                     Procedures Procedures  (including  critical care time)  Medical Decision Making / ED Course   MDM:  73 year old female  presenting to the emergency department with nausea vomiting and diarrhea.  Patient well-appearing, physical exam with no abdominal tenderness.  Appears very mildly dehydrated on physical exam.  Labs notable for mild hyperglycemia, no AKI.  Mild elevated BUN.  Suspect viral illness, will give fluids for dehydration as well as some small amount of insulin  for hyperglycemia.  No evidence of DKA.  Doubt invasive diarrhea without any abdominal pain, tenderness, hematochezia.  Doubt C. difficile without risk factors for this.  No abdominal tenderness to suggest other intra-abdominal process such as perforation, diverticulitis.  Doubt obstruction.  Will reassess.  If patient feeling better anticipate discharge home.  Clinical Course as of 06/28/23 2015  Sat Jun 28, 2023  2014 Patient feeling better.  Blood sugars improved.  She has tolerated p.o. in the emergency department. Will discharge patient to home. All questions answered. Patient comfortable with plan of discharge. Return precautions discussed with patient and specified on the after visit summary.  [WS]    Clinical Course User Index [WS] Francesca Elsie CROME, MD     Additional history obtained: -Additional history obtained from family    Lab Tests: -I ordered, reviewed, and interpreted labs.   The pertinent results include:   Labs Reviewed  CBC WITH DIFFERENTIAL/PLATELET - Abnormal; Notable for the following components:      Result Value   Lymphs Abs 0.5 (*)    All other components within normal limits  URINALYSIS, ROUTINE W REFLEX MICROSCOPIC - Abnormal; Notable for the following components:   Specific Gravity, Urine 1.039 (*)    Glucose, UA >=500 (*)    Ketones, ur 5 (*)    Protein, ur 30 (*)    All other components within normal limits  COMPREHENSIVE METABOLIC PANEL - Abnormal; Notable for the following components:   Sodium 132 (*)     Chloride 97 (*)    Glucose, Bld 405 (*)    BUN 33 (*)    Calcium  8.4 (*)    All other components within normal limits  CBG MONITORING, ED - Abnormal; Notable for the following components:   Glucose-Capillary 404 (*)    All other components within normal limits  CBG MONITORING, ED - Abnormal; Notable for the following components:   Glucose-Capillary 263 (*)    All other components within normal limits  BETA-HYDROXYBUTYRIC ACID    Notable for hyperglycemia    Medicines ordered and prescription drug management: Meds ordered this encounter  Medications   sodium chloride  0.9 % bolus 1,000 mL   insulin  aspart (novoLOG ) injection 10 Units   ondansetron  (ZOFRAN ) 4 MG tablet    Sig: Take 1 tablet (4 mg total) by mouth every 8 (eight) hours as needed.    Dispense:  12 tablet    Refill:  0   loperamide  (IMODIUM ) 2 MG capsule    Sig: Take 1 capsule (2 mg total) by mouth 4 (four) times daily as needed for diarrhea or loose stools.    Dispense:  12 capsule    Refill:  0    -I have reviewed the patients home medicines and have made adjustments as needed   Social Determinants of Health:  Diagnosis or treatment significantly limited by social determinants of health: obesity   Reevaluation: After the interventions noted above, I reevaluated the patient and found that their symptoms have improved  Co morbidities that complicate the patient evaluation  Past Medical History:  Diagnosis Date   Diabetes mellitus without complication (HCC)    Gastritis  Hyperlipidemia    Hypertension    Thyroid  disease       Dispostion: Disposition decision including need for hospitalization was considered, and patient discharged from emergency department.    Final Clinical Impression(s) / ED Diagnoses Final diagnoses:  Dehydration  Nausea vomiting and diarrhea     This chart was dictated using voice recognition software.  Despite best efforts to proofread,  errors can occur which can change  the documentation meaning.    Francesca Elsie CROME, MD 06/28/23 2015

## 2023-06-28 NOTE — Discharge Instructions (Addendum)
 We evaluated you for your nausea, vomiting and diarrhea.  Your laboratory test showed that you had mild dehydration.  Your blood sugar improved with IV fluids.  Your testing is otherwise reassuring.  I suspect that you have a viral illness.  Please try to stay hydrated.  I prescribed you nausea medicine which she can take every 8 hours as needed.  I have also prescribed you Imodium , which you can take up to 4 times daily as needed for diarrhea.  Please follow-up closely with your primary doctor.  If you develop any new or worsening symptoms such as severe pain, blood in your stool, lightheadedness or dizziness, fainting, chest pain, shortness of breath, or any other new symptoms, please return to the emergency department.

## 2023-06-28 NOTE — ED Provider Triage Note (Signed)
 Emergency Medicine Provider Triage Evaluation Note  Dawn Anthony , a 73 y.o. female  was evaluated in triage.  Pt complains of hyperglycemia, N/V/D.  Review of Systems  Positive:  Negative:   Physical Exam  BP 109/74 (BP Location: Right Arm)   Pulse 80   Temp 98.7 Anthony (37.1 C) (Oral)   Resp 16   Ht 5' 1 (1.549 m)   Wt 74.8 kg   SpO2 98%   BMI 31.18 kg/m  Gen:   Awake, no distress   Resp:  Normal effort  MSK:   Moves extremities without difficulty  Other:    Medical Decision Making  Medically screening exam initiated at 3:59 PM.  Appropriate orders placed.  Dawn Anthony was informed that the remainder of the evaluation will be completed by another provider, this initial triage assessment does not replace that evaluation, and the importance of remaining in the ED until their evaluation is complete.  Vomiting and diarrhea since yesterday. Also concerned for hyperglycemia. Patient endorses compliance with insulin  most of the time. Denies fever, hematemesis, hematochezia, abdominal pain, dysuria, hematuria.    Dawn Anthony, [place] 06/28/23 1601

## 2023-06-28 NOTE — ED Notes (Signed)
 CBG in triage was 404

## 2023-06-28 NOTE — ED Triage Notes (Signed)
 Pt with emesis and diarrhea since yesterday, CBG 310 at home.  Noted blood sugars more elevated over the past month.

## 2023-07-03 ENCOUNTER — Ambulatory Visit: Payer: HMO | Admitting: Family Medicine

## 2023-07-03 ENCOUNTER — Encounter: Payer: Self-pay | Admitting: Family Medicine

## 2023-07-03 ENCOUNTER — Other Ambulatory Visit (INDEPENDENT_AMBULATORY_CARE_PROVIDER_SITE_OTHER): Payer: HMO

## 2023-07-03 VITALS — BP 148/72 | HR 68 | Temp 97.0°F | Ht 61.0 in | Wt 151.0 lb

## 2023-07-03 DIAGNOSIS — Z23 Encounter for immunization: Secondary | ICD-10-CM | POA: Diagnosis not present

## 2023-07-03 DIAGNOSIS — I152 Hypertension secondary to endocrine disorders: Secondary | ICD-10-CM | POA: Diagnosis not present

## 2023-07-03 DIAGNOSIS — Z79899 Other long term (current) drug therapy: Secondary | ICD-10-CM | POA: Insufficient documentation

## 2023-07-03 DIAGNOSIS — Z13820 Encounter for screening for osteoporosis: Secondary | ICD-10-CM

## 2023-07-03 DIAGNOSIS — Z1159 Encounter for screening for other viral diseases: Secondary | ICD-10-CM | POA: Diagnosis not present

## 2023-07-03 DIAGNOSIS — Z0000 Encounter for general adult medical examination without abnormal findings: Secondary | ICD-10-CM

## 2023-07-03 DIAGNOSIS — Z1231 Encounter for screening mammogram for malignant neoplasm of breast: Secondary | ICD-10-CM

## 2023-07-03 DIAGNOSIS — E785 Hyperlipidemia, unspecified: Secondary | ICD-10-CM | POA: Diagnosis not present

## 2023-07-03 DIAGNOSIS — H918X3 Other specified hearing loss, bilateral: Secondary | ICD-10-CM

## 2023-07-03 DIAGNOSIS — Z0001 Encounter for general adult medical examination with abnormal findings: Secondary | ICD-10-CM

## 2023-07-03 DIAGNOSIS — Z780 Asymptomatic menopausal state: Secondary | ICD-10-CM | POA: Diagnosis not present

## 2023-07-03 DIAGNOSIS — E559 Vitamin D deficiency, unspecified: Secondary | ICD-10-CM

## 2023-07-03 DIAGNOSIS — E890 Postprocedural hypothyroidism: Secondary | ICD-10-CM

## 2023-07-03 DIAGNOSIS — Z794 Long term (current) use of insulin: Secondary | ICD-10-CM

## 2023-07-03 DIAGNOSIS — E1159 Type 2 diabetes mellitus with other circulatory complications: Secondary | ICD-10-CM

## 2023-07-03 DIAGNOSIS — E1169 Type 2 diabetes mellitus with other specified complication: Secondary | ICD-10-CM | POA: Diagnosis not present

## 2023-07-03 DIAGNOSIS — Z951 Presence of aortocoronary bypass graft: Secondary | ICD-10-CM | POA: Diagnosis not present

## 2023-07-03 DIAGNOSIS — M8589 Other specified disorders of bone density and structure, multiple sites: Secondary | ICD-10-CM | POA: Diagnosis not present

## 2023-07-03 LAB — BAYER DCA HB A1C WAIVED: HB A1C (BAYER DCA - WAIVED): 10.7 % — ABNORMAL HIGH (ref 4.8–5.6)

## 2023-07-03 MED ORDER — CARVEDILOL 12.5 MG PO TABS: 12.5000 mg | ORAL_TABLET | Freq: Two times a day (BID) | ORAL | 1 refills | Status: DC

## 2023-07-03 NOTE — Progress Notes (Signed)
 Complete physical exam  Patient: Dawn Anthony   DOB: 06-25-1950   73 y.o. Female  MRN: [id number]  Subjective:    Chief Complaint  Patient presents with   Annual Exam    Dawn Anthony is a 73 y.o. female who presents today for a complete physical exam. She reports consuming a general diet.  Patient has been walking on a treadmill.  She generally feels well. She reports sleeping well. She does have additional problems to discuss today.    History of Present Illness   The patient, with a history of diabetes, hypertension, and cardiovascular disease, presents with recent episodes of elevated blood pressure and blood sugar levels. She attributes these episodes to the use of Nyquil for a recent head cold and a subsequent stomach virus, which required an emergency room visit for dehydration. Since recovering from the illness, the patient reports persistent hyperglycemia, with blood sugars in the upper 200s to 300s, and associated symptoms of increased thirst, hunger, and urination. She is currently using a Libre 3 device, which frequently indicates elevated blood sugar levels.  The patient also reports recent visual changes, specifically intermittent blurry vision, which she has not yet discussed with her eye doctor. She also notes some hearing difficulties, often asking people to repeat themselves and experiencing occasional ringing in her ears.  She reports occasional lower extremity swelling and recent flea bites on her legs from a cat exposure.  Regarding her thyroid  condition, the patient reports no recent changes in her levothyroxine  medication. She also mentions an increase in urinary urgency, but no other bladder problems. She has recently started using a treadmill for exercise and reports no joint issues following a previous knee replacement.       Most recent fall risk assessment:    07/03/2023    8:36 AM  Fall Risk   Falls in the past year? 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   Hyperlipidemia associated with type 2 diabetes mellitus (HCC) 07/12/2022   Coronary artery disease involving native coronary artery of native heart without angina pectoris 07/11/2021   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   Diabetes mellitus (HCC) 09/23/2018   Hypertension associated with diabetes (HCC) 09/23/2018   Multiple thyroid  nodules 08/31/2018   Status post total knee replacement, left 03/12/2017   Postoperative hypothyroidism 12/02/2008   Past Medical History:  Diagnosis Date   Diabetes mellitus without complication (HCC)    Gastritis    Hyperlipidemia    Hypertension    Thyroid  disease    Past Surgical History:  Procedure Laterality Date   CORONARY ARTERY BYPASS GRAFT  2022   EYE SURGERY     KNEE SURGERY Bilateral    OTHER SURGICAL HISTORY     shoulder surgery   THYROID  SURGERY     Social History   Tobacco Use   Smoking status: Never   Smokeless tobacco: Never  Vaping Use   Vaping status: Never  Used  Substance Use Topics   Alcohol use: Never   Drug use: Never   Social History   Socioeconomic History   Marital status: Widowed    Spouse name: Not on file   Number of children: 1   Years of education: Not on file   Highest education level: 12th grade  Occupational History   Not on file  Tobacco Use   Smoking status: Never   Smokeless tobacco: Never  Vaping Use   Vaping status: Never Used  Substance and Sexual Activity   Alcohol use: Never   Drug use: Never   Sexual activity: Not Currently  Other  Topics Concern   Not on file  Social History Narrative   Grandson lives with her.    Social Drivers of Corporate Investment Banker Strain: Low Risk  (09/30/2022)   Overall Financial Resource Strain (CARDIA)    Difficulty of Paying Living Expenses: Not hard at all  Food Insecurity: No Food Insecurity (09/30/2022)   Hunger Vital Sign    Worried About Running Out of Food in the Last Year: Never true    Ran Out of Food in the Last Year: Never true  Recent Concern: Food Insecurity - Food Insecurity Present (09/23/2022)   Hunger Vital Sign    Worried About Running Out of Food in the Last Year: Sometimes true    Ran Out of Food in the Last Year: Sometimes true  Transportation Needs: No Transportation Needs (09/30/2022)   PRAPARE - Administrator, Civil Service (Medical): No    Lack of Transportation (Non-Medical): No  Physical Activity: Insufficiently Active (09/30/2022)   Exercise Vital Sign    Days of Exercise per Week: 3 days    Minutes of Exercise per Session: 30 min  Stress: No Stress Concern Present (09/30/2022)   [employer] of Occupational Health - Occupational Stress Questionnaire    Feeling of Stress : Not at all  Social Connections: Socially Isolated (09/30/2022)   Social Connection and Isolation Panel [NHANES]    Frequency of Communication with Friends and Family: More than three times a week    Frequency of Social Gatherings with Friends and Family: More than three times a week    Attends Religious Services: Never    Database Administrator or Organizations: No    Attends Banker Meetings: Never    Marital Status: Widowed  Intimate Partner Violence: Not At Risk (09/30/2022)   Humiliation, Afraid, Rape, and Kick questionnaire    Fear of Current or Ex-Partner: No    Emotionally Abused: No    Physically Abused: No    Sexually Abused: No   Family Status  Relation Name Status   Mother  Deceased   Father  Deceased   Sister  Deceased   Sister  Alive    Brother 3 Alive   Daughter  Alive   MGM  Deceased   MGF  Deceased   PGM  Deceased   PGF  Deceased  No partnership data on file   Family History  Problem Relation Age of Onset   Diabetes Mother    Heart disease Mother    Heart failure Mother    Heart attack Father    Heart disease Father    COPD Father    Diabetes Father    Dementia Sister    Heart attack Sister    Hypertension Sister    Diabetes Brother    Heart attack Brother    Alcohol abuse  Brother    Cervical cancer Maternal Grandmother    Diabetes Paternal Grandfather    Allergies  Allergen Reactions   Hydrochlorothiazide     Other Reaction(s): Unknown-Unspecified  Stopped due to pancreatitis   Insulin  Glargine-Lixisenatide Nausea And Vomiting   Simvastatin     Other Reaction(s): Unknown-Unspecified  Stopped due to pancreatitis  Stopped due to pancreatitis   Penicillins Hives   Amoxicillin Itching and Rash   Cat Hair Extract Itching and Rash   Latex Other (See Comments) and Rash      Patient Care Team: Severa Rock HERO, FNP as PCP - General (Family Medicine) Bertrum Rosina HERO, RN as VBCI Care Management (General Practice)   Outpatient Medications Prior to Visit  Medication Sig   aspirin EC 81 MG tablet Take 81 mg by mouth daily. Swallow whole.   cholecalciferol  (VITAMIN D3) 25 MCG (1000 UT) tablet Take 50 Units by mouth daily.   Continuous Glucose Sensor (FREESTYLE LIBRE 3 PLUS SENSOR) MISC Change sensor every 15 days.   Continuous Glucose Sensor (FREESTYLE LIBRE 3 SENSOR) MISC PLACE 1 SENSOR ON THE SKIN EVERY 14 DAYS. USE TO CHECK GLUCOSE CONTINUOUSLY. DX: E11.65   insulin  degludec (TRESIBA  FLEXTOUCH) 100 UNIT/ML FlexTouch Pen Inject 20 Units into the skin at bedtime.   insulin  lispro (HUMALOG  KWIKPEN) 100 UNIT/ML KwikPen Inject 5-11 Units into the skin 3 (three) times daily.   Insulin  Pen Needle (BD PEN NEEDLE NANO U/F) 32G X 4 MM MISC UAD QID Dx E11.69   levothyroxine  (SYNTHROID ) 75 MCG tablet Take 1  tablet (75 mcg total) by mouth daily before breakfast.   lisinopril  (ZESTRIL ) 5 MG tablet Take 1 tablet (5 mg total) by mouth daily.   loperamide  (IMODIUM ) 2 MG capsule Take 1 capsule (2 mg total) by mouth 4 (four) times daily as needed for diarrhea or loose stools.   metFORMIN  (GLUCOPHAGE ) 1000 MG tablet Take 1 tablet (1,000 mg total) by mouth 2 (two) times daily with a meal.   ondansetron  (ZOFRAN ) 4 MG tablet Take 1 tablet (4 mg total) by mouth every 8 (eight) hours as needed.   rosuvastatin  (CRESTOR ) 20 MG tablet Take 20 mg by mouth daily.   [DISCONTINUED] carvedilol  (COREG ) 12.5 MG tablet TAKE 1 TABLET (12.5MG  TOTAL) BY MOUTH TWICE A DAY WITH MEALS   No facility-administered medications prior to visit.    Review of Systems  Constitutional:  Negative for chills, diaphoresis, fever, malaise/fatigue and weight loss.  Respiratory:  Negative for cough, hemoptysis, sputum production, shortness of breath and wheezing.   Cardiovascular:  Positive for leg swelling. Negative for chest pain, palpitations, orthopnea, claudication and PND.  Genitourinary:  Positive for urgency. Negative for dysuria, flank pain, frequency and hematuria.  Endo/Heme/Allergies:  Negative for environmental allergies and polydipsia. Does not bruise/bleed easily.  All other systems reviewed and are negative.       Objective:     BP (!) 148/72   Pulse 68   Temp (!) 97 F (36.1 C)   Ht 5' 1 (1.549 m)   Wt 151 lb (68.5 kg)   SpO2 96%   BMI 28.53 kg/m  BP Readings from Last 3 Encounters:  07/03/23 (!) 148/72  06/28/23 123/76  02/12/23 138/82   Wt Readings from Last 3 Encounters:  07/03/23 151 lb (68.5 kg)  06/28/23 165 lb (74.8 kg)  02/12/23 150 lb 12.8 oz (68.4 kg)   SpO2 Readings from Last 3 Encounters:  07/03/23 96%  06/28/23 95%  12/31/22 95%  Physical Exam Vitals and nursing note reviewed.  Constitutional:      General: She is not in acute distress.    Appearance: Normal appearance. She  is well-developed and well-groomed. She is not ill-appearing, toxic-appearing or diaphoretic.  HENT:     Head: Normocephalic and atraumatic.     Jaw: There is normal jaw occlusion.     Right Ear: Hearing, tympanic membrane, ear canal and external ear normal.     Left Ear: Hearing, tympanic membrane, ear canal and external ear normal.     Nose: Nose normal.     Mouth/Throat:     Lips: Pink.     Mouth: Mucous membranes are moist.     Pharynx: Oropharynx is clear. Uvula midline.  Eyes:     General: Lids are normal.     Extraocular Movements: Extraocular movements intact.     Conjunctiva/sclera: Conjunctivae normal.     Pupils: Pupils are equal, round, and reactive to light.  Neck:     Thyroid : No thyroid  mass, thyromegaly or thyroid  tenderness.     Vascular: No carotid bruit or JVD.     Trachea: Trachea and phonation normal.  Cardiovascular:     Rate and Rhythm: Normal rate and regular rhythm.     Chest Wall: PMI is not displaced.     Pulses: Normal pulses.     Heart sounds: Normal heart sounds. No murmur heard.    No friction rub. No gallop.  Pulmonary:     Effort: Pulmonary effort is normal. No respiratory distress.     Breath sounds: Normal breath sounds. No wheezing.  Chest:     Comments: Well healed sternotomy Abdominal:     General: Bowel sounds are normal. There is no distension or abdominal bruit.     Palpations: Abdomen is soft. There is no hepatomegaly or splenomegaly.     Tenderness: There is no abdominal tenderness. There is no right CVA tenderness or left CVA tenderness.     Hernia: No hernia is present.  Musculoskeletal:        General: Normal range of motion.     Cervical back: Normal range of motion and neck supple.     Right lower leg: No edema.     Left lower leg: No edema.  Lymphadenopathy:     Cervical: No cervical adenopathy.  Skin:    General: Skin is warm and dry.     Capillary Refill: Capillary refill takes less than 2 seconds.     Coloration: Skin  is not cyanotic, jaundiced or pale.     Findings: No rash.  Neurological:     General: No focal deficit present.     Mental Status: She is alert and oriented to person, place, and time.     Sensory: Sensation is intact.     Motor: Motor function is intact.     Coordination: Coordination is intact.     Gait: Gait is intact.     Deep Tendon Reflexes: Reflexes are normal and symmetric.  Psychiatric:        Attention and Perception: Attention and perception normal.        Mood and Affect: Mood and affect normal.        Speech: Speech normal.        Behavior: Behavior normal. Behavior is cooperative.        Thought Content: Thought content normal.        Cognition and Memory: Cognition and memory normal.  Judgment: Judgment normal.      Last CBC Lab Results  Component Value Date   WBC 6.7 06/28/2023   HGB 13.8 06/28/2023   HCT 41.2 06/28/2023   MCV 90.4 06/28/2023   MCH 30.3 06/28/2023   RDW 13.3 06/28/2023   PLT 215 06/28/2023   Last metabolic panel Lab Results  Component Value Date   GLUCOSE 405 (H) 06/28/2023   NA 132 (L) 06/28/2023   K 4.3 06/28/2023   CL 97 (L) 06/28/2023   CO2 23 06/28/2023   BUN 33 (H) 06/28/2023   CREATININE 0.91 06/28/2023   GFRNONAA >60 06/28/2023   CALCIUM  8.4 (L) 06/28/2023   PROT 6.6 06/28/2023   ALBUMIN 3.6 06/28/2023   LABGLOB 1.9 02/05/2023   AGRATIO 2.1 08/28/2022   BILITOT 1.0 06/28/2023   ALKPHOS 52 06/28/2023   AST 18 06/28/2023   ALT 13 06/28/2023   ANIONGAP 12 06/28/2023   Last lipids Lab Results  Component Value Date   CHOL 146 12/31/2022   HDL 80 12/31/2022   LDLCALC 52 12/31/2022   TRIG 73 12/31/2022   CHOLHDL 1.8 12/31/2022   Last hemoglobin A1c Lab Results  Component Value Date   HGBA1C 9.7 (A) 02/12/2023   Last thyroid  functions Lab Results  Component Value Date   TSH 1.080 02/05/2023   T4TOTAL 10.7 08/28/2022   Last vitamin D  Lab Results  Component Value Date   VD25OH 56.0 08/28/2022     Assessment & Plan:    Routine Health Maintenance and Physical Exam  Immunization History  Administered Date(s) Administered   Fluad Trivalent(High Dose 65+) 07/03/2023   Influenza Split 04/24/2013, 03/07/2017, 03/27/2018   Influenza, High Dose Seasonal PF 03/08/2021, 03/08/2021   Influenza, Quadrivalent, Recombinant, Inj, Pf 03/08/2021   Influenza, Seasonal, Injecte, Preservative Fre 04/27/2015, 02/28/2017   Influenza,inj,Quad PF,6+ Mos 04/02/2020   Influenza,trivalent, recombinat, inj, PF 04/24/2013, 03/07/2017, 03/27/2018   Influenza-Unspecified 04/27/2015, 02/28/2017, 03/08/2021   Moderna Sars-Covid-2 Vaccination 11/01/2019, 12/09/2019   PFIZER(Purple Top)SARS-COV-2 Vaccination 06/13/2020   Pneumococcal Conjugate-13 04/07/2018   Pneumococcal Polysaccharide-23 11/21/2007, 10/25/2019   Tdap 12/10/2006, 04/24/2017   Zoster, Live 03/22/2013    Health Maintenance  Topic Date Due   DEXA SCAN  Never done   COVID-19 Vaccine (4 - 2024-25 season) 07/19/2023 (Originally 02/23/2023)   Hepatitis C Screening  09/25/2023 (Originally 07/21/1968)   Zoster Vaccines- Shingrix (1 of 2) 10/01/2023 (Originally 07[birth year]22/1971)   HEMOGLOBIN A1C  08/15/2023   Diabetic kidney evaluation - Urine ACR  08/28/2023   OPHTHALMOLOGY EXAM  08/28/2023   Medicare Annual Wellness (AWV)  09/30/2023   FOOT EXAM  10/02/2023   MAMMOGRAM  03/25/2024   Diabetic kidney evaluation - eGFR measurement  06/27/2024   Colonoscopy  02/06/2027   DTaP/Tdap/Td (3 - Td or Tdap) 04/25/2027   Pneumonia Vaccine 43+ Years old  Completed   INFLUENZA VACCINE  Completed   HPV VACCINES  Aged Out    Discussed health benefits of physical activity, and encouraged her to engage in regular exercise appropriate for her age and condition.  Problem List Items Addressed This Visit       Cardiovascular and Mediastinum   Hypertension associated with diabetes (HCC)   Relevant Medications   carvedilol  (COREG ) 12.5 MG tablet   Other  Relevant Orders   Lipid panel   CBC with Differential/Platelet   CMP14+EGFR   Thyroid  Panel With TSH     Endocrine   Diabetes mellitus (HCC)   Relevant Orders   Lipid panel   CBC  with Differential/Platelet   CMP14+EGFR   Bayer DCA Hb A1c Waived   Vitamin B12   Hyperlipidemia associated with type 2 diabetes mellitus (HCC)   Relevant Medications   carvedilol  (COREG ) 12.5 MG tablet   Other Relevant Orders   Lipid panel   CMP14+EGFR   Postoperative hypothyroidism   Relevant Medications   carvedilol  (COREG ) 12.5 MG tablet     Other   S/P CABG x 3   Vitamin D  deficiency   Relevant Orders   CMP14+EGFR   VITAMIN D  25 Hydroxy (Vit-D Deficiency, Fractures)   High risk medication use   Relevant Orders   CBC with Differential/Platelet   CMP14+EGFR   VITAMIN D  25 Hydroxy (Vit-D Deficiency, Fractures)   Vitamin B12   Other Visit Diagnoses       Annual physical exam    -  Primary   Relevant Orders   MM Digital Screening   Hepatitis C antibody   DG [facility] DEXA     Encounter for screening mammogram for malignant neoplasm of breast       Relevant Orders   MM Digital Screening     Need for hepatitis C screening test       Relevant Orders   Hepatitis C antibody     Screening for osteoporosis       Relevant Orders   DG [facility] DEXA     Other specified hearing loss of both ears       Relevant Orders   Ambulatory referral to Audiology     Immunization due       Relevant Orders   Flu Vaccine Trivalent High Dose (Fluad) (Completed)     Assessment and Plan    Diabetes Mellitus Elevated blood sugars (200s-300s) post head cold and stomach virus. Symptoms include increased thirst, hunger, urination, and blurry vision likely due to hyperglycemia. Uses Libre 3 for continuous glucose monitoring and is on insulin  therapy. Recently started treadmill exercise. Endocrinology follow-up scheduled for August 14, 2023. - Continue insulin  therapy - Monitor blood sugars with Herlene 3 - Follow up  with endocrinologist on August 14, 2023 - Schedule follow-up with ophthalmologist for blurry vision - Continue treadmill exercise  Hypertension Elevated blood pressure following Nyquil use. Typically low when dehydrated. No recent chest pain or cardiovascular symptoms. - Monitor blood pressure - Continue current antihypertensive medications  Hypothyroidism Stable on levothyroxine  with no recent dosage changes. Previously adjusted dosage to exclude Sundays. - Check thyroid  function tests with labs today  Hearing Loss Difficulty hearing and tinnitus. No wax buildup or fluid behind eardrums noted. Sensation of fluid in ears at times. - Refer to audiologist for hearing evaluation  General Health Maintenance Due for mammogram and DEXA scan. Flu shot recommended. - Order mammogram - Order DEXA scan - Administer flu shot today  Follow-up - Schedule follow-up appointment in six months.       Return in about 6 months (around 12/31/2023), or if symptoms worsen or fail to improve, for chronic follow up .     Rosaline Bruns, FNP-C [facility] [address] [place], [place] [address] [phone]

## 2023-07-04 LAB — LIPID PANEL
Chol/HDL Ratio: 2.1 {ratio} (ref 0.0–4.4)
Cholesterol, Total: 107 mg/dL (ref 100–199)
HDL: 52 mg/dL (ref >39)
LDL Chol Calc (NIH): 37 mg/dL (ref 0–99)
Triglycerides: 98 mg/dL (ref 0–149)
VLDL Cholesterol Cal: 18 mg/dL (ref 5–40)

## 2023-07-04 LAB — CMP14+EGFR
ALT: 13 [IU]/L (ref 0–32)
AST: 18 [IU]/L (ref 0–40)
Albumin: 4.1 g/dL (ref 3.9–4.9)
Alkaline Phosphatase: 59 [IU]/L (ref 44–121)
BUN/Creatinine Ratio: 29 — ABNORMAL HIGH (ref 12–28)
BUN: 22 mg/dL (ref 8–27)
Bilirubin Total: 0.3 mg/dL (ref 0.0–1.2)
CO2: 22 mmol/L (ref 20–29)
Calcium: 8.9 mg/dL (ref 8.7–10.3)
Chloride: 103 mmol/L (ref 96–106)
Creatinine, Ser: 0.76 mg/dL (ref 0.57–1.00)
Globulin, Total: 1.9 g/dL (ref 1.5–4.5)
Glucose: 210 mg/dL — ABNORMAL HIGH (ref 70–99)
Potassium: 4.3 mmol/L (ref 3.5–5.2)
Sodium: 139 mmol/L (ref 134–144)
Total Protein: 6 g/dL (ref 6.0–8.5)
eGFR: 84 mL/min/{1.73_m2} (ref >59)

## 2023-07-04 LAB — CBC WITH DIFFERENTIAL/PLATELET
Basophils Absolute: 0 10*3/uL (ref 0.0–0.2)
Basos: 1 %
EOS (ABSOLUTE): 0.2 10*3/uL (ref 0.0–0.4)
Eos: 4 %
Hematocrit: 41.2 % (ref 34.0–46.6)
Hemoglobin: 13.3 g/dL (ref 11.1–15.9)
Immature Grans (Abs): 0 10*3/uL (ref 0.0–0.1)
Immature Granulocytes: 1 %
Lymphocytes Absolute: 2.2 10*3/uL (ref 0.7–3.1)
Lymphs: 34 %
MCH: 29.5 pg (ref 26.6–33.0)
MCHC: 32.3 g/dL (ref 31.5–35.7)
MCV: 91 fL (ref 79–97)
Monocytes Absolute: 0.4 10*3/uL (ref 0.1–0.9)
Monocytes: 6 %
Neutrophils Absolute: 3.5 10*3/uL (ref 1.4–7.0)
Neutrophils: 54 %
Platelets: 264 10*3/uL (ref 150–450)
RBC: 4.51 x10E6/uL (ref 3.77–5.28)
RDW: 12.7 % (ref 11.7–15.4)
WBC: 6.4 10*3/uL (ref 3.4–10.8)

## 2023-07-04 LAB — VITAMIN D 25 HYDROXY (VIT D DEFICIENCY, FRACTURES): Vit D, 25-Hydroxy: 38 ng/mL (ref 30.0–100.0)

## 2023-07-04 LAB — HEPATITIS C ANTIBODY: Hep C Virus Ab: NONREACTIVE

## 2023-07-04 LAB — THYROID PANEL WITH TSH
Free Thyroxine Index: 2.7 (ref 1.2–4.9)
T3 Uptake Ratio: 26 % (ref 24–39)
T4, Total: 10.4 ug/dL (ref 4.5–12.0)
TSH: 2.01 u[IU]/mL (ref 0.450–4.500)

## 2023-07-04 LAB — VITAMIN B12: Vitamin B-12: 401 pg/mL (ref 232–1245)

## 2023-07-16 ENCOUNTER — Encounter (HOSPITAL_COMMUNITY): Payer: Self-pay

## 2023-07-16 ENCOUNTER — Ambulatory Visit (HOSPITAL_COMMUNITY)
Admission: RE | Admit: 2023-07-16 | Discharge: 2023-07-16 | Disposition: A | Discharge status: Home / Self Care | Payer: HMO | Source: Ambulatory Visit | Attending: Family Medicine | Admitting: Family Medicine

## 2023-07-16 DIAGNOSIS — Z0000 Encounter for general adult medical examination without abnormal findings: Secondary | ICD-10-CM | POA: Insufficient documentation

## 2023-07-16 DIAGNOSIS — Z1231 Encounter for screening mammogram for malignant neoplasm of breast: Secondary | ICD-10-CM | POA: Diagnosis not present

## 2023-07-17 ENCOUNTER — Other Ambulatory Visit: Payer: Self-pay | Admitting: *Deleted

## 2023-07-17 NOTE — Patient Instructions (Signed)
Visit Information  Thank you for taking time to visit with me today. Please don't hesitate to contact me if I can be of assistance to you before our next scheduled telephone appointment.  Following are the goals we discussed today:   Goals Addressed             This Visit's Progress    RNCM Care Management Expected Outcomes: Monitor, Self-Manage and Reduce Symptoms of: DM, HTN       Current Barriers:  Knowledge Deficits related to plan of care for management of HTN and DMII  Chronic Disease Management support and education needs related to HTN and DMII   RNCM Clinical Goal(s):  Patient will verbalize basic understanding of  HTN and DMII disease process and self health management plan as evidenced by verbal explanation, recognizing symptoms and lifestyle modifications  attend all scheduled medical appointments: with primary care provider and specialist as evidenced by keeping all scheduled appointments demonstrate Improved and Ongoing adherence to prescribed treatment plan for HTN and DMII as evidenced by consistent medication compliance, symptom monitoring, continued lifestyle modifications continue to work with RN Care Manager to address care management and care coordination needs related to  HTN and DMII as evidenced by adherence to CM Team Scheduled appointments through collaboration with RN Care manager, provider, and care team.   Interventions: Evaluation of current treatment plan related to  self management and patient's adherence to plan as established by provider   Diabetes Interventions:  (Status:  Goal on track:  Yes.) Long Term Goal Assessed patient's understanding of A1c goal: <7% Provided education to patient about basic DM disease process. Checks blood sugar and reports fasting glucose this morning of 203, highest 24 lowest 68. RNCM reviewed with patient fasting glucose <130 and post prandial <180. Patient verbalized full understanding. Patient states she she is working  with a Museum/gallery exhibitions officer through her health plan to help tailor a meal plan. Reviewed medications with patient and discussed importance of medication adherence. Reports compliance with all medications Counseled on importance of regular laboratory monitoring as prescribed Discussed plans with patient for ongoing care management follow up and provided patient with direct contact information for care management team Provided patient with written educational materials related to hypo and hyperglycemia and importance of correct treatment Reviewed scheduled/upcoming provider appointments including: 08-14-2023 Advised patient, providing education and rationale, to check cbg as recommended and record, calling provider for findings outside established parameters Review of patient status, including review of consultants reports, relevant laboratory and other test results, and medications completed Screening for signs and symptoms of depression related to chronic disease state  Assessed social determinant of health barriers Lab Results  Component Value Date   HGBA1C 10.7 (H) 07/03/2023    Hypertension Interventions:  (Status:  Goal on track:  Yes.) Long Term Goal Last practice recorded BP readings:  BP Readings from Last 3 Encounters:  07/03/23 (!) 148/72  06/28/23 123/76  02/12/23 138/82   Most recent eGFR/CrCl:  Lab Results  Component Value Date   EGFR 84 07/03/2023    No components found for: "CRCL"  Evaluation of current treatment plan related to hypertension self management and patient's adherence to plan as established by provider. Checks blood pressure at home and states she ranges 110s/70-80s. RNCM reviewed with patient SBP<140 DBP<90. Denies any chest pain, swelling. RNCM reviewed with patient about changes patient slowly to decrease any dizziness Provided education to patient re: stroke prevention, s/s of heart attack and stroke Reviewed medications with patient and  discussed importance of  compliance Counseled on adverse effects of illicit drug and excessive alcohol use in patients with high blood pressure  Counseled on the importance of exercise goals with target of 150 minutes per week Discussed plans with patient for ongoing care management follow up and provided patient with direct contact information for care management team Advised patient, providing education and rationale, to monitor blood pressure daily and record, calling PCP for findings outside established parameters Reviewed scheduled/upcoming provider appointments including: 08-14-2023 Advised patient to discuss any acute changes with provider Provided education on prescribed diet ADA Discussed complications of poorly controlled blood pressure such as heart disease, stroke, circulatory complications, vision complications, kidney impairment, sexual dysfunction Screening for signs and symptoms of depression related to chronic disease state  Assessed social determinant of health barriers  Patient Goals/Self-Care Activities: Take all medications as prescribed Attend all scheduled provider appointments Call pharmacy for medication refills 3-7 days in advance of running out of medications Attend church or other social activities Perform all self care activities independently  Perform IADL's (shopping, preparing meals, housekeeping, managing finances) independently Call provider office for new concerns or questions  schedule appointment with eye doctor check blood sugar at prescribed times: before meals and at bedtime check feet daily for cuts, sores or redness check blood pressure daily write blood pressure results in a log or diary  Follow Up Plan:  Telephone follow up appointment with care management team member scheduled for:  08-19-2023 at 9:45 am           Our next appointment is by telephone on 08-19-2023 at 9:45 am  Please call the care guide team at [phone] if you need to cancel or reschedule your  appointment.   If you are experiencing a Mental Health or Behavioral Health Crisis or need someone to talk to, please call the Suicide and Crisis Lifeline: 988 call the [place] National Suicide Prevention Lifeline: 6267559356 or TTY: 703-270-2614 TTY 3326970704) to talk to a trained counselor call 1-800-273-TALK (toll free, 24 hour hotline)   Patient verbalizes understanding of instructions and care plan provided today and agrees to view in MyChart. Active MyChart status and patient understanding of how to access instructions and care plan via MyChart confirmed with patient.     Telephone follow up appointment with care management team member scheduled for:  Rosalene Billings, BSN RN [facility], [facility] RN Care Manager Direct Dial: [phone]  Fax: [phone]

## 2023-07-17 NOTE — Patient Outreach (Signed)
Care Management   Visit Note  07/17/2023 Name: Dawn Anthony MRN: [id number] DOB: 02-13-1951  Subjective: Dawn Anthony is a 73 y.o. year old female who is a primary care patient of Rakes, Doralee Albino, FNP. The Care Management team was consulted for assistance.      Engaged with patient spoke with patient by telephone.    Goals Addressed             This Visit's Progress    RNCM Care Management Expected Outcomes: Monitor, Self-Manage and Reduce Symptoms of: DM, HTN       Current Barriers:  Knowledge Deficits related to plan of care for management of HTN and DMII  Chronic Disease Management support and education needs related to HTN and DMII   RNCM Clinical Goal(s):  Patient will verbalize basic understanding of  HTN and DMII disease process and self health management plan as evidenced by verbal explanation, recognizing symptoms and lifestyle modifications  attend all scheduled medical appointments: with primary care provider and specialist as evidenced by keeping all scheduled appointments demonstrate Improved and Ongoing adherence to prescribed treatment plan for HTN and DMII as evidenced by consistent medication compliance, symptom monitoring, continued lifestyle modifications continue to work with RN Care Manager to address care management and care coordination needs related to  HTN and DMII as evidenced by adherence to CM Team Scheduled appointments through collaboration with RN Care manager, provider, and care team.   Interventions: Evaluation of current treatment plan related to  self management and patient's adherence to plan as established by provider   Diabetes Interventions:  (Status:  Goal on track:  Yes.) Long Term Goal Assessed patient's understanding of A1c goal: <7% Provided education to patient about basic DM disease process. Checks blood sugar and reports fasting glucose this morning of 203, highest 24 lowest 68. RNCM reviewed with patient fasting glucose <130  and post prandial <180. Patient verbalized full understanding. Patient states she she is working with a Museum/gallery exhibitions officer through her health plan to help tailor a meal plan. Reviewed medications with patient and discussed importance of medication adherence. Reports compliance with all medications Counseled on importance of regular laboratory monitoring as prescribed Discussed plans with patient for ongoing care management follow up and provided patient with direct contact information for care management team Provided patient with written educational materials related to hypo and hyperglycemia and importance of correct treatment Reviewed scheduled/upcoming provider appointments including: 08-14-2023 Advised patient, providing education and rationale, to check cbg as recommended and record, calling provider for findings outside established parameters Review of patient status, including review of consultants reports, relevant laboratory and other test results, and medications completed Screening for signs and symptoms of depression related to chronic disease state  Assessed social determinant of health barriers Lab Results  Component Value Date   HGBA1C 10.7 (H) 07/03/2023    Hypertension Interventions:  (Status:  Goal on track:  Yes.) Long Term Goal Last practice recorded BP readings:  BP Readings from Last 3 Encounters:  07/03/23 (!) 148/72  06/28/23 123/76  02/12/23 138/82   Most recent eGFR/CrCl:  Lab Results  Component Value Date   EGFR 84 07/03/2023    No components found for: "CRCL"  Evaluation of current treatment plan related to hypertension self management and patient's adherence to plan as established by provider. Checks blood pressure at home and states she ranges 110s/70-80s. RNCM reviewed with patient SBP<140 DBP<90. Denies any chest pain, swelling. RNCM reviewed with patient about changes patient slowly to decrease any dizziness  Provided education to patient re: stroke prevention, s/s  of heart attack and stroke Reviewed medications with patient and discussed importance of compliance Counseled on adverse effects of illicit drug and excessive alcohol use in patients with high blood pressure  Counseled on the importance of exercise goals with target of 150 minutes per week Discussed plans with patient for ongoing care management follow up and provided patient with direct contact information for care management team Advised patient, providing education and rationale, to monitor blood pressure daily and record, calling PCP for findings outside established parameters Reviewed scheduled/upcoming provider appointments including: 08-14-2023 Advised patient to discuss any acute changes with provider Provided education on prescribed diet ADA Discussed complications of poorly controlled blood pressure such as heart disease, stroke, circulatory complications, vision complications, kidney impairment, sexual dysfunction Screening for signs and symptoms of depression related to chronic disease state  Assessed social determinant of health barriers  Patient Goals/Self-Care Activities: Take all medications as prescribed Attend all scheduled provider appointments Call pharmacy for medication refills 3-7 days in advance of running out of medications Attend church or other social activities Perform all self care activities independently  Perform IADL's (shopping, preparing meals, housekeeping, managing finances) independently Call provider office for new concerns or questions  schedule appointment with eye doctor check blood sugar at prescribed times: before meals and at bedtime check feet daily for cuts, sores or redness check blood pressure daily write blood pressure results in a log or diary  Follow Up Plan:  Telephone follow up appointment with care management team member scheduled for:  08-19-2023 at 9:45 am              Consent to Services:  Patient was given information about  care management services, agreed to services, and gave verbal consent to participate.   Plan: Telephone follow up appointment with care management team member scheduled for:08-19-2023 at 9:45 am  Rosalene Billings, BSN RN [facility], [facility] RN Care Manager Direct Dial: [phone]  Fax: [phone]

## 2023-07-24 ENCOUNTER — Other Ambulatory Visit: Payer: Self-pay | Admitting: Family Medicine

## 2023-07-24 DIAGNOSIS — E1159 Type 2 diabetes mellitus with other circulatory complications: Secondary | ICD-10-CM

## 2023-07-24 DIAGNOSIS — E1169 Type 2 diabetes mellitus with other specified complication: Secondary | ICD-10-CM

## 2023-08-05 ENCOUNTER — Inpatient Hospital Stay
Admission: RE | Admit: 2023-08-05 | Discharge: 2023-08-05 | Disposition: A | Discharge status: Home / Self Care | Payer: Self-pay | Source: Ambulatory Visit | Attending: Family Medicine | Admitting: Family Medicine

## 2023-08-05 ENCOUNTER — Inpatient Hospital Stay
Admission: RE | Admit: 2023-08-05 | Discharge: 2023-08-05 | Disposition: A | Discharge status: Home / Self Care | Payer: Self-pay | Source: Ambulatory Visit | Attending: Family Medicine

## 2023-08-05 ENCOUNTER — Other Ambulatory Visit: Payer: Self-pay | Admitting: Family Medicine

## 2023-08-05 DIAGNOSIS — Z0000 Encounter for general adult medical examination without abnormal findings: Secondary | ICD-10-CM

## 2023-08-05 DIAGNOSIS — Z1231 Encounter for screening mammogram for malignant neoplasm of breast: Secondary | ICD-10-CM

## 2023-08-06 ENCOUNTER — Encounter: Payer: Self-pay | Admitting: Family Medicine

## 2023-08-14 ENCOUNTER — Ambulatory Visit: Payer: Medicare Other | Admitting: Nurse Practitioner

## 2023-08-19 ENCOUNTER — Telehealth: Payer: Self-pay | Admitting: *Deleted

## 2023-08-19 ENCOUNTER — Other Ambulatory Visit: Payer: Self-pay | Admitting: *Deleted

## 2023-08-19 NOTE — Patient Outreach (Signed)
  Care Management   Follow Up Note   08/19/2023 Name: Dawn Anthony MRN: [id number] DOB: 1950/12/02   Referred by: Sonny Masters, FNP Reason for referral : Care Management (RNCM: ATTEMPT Follow Up For Chronic Disease Management & Care Coordination Needs)   An unsuccessful telephone outreach was attempted today. The patient was referred to the case management team for assistance with care management and care coordination.   Follow Up Plan: The care management team will reach out to the patient again over the next 30 days.   Larey Brick, BSN RN [facility], [facility] RN Care Manager Direct Dial: [phone]  Fax: [phone]

## 2023-08-19 NOTE — Patient Outreach (Signed)
 Care Management   Visit Note  08/19/2023 Name: Dawn Anthony MRN: [id number] DOB: 10-23-1950  Subjective: Dawn Anthony is a 73 y.o. year old female who is a primary care patient of Rakes, Doralee Albino, FNP. The Care Management team was consulted for assistance.      Engaged with patient spoke with patient by telephone.    Goals Addressed             This Visit's Progress    RNCM Care Management Expected Outcomes: Monitor, Self-Manage and Reduce Symptoms of: DM, HTN       Current Barriers:  Knowledge Deficits related to plan of care for management of HTN and DMII  Chronic Disease Management support and education needs related to HTN and DMII   RNCM Clinical Goal(s):  Patient will verbalize basic understanding of  HTN and DMII disease process and self health management plan as evidenced by verbal explanation, recognizing symptoms and lifestyle modifications  attend all scheduled medical appointments: with primary care provider and specialist as evidenced by keeping all scheduled appointments demonstrate Improved and Ongoing adherence to prescribed treatment plan for HTN and DMII as evidenced by consistent medication compliance, symptom monitoring, continued lifestyle modifications continue to work with RN Care Manager to address care management and care coordination needs related to  HTN and DMII as evidenced by adherence to CM Team Scheduled appointments through collaboration with RN Care manager, provider, and care team.   Interventions: Evaluation of current treatment plan related to  self management and patient's adherence to plan as established by provider   Diabetes Interventions:  (Status:  Goal on track:  Yes.) Long Term Goal Assessed patient's understanding of A1c goal: <7% Provided education to patient about basic DM disease process. Checks blood sugar and reports fasting glucose this morning of 230, highest 297 lowest 101. RNCM reviewed with patient fasting glucose <130  and post prandial <180. Patient verbalized full understanding. Patient reports that she works with her dietitian weekly.  Reviewed medications with patient and discussed importance of medication adherence. Reports compliance with all medications Counseled on importance of regular laboratory monitoring as prescribed Discussed plans with patient for ongoing care management follow up and provided patient with direct contact information for care management team Provided patient with written educational materials related to hypo and hyperglycemia and importance of correct treatment Reviewed scheduled/upcoming provider appointments including: 09-16-2023 Endocrinology, 11-12-2023 with Cards Advised patient, providing education and rationale, to check cbg as recommended and record, calling provider for findings outside established parameters Review of patient status, including review of consultants reports, relevant laboratory and other test results, and medications completed Screening for signs and symptoms of depression related to chronic disease state  Assessed social determinant of health barriers Will need to schedule eye exam for 2025 Lab Results  Component Value Date   HGBA1C 10.7 (H) 07/03/2023      Hypertension Interventions:  (Status:  Goal on track:  Yes.) Long Term Goal Last practice recorded BP readings:  BP Readings from Last 3 Encounters:  07/03/23 (!) 148/72  06/28/23 123/76  02/12/23 138/82   Most recent eGFR/CrCl:  Lab Results  Component Value Date   EGFR 84 07/03/2023    No components found for: "CRCL"  Evaluation of current treatment plan related to hypertension self management and patient's adherence to plan as established by provider. Checks blood pressure at home and states she ranges 130-140s/70s. RNCM reviewed with patient SBP<140 DBP<90. Denies any chest pain, swelling. RNCM reviewed with patient about changes patient  slowly to decrease any dizziness Provided education  to patient re: stroke prevention, s/s of heart attack and stroke. Education and support provided Reviewed medications with patient and discussed importance of compliance. Reports compliance Counseled on adverse effects of illicit drug and excessive alcohol use in patients with high blood pressure  Counseled on the importance of exercise goals with target of 150 minutes per week Discussed plans with patient for ongoing care management follow up and provided patient with direct contact information for care management team Advised patient, providing education and rationale, to monitor blood pressure daily and record, calling PCP for findings outside established parameters Reviewed scheduled/upcoming provider appointments including: 09-16-2023 Advised patient to discuss any acute changes with provider Provided education on prescribed diet ADA Discussed complications of poorly controlled blood pressure such as heart disease, stroke, circulatory complications, vision complications, kidney impairment, sexual dysfunction Screening for signs and symptoms of depression related to chronic disease state  Assessed social determinant of health barriers  Patient Goals/Self-Care Activities: Take all medications as prescribed Attend all scheduled provider appointments Call pharmacy for medication refills 3-7 days in advance of running out of medications Attend church or other social activities Perform all self care activities independently  Perform IADL's (shopping, preparing meals, housekeeping, managing finances) independently Call provider office for new concerns or questions  schedule appointment with eye doctor check blood sugar at prescribed times: before meals and at bedtime check feet daily for cuts, sores or redness check blood pressure daily write blood pressure results in a log or diary  Follow Up Plan:  Telephone follow up appointment with care management team member scheduled for:  09-18-2023 at  9:45 am           Consent to Services:  Patient was given information about care management services, agreed to services, and gave verbal consent to participate.   Plan: Telephone follow up appointment with care management team member scheduled for:09-18-2023 at 9:45 am  Larey Brick, BSN RN [facility] RN Care Manager Direct Dial: [phone]  Fax: [phone]

## 2023-08-19 NOTE — Patient Instructions (Signed)
 Visit Information  Thank you for taking time to visit with me today. Please don't hesitate to contact me if I can be of assistance to you before our next scheduled telephone appointment.  Following are the goals we discussed today:   Goals Addressed             This Visit's Progress    RNCM Care Management Expected Outcomes: Monitor, Self-Manage and Reduce Symptoms of: DM, HTN       Current Barriers:  Knowledge Deficits related to plan of care for management of HTN and DMII  Chronic Disease Management support and education needs related to HTN and DMII   RNCM Clinical Goal(s):  Patient will verbalize basic understanding of  HTN and DMII disease process and self health management plan as evidenced by verbal explanation, recognizing symptoms and lifestyle modifications  attend all scheduled medical appointments: with primary care provider and specialist as evidenced by keeping all scheduled appointments demonstrate Improved and Ongoing adherence to prescribed treatment plan for HTN and DMII as evidenced by consistent medication compliance, symptom monitoring, continued lifestyle modifications continue to work with RN Care Manager to address care management and care coordination needs related to  HTN and DMII as evidenced by adherence to CM Team Scheduled appointments through collaboration with RN Care manager, provider, and care team.   Interventions: Evaluation of current treatment plan related to  self management and patient's adherence to plan as established by provider   Diabetes Interventions:  (Status:  Goal on track:  Yes.) Long Term Goal Assessed patient's understanding of A1c goal: <7% Provided education to patient about basic DM disease process. Checks blood sugar and reports fasting glucose this morning of 230, highest 297 lowest 101. RNCM reviewed with patient fasting glucose <130 and post prandial <180. Patient verbalized full understanding. Patient reports that she works with  her dietitian weekly.  Reviewed medications with patient and discussed importance of medication adherence. Reports compliance with all medications Counseled on importance of regular laboratory monitoring as prescribed Discussed plans with patient for ongoing care management follow up and provided patient with direct contact information for care management team Provided patient with written educational materials related to hypo and hyperglycemia and importance of correct treatment Reviewed scheduled/upcoming provider appointments including: 09-16-2023 Endocrinology, 11-12-2023 with Cards Advised patient, providing education and rationale, to check cbg as recommended and record, calling provider for findings outside established parameters Review of patient status, including review of consultants reports, relevant laboratory and other test results, and medications completed Screening for signs and symptoms of depression related to chronic disease state  Assessed social determinant of health barriers Will need to schedule eye exam for 2025 Lab Results  Component Value Date   HGBA1C 10.7 (H) 07/03/2023      Hypertension Interventions:  (Status:  Goal on track:  Yes.) Long Term Goal Last practice recorded BP readings:  BP Readings from Last 3 Encounters:  07/03/23 (!) 148/72  06/28/23 123/76  02/12/23 138/82   Most recent eGFR/CrCl:  Lab Results  Component Value Date   EGFR 84 07/03/2023    No components found for: "CRCL"  Evaluation of current treatment plan related to hypertension self management and patient's adherence to plan as established by provider. Checks blood pressure at home and states she ranges 130-140s/70s. RNCM reviewed with patient SBP<140 DBP<90. Denies any chest pain, swelling. RNCM reviewed with patient about changes patient slowly to decrease any dizziness Provided education to patient re: stroke prevention, s/s of heart attack and stroke.  Education and support  provided Reviewed medications with patient and discussed importance of compliance. Reports compliance Counseled on adverse effects of illicit drug and excessive alcohol use in patients with high blood pressure  Counseled on the importance of exercise goals with target of 150 minutes per week Discussed plans with patient for ongoing care management follow up and provided patient with direct contact information for care management team Advised patient, providing education and rationale, to monitor blood pressure daily and record, calling PCP for findings outside established parameters Reviewed scheduled/upcoming provider appointments including: 09-16-2023 Advised patient to discuss any acute changes with provider Provided education on prescribed diet ADA Discussed complications of poorly controlled blood pressure such as heart disease, stroke, circulatory complications, vision complications, kidney impairment, sexual dysfunction Screening for signs and symptoms of depression related to chronic disease state  Assessed social determinant of health barriers  Patient Goals/Self-Care Activities: Take all medications as prescribed Attend all scheduled provider appointments Call pharmacy for medication refills 3-7 days in advance of running out of medications Attend church or other social activities Perform all self care activities independently  Perform IADL's (shopping, preparing meals, housekeeping, managing finances) independently Call provider office for new concerns or questions  schedule appointment with eye doctor check blood sugar at prescribed times: before meals and at bedtime check feet daily for cuts, sores or redness check blood pressure daily write blood pressure results in a log or diary  Follow Up Plan:  Telephone follow up appointment with care management team member scheduled for:  09-18-2023 at 9:45 am           Our next appointment is by telephone on 09-18-2023 at 9:45  am  Please call the care guide team at [phone] if you need to cancel or reschedule your appointment.   If you are experiencing a Mental Health or Behavioral Health Crisis or need someone to talk to, please call the Suicide and Crisis Lifeline: 988 call the [place] National Suicide Prevention Lifeline: 812 577 3983 or TTY: 720-780-0128 TTY (413)735-2964) to talk to a trained counselor call 1-800-273-TALK (toll free, 24 hour hotline) call the [facility]: (719)455-1724   Patient verbalizes understanding of instructions and care plan provided today and agrees to view in MyChart. Active MyChart status and patient understanding of how to access instructions and care plan via MyChart confirmed with patient.     Telephone follow up appointment with care management team member scheduled for:09-18-2023 at 9:45 am  Larey Brick, BSN RN [facility], [facility] RN Care Manager Direct Dial: [phone]  Fax: [phone]    Diabetes Mellitus and Nutrition, Adult When you have diabetes, or diabetes mellitus, it is very important to have healthy eating habits because your blood sugar (glucose) levels are greatly affected by what you eat and drink. Eating healthy foods in the right amounts, at about the same times every day, can help you: Manage your blood glucose. Lower your risk of heart disease. Improve your blood pressure. Reach or maintain a healthy weight. What can affect my meal plan? Every person with diabetes is different, and each person has different needs for a meal plan. Your health care provider may recommend that you work with a dietitian to make a meal plan that is best for you. Your meal plan may vary depending on factors such as: The calories you need. The medicines you take. Your weight. Your blood glucose, blood pressure, and cholesterol levels. Your activity level. Other health conditions you have, such  as heart or kidney  disease. How do carbohydrates affect me? Carbohydrates, also called carbs, affect your blood glucose level more than any other type of food. Eating carbs raises the amount of glucose in your blood. It is important to know how many carbs you can safely have in each meal. This is different for every person. Your dietitian can help you calculate how many carbs you should have at each meal and for each snack. How does alcohol affect me? Alcohol can cause a decrease in blood glucose (hypoglycemia), especially if you use insulin or take certain diabetes medicines by mouth. Hypoglycemia can be a life-threatening condition. Symptoms of hypoglycemia, such as sleepiness, dizziness, and confusion, are similar to symptoms of having too much alcohol. Do not drink alcohol if: Your health care provider tells you not to drink. You are pregnant, may be pregnant, or are planning to become pregnant. If you drink alcohol: Limit how much you have to: 0-1 drink a day for women. 0-2 drinks a day for men. Know how much alcohol is in your drink. In the [place] one drink equals one 12 oz bottle of beer (355 mL), one 5 oz glass of wine (148 mL), or one 1 oz glass of hard liquor (44 mL). Keep yourself hydrated with water, diet soda, or unsweetened iced tea. Keep in mind that regular soda, juice, and other mixers may contain a lot of sugar and must be counted as carbs. What are tips for following this plan?  Reading food labels Start by checking the serving size on the Nutrition Facts label of packaged foods and drinks. The number of calories and the amount of carbs, fats, and other nutrients listed on the label are based on one serving of the item. Many items contain more than one serving per package. Check the total grams (g) of carbs in one serving. Check the number of grams of saturated fats and trans fats in one serving. Choose foods that have a low amount or none of these fats. Check the number of milligrams (mg) of  salt (sodium) in one serving. Most people should limit total sodium intake to less than 2,300 mg per day. Always check the nutrition information of foods labeled as "low-fat" or "nonfat." These foods may be higher in added sugar or refined carbs and should be avoided. Talk to your dietitian to identify your daily goals for nutrients listed on the label. Shopping Avoid buying canned, pre-made, or processed foods. These foods tend to be high in fat, sodium, and added sugar. Shop around the outside edge of the grocery store. This is where you will most often find fresh fruits and vegetables, bulk grains, fresh meats, and fresh dairy products. Cooking Use low-heat cooking methods, such as baking, instead of high-heat cooking methods, such as deep frying. Cook using healthy oils, such as olive, canola, or sunflower oil. Avoid cooking with butter, cream, or high-fat meats. Meal planning Eat meals and snacks regularly, preferably at the same times every day. Avoid going long periods of time without eating. Eat foods that are high in fiber, such as fresh fruits, vegetables, beans, and whole grains. Eat 4-6 oz (112-168 g) of lean protein each day, such as lean meat, chicken, fish, eggs, or tofu. One ounce (oz) (28 g) of lean protein is equal to: 1 oz (28 g) of meat, chicken, or fish. 1 egg.  cup (62 g) of tofu. Eat some foods each day that contain healthy fats, such as avocado, nuts, seeds, and fish.  What foods should I eat? Fruits Berries. Apples. Oranges. Peaches. Apricots. Plums. Grapes. Mangoes. Papayas. Pomegranates. Kiwi. Cherries. Vegetables Leafy greens, including lettuce, spinach, kale, chard, collard greens, mustard greens, and cabbage. Beets. Cauliflower. Broccoli. Carrots. Green beans. Tomatoes. Peppers. Onions. Cucumbers. Brussels sprouts. Grains Whole grains, such as whole-wheat or whole-grain bread, crackers, tortillas, cereal, and pasta. Unsweetened oatmeal. Quinoa. Brown or wild  rice. Meats and other proteins Seafood. Poultry without skin. Lean cuts of poultry and beef. Tofu. Nuts. Seeds. Dairy Low-fat or fat-free dairy products such as milk, yogurt, and cheese. The items listed above may not be a complete list of foods and beverages you can eat and drink. Contact a dietitian for more information. What foods should I avoid? Fruits Fruits canned with syrup. Vegetables Canned vegetables. Frozen vegetables with butter or cream sauce. Grains Refined white flour and flour products such as bread, pasta, snack foods, and cereals. Avoid all processed foods. Meats and other proteins Fatty cuts of meat. Poultry with skin. Breaded or fried meats. Processed meat. Avoid saturated fats. Dairy Full-fat yogurt, cheese, or milk. Beverages Sweetened drinks, such as soda or iced tea. The items listed above may not be a complete list of foods and beverages you should avoid. Contact a dietitian for more information. Questions to ask a health care provider Do I need to meet with a certified diabetes care and education specialist? Do I need to meet with a dietitian? What number can I call if I have questions? When are the best times to check my blood glucose? Where to find more information: American Diabetes Association: diabetes.org Academy of Nutrition and Dietetics: eatright[employer] of Diabetes and Digestive and Kidney Diseases: [employer]Sync[employer]si Association of Diabetes Care & Education Specialists: diabeteseducator.org Summary It is important to have healthy eating habits because your blood sugar (glucose) levels are greatly affected by what you eat and drink. It is important to use alcohol carefully. A healthy meal plan will help you manage your blood glucose and lower your risk of heart disease. Your health care provider may recommend that you work with a dietitian to make a meal plan that is best for you. This information is not intended to replace advice given  to you by your health care provider. Make sure you discuss any questions you have with your health care provider. Document Revised: 01/11/2020 Document Reviewed: 01/12/2020 Elsevier Patient Education  2024 [employer].

## 2023-08-21 ENCOUNTER — Ambulatory Visit: Payer: PPO | Admitting: Audiologist

## 2023-09-16 ENCOUNTER — Encounter: Payer: Self-pay | Admitting: Nurse Practitioner

## 2023-09-16 ENCOUNTER — Ambulatory Visit: Payer: PPO | Admitting: Nurse Practitioner

## 2023-09-16 VITALS — BP 136/76 | HR 74 | Ht 61.0 in | Wt 147.8 lb

## 2023-09-16 DIAGNOSIS — Z794 Long term (current) use of insulin: Secondary | ICD-10-CM

## 2023-09-16 DIAGNOSIS — E1165 Type 2 diabetes mellitus with hyperglycemia: Secondary | ICD-10-CM

## 2023-09-16 DIAGNOSIS — Z7984 Long term (current) use of oral hypoglycemic drugs: Secondary | ICD-10-CM | POA: Diagnosis not present

## 2023-09-16 DIAGNOSIS — I10 Essential (primary) hypertension: Secondary | ICD-10-CM

## 2023-09-16 DIAGNOSIS — E782 Mixed hyperlipidemia: Secondary | ICD-10-CM | POA: Diagnosis not present

## 2023-09-16 DIAGNOSIS — E890 Postprocedural hypothyroidism: Secondary | ICD-10-CM

## 2023-09-16 MED ORDER — METFORMIN HCL 1000 MG PO TABS
1000.0000 mg | ORAL_TABLET | Freq: Two times a day (BID) | ORAL | 3 refills | Status: DC
Start: 2023-09-16 — End: 2023-12-24

## 2023-09-16 MED ORDER — TRESIBA FLEXTOUCH 100 UNIT/ML ~~LOC~~ SOPN
25.0000 [IU] | PEN_INJECTOR | Freq: Every day | SUBCUTANEOUS | 3 refills | Status: DC
Start: 2023-09-16 — End: 2023-10-01

## 2023-09-16 MED ORDER — INSULIN LISPRO (1 UNIT DIAL) 100 UNIT/ML (KWIKPEN)
8.0000 [IU] | PEN_INJECTOR | Freq: Three times a day (TID) | SUBCUTANEOUS | 3 refills | Status: DC
Start: 2023-09-16 — End: 2023-12-24

## 2023-09-16 NOTE — Progress Notes (Signed)
 Endocrinology Follow Up Note       09/16/2023, 10:53 AM   Subjective:    Patient ID: Dawn Anthony, female    DOB: 1951/04/25.  Dawn Anthony is being seen in follow up after being seen in consultation for management of currently uncontrolled symptomatic diabetes requested by  Sonny Masters, FNP.   Past Medical History:  Diagnosis Date   Diabetes mellitus without complication (HCC)    Gastritis    Hyperlipidemia    Hypertension    Thyroid disease     Past Surgical History:  Procedure Laterality Date   CORONARY ARTERY BYPASS GRAFT  2022   EYE SURGERY     KNEE SURGERY Bilateral    OTHER SURGICAL HISTORY     shoulder surgery   THYROID SURGERY      Social History   Socioeconomic History   Marital status: Widowed    Spouse name: Not on file   Number of children: 1   Years of education: Not on file   Highest education level: 12th grade  Occupational History   Not on file  Tobacco Use   Smoking status: Never   Smokeless tobacco: Never  Vaping Use   Vaping status: Never Used  Substance and Sexual Activity   Alcohol use: Never   Drug use: Never   Sexual activity: Not Currently  Other Topics Concern   Not on file  Social History Narrative   Grandson lives with her.    Social Drivers of Corporate investment banker Strain: Low Risk  (09/30/2022)   Overall Financial Resource Strain (CARDIA)    Difficulty of Paying Living Expenses: Not hard at all  Food Insecurity: No Food Insecurity (09/30/2022)   Hunger Vital Sign    Worried About Running Out of Food in the Last Year: Never true    Ran Out of Food in the Last Year: Never true  Recent Concern: Food Insecurity - Food Insecurity Present (09/23/2022)   Hunger Vital Sign    Worried About Running Out of Food in the Last Year: Sometimes true    Ran Out of Food in the Last Year: Sometimes true  Transportation Needs: No Transportation Needs  (09/30/2022)   PRAPARE - Administrator, Civil Service (Medical): No    Lack of Transportation (Non-Medical): No  Physical Activity: Insufficiently Active (08/19/2023)   Exercise Vital Sign    Days of Exercise per Week: 2 days    Minutes of Exercise per Session: 10 min  Stress: No Stress Concern Present (09/30/2022)   [employer] of Occupational Health - Occupational Stress Questionnaire    Feeling of Stress : Not at all  Social Connections: Socially Isolated (09/30/2022)   Social Connection and Isolation Panel [NHANES]    Frequency of Communication with Friends and Family: More than three times a week    Frequency of Social Gatherings with Friends and Family: More than three times a week    Attends Religious Services: Never    Database administrator or Organizations: No    Attends Banker Meetings: Never    Marital Status: Widowed    Family History  Problem Relation Age of Onset  Diabetes Mother    Heart disease Mother    Heart failure Mother    Heart attack Father    Heart disease Father    COPD Father    Diabetes Father    Dementia Sister    Heart attack Sister    Hypertension Sister    Diabetes Brother    Heart attack Brother    Alcohol abuse Brother    Cervical cancer Maternal Grandmother    Diabetes Paternal Grandfather     Outpatient Encounter Medications as of 09/16/2023  Medication Sig   aspirin EC 81 MG tablet Take 81 mg by mouth daily. Swallow whole.   carvedilol (COREG) 12.5 MG tablet Take 1 tablet (12.5 mg total) by mouth 2 (two) times daily with a meal.   cholecalciferol (VITAMIN D3) 25 MCG (1000 UT) tablet Take 50 Units by mouth daily.   Continuous Glucose Sensor (FREESTYLE LIBRE 3 PLUS SENSOR) MISC Change sensor every 15 days.   Insulin Pen Needle (BD PEN NEEDLE NANO U/F) 32G X 4 MM MISC UAD QID Dx E11.69   levothyroxine (SYNTHROID) 75 MCG tablet Take 1 tablet (75 mcg total) by mouth daily before breakfast.   lisinopril (ZESTRIL)  5 MG tablet TAKE 1 TABLET (5 MG TOTAL) BY MOUTH DAILY.   rosuvastatin (CRESTOR) 20 MG tablet Take 20 mg by mouth daily.   [DISCONTINUED] insulin degludec (TRESIBA FLEXTOUCH) 100 UNIT/ML FlexTouch Pen Inject 20 Units into the skin at bedtime.   [DISCONTINUED] insulin lispro (HUMALOG KWIKPEN) 100 UNIT/ML KwikPen Inject 5-11 Units into the skin 3 (three) times daily.   [DISCONTINUED] metFORMIN (GLUCOPHAGE) 1000 MG tablet Take 1 tablet (1,000 mg total) by mouth 2 (two) times daily with a meal.   insulin degludec (TRESIBA FLEXTOUCH) 100 UNIT/ML FlexTouch Pen Inject 25 Units into the skin at bedtime.   insulin lispro (HUMALOG KWIKPEN) 100 UNIT/ML KwikPen Inject 8-14 Units into the skin 3 (three) times daily.   metFORMIN (GLUCOPHAGE) 1000 MG tablet Take 1 tablet (1,000 mg total) by mouth 2 (two) times daily with a meal.   [DISCONTINUED] Continuous Glucose Sensor (FREESTYLE LIBRE 3 SENSOR) MISC PLACE 1 SENSOR ON THE SKIN EVERY 14 DAYS. USE TO CHECK GLUCOSE CONTINUOUSLY. DX: E11.65   [DISCONTINUED] loperamide (IMODIUM) 2 MG capsule Take 1 capsule (2 mg total) by mouth 4 (four) times daily as needed for diarrhea or loose stools.   [DISCONTINUED] ondansetron (ZOFRAN) 4 MG tablet Take 1 tablet (4 mg total) by mouth every 8 (eight) hours as needed.   No facility-administered encounter medications on file as of 09/16/2023.    ALLERGIES: Allergies  Allergen Reactions   Hydrochlorothiazide     Other Reaction(s): Unknown-Unspecified  Stopped due to pancreatitis   Insulin Glargine-Lixisenatide Nausea And Vomiting   Simvastatin     Other Reaction(s): Unknown-Unspecified  Stopped due to pancreatitis  Stopped due to pancreatitis   Penicillins Hives   Amoxicillin Itching and Rash   Cat Dander Itching and Rash   Latex Other (See Comments) and Rash    VACCINATION STATUS: Immunization History  Administered Date(s) Administered   Fluad Trivalent(High Dose 65+) 07/03/2023   Influenza Split 04/24/2013,  03/07/2017, 03/27/2018   Influenza, High Dose Seasonal PF 03/08/2021, 03/08/2021   Influenza, Quadrivalent, Recombinant, Inj, Pf 03/08/2021   Influenza, Seasonal, Injecte, Preservative Fre 04/27/2015, 02/28/2017   Influenza,inj,Quad PF,6+ Mos 04/02/2020   Influenza,trivalent, recombinat, inj, PF 04/24/2013, 03/07/2017, 03/27/2018   Influenza-Unspecified 04/27/2015, 02/28/2017, 03/08/2021   Moderna Sars-Covid-2 Vaccination 11/01/2019, 12/09/2019   PFIZER(Purple Top)SARS-COV-2 Vaccination 06/13/2020  Pneumococcal Conjugate-13 04/07/2018   Pneumococcal Polysaccharide-23 11/21/2007, 10/25/2019   Tdap 12/10/2006, 04/24/2017   Zoster, Live 03/22/2013    Diabetes She presents for her follow-up diabetic visit. Onset time: diagnosed at approx age of 4. Her disease course has been improving. There are no hypoglycemic associated symptoms. There are no diabetic associated symptoms. There are no hypoglycemic complications. 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 Risk factors for coronary artery disease include diabetes mellitus, dyslipidemia, family history, hypertension, sedentary lifestyle and post-menopausal. Current diabetic treatment includes intensive insulin program and oral agent (monotherapy). She is compliant with treatment most of the time. Her weight is decreasing steadily. She is following a generally healthy diet. When asked about meal planning, she reported none. She has not had a previous visit with a dietitian. She participates in exercise intermittently. Her home blood glucose trend is decreasing steadily. Her overall blood glucose range is >200 mg/dl. (She presents today with her CGM showing gross hyperglycemia overall.  Her most recent A1c on 07/03/23 was 10.7%, increasing from last visit of 9.7%.  She notes that she did have the stomach bug and a head cold prior to this reading.  Analysis of her CGM shows TIR 5%, TAR 95%, TBR 0% with a GMI of 10.5%.  She denies  any significant hypoglycemia.  She does admit to eating foods she knows she shouldn't.) An ACE inhibitor/angiotensin II receptor blocker is being taken. She does not see a podiatrist.Eye exam is current.    Review of systems  Constitutional: +decreasing body weight,  current Body mass index is 27.93 kg/m. , no fatigue, no subjective hyperthermia, no subjective hypothermia Eyes: no blurry vision, no xerophthalmia ENT: no sore throat, no nodules palpated in throat, no dysphagia/odynophagia, no hoarseness Cardiovascular: no chest pain, no shortness of breath, no palpitations, no leg swelling Respiratory: no cough, no shortness of breath Gastrointestinal: no nausea/vomiting/diarrhea Musculoskeletal: no muscle/joint aches Skin: no rashes, no hyperemia Neurological: no tremors, no numbness, no tingling, no dizziness Psychiatric: no depression, no anxiety  Objective:     BP 136/76 (BP Location: Left Arm, Patient Position: Sitting, Cuff Size: Large) Comment: Retake with manuel cuff  Pulse 74   Ht 5\' 1"  (1.549 m)   Wt 147 lb 12.8 oz (67 kg)   BMI 27.93 kg/m   Wt Readings from Last 3 Encounters:  09/16/23 147 lb 12.8 oz (67 kg)  07/03/23 151 lb (68.5 kg)  06/28/23 165 lb (74.8 kg)     BP Readings from Last 3 Encounters:  09/16/23 136/76  07/03/23 (!) 148/72  06/28/23 123/76      Physical Exam- Limited  Constitutional:  Body mass index is 27.93 kg/m. , not in acute distress, normal state of mind Eyes:  EOMI, no exophthalmos Musculoskeletal: no gross deformities, strength intact in all four extremities, no gross restriction of joint movements Skin:  no rashes, no hyperemia Neurological: no tremor with outstretched hands   Diabetic Foot Exam - Simple   No data filed     CMP ( most recent) CMP     Component Value Date/Time   NA 139 07/03/2023 0934   K 4.3 07/03/2023 0934   CL 103 07/03/2023 0934   CO2 22 07/03/2023 0934   GLUCOSE 210 (H) 07/03/2023 0934   GLUCOSE 405  (H) 06/28/2023 1620   BUN 22 07/03/2023 0934   CREATININE 0.76 07/03/2023 0934   CALCIUM 8.9 07/03/2023 0934   PROT 6.0 07/03/2023 0934   ALBUMIN 4.1 07/03/2023 0934  AST 18 07/03/2023 0934   ALT 13 07/03/2023 0934   ALKPHOS 59 07/03/2023 0934   BILITOT 0.3 07/03/2023 0934   GFRNONAA >60 06/28/2023 1620   GFRAA >60 07/09/2019 1456     Diabetic Labs (most recent): Lab Results  Component Value Date   HGBA1C 10.7 (H) 07/03/2023   HGBA1C 9.7 (A) 02/12/2023   HGBA1C 9.8 (H) 08/28/2022     Lipid Panel ( most recent) Lipid Panel     Component Value Date/Time   CHOL 107 07/03/2023 0934   TRIG 98 07/03/2023 0934   HDL 52 07/03/2023 0934   CHOLHDL 2.1 07/03/2023 0934   LDLCALC 37 07/03/2023 0934   LABVLDL 18 07/03/2023 0934      Lab Results  Component Value Date   TSH 2.010 07/03/2023   TSH 1.080 02/05/2023   TSH 1.530 08/28/2022   FREET4 1.74 02/05/2023           Assessment & Plan:   1) Type 2 diabetes mellitus with hyperglycemia, with long-term current use of insulin  She presents today with her CGM showing gross hyperglycemia overall.  Her most recent A1c on 07/03/23 was 10.7%, increasing from last visit of 9.7%.  She notes that she did have the stomach bug and a head cold prior to this reading.  Analysis of her CGM shows TIR 5%, TAR 95%, TBR 0% with a GMI of 10.5%.  She denies any significant hypoglycemia.  She does admit to eating foods she knows she shouldn't.  - Shterna Laramee has currently uncontrolled symptomatic type 2 DM since 73 years of age.   -Recent labs reviewed.  - I had a long discussion with her about the progressive nature of diabetes and the pathology behind its complications. -her diabetes is complicated by CAD with CABG and she remains at a high risk for more acute and chronic complications which include CAD, CVA, CKD, retinopathy, and neuropathy. These are all discussed in detail with her.  The following Lifestyle Medicine recommendations  according to American College of Lifestyle [facility]) were discussed and offered to patient and she agrees to start the journey:  A. Whole Foods, Plant-based plate comprising of fruits and vegetables, plant-based proteins, whole-grain carbohydrates was discussed in detail with the patient.   A list for source of those nutrients were also provided to the patient.  Patient will use only water or unsweetened tea for hydration. B.  The need to stay away from risky substances including alcohol, smoking; obtaining 7 to 9 hours of restorative sleep, at least 150 minutes of moderate intensity exercise weekly, the importance of healthy social connections,  and stress reduction techniques were discussed. C.  A full color page of  Calorie density of various food groups per pound showing examples of each food groups was provided to the patient.  - Nutritional counseling repeated at each appointment due to patients tendency to fall back in to old habits.  - The patient admits there is a room for improvement in their diet and drink choices. -  Suggestion is made for the patient to avoid simple carbohydrates from their diet including Cakes, Sweet Desserts / Pastries, Ice Cream, Soda (diet and regular), Sweet Tea, Candies, Chips, Cookies, Sweet Pastries, Store Bought Juices, Alcohol in Excess of 1-2 drinks a day, Artificial Sweeteners, Coffee Creamer, and "Sugar-free" Products. This will help patient to have stable blood glucose profile and potentially avoid unintended weight gain.   - I encouraged the patient to switch to unprocessed or minimally processed  complex starch and increased protein intake (animal or plant source), fruits, and vegetables.   - Patient is advised to stick to a routine mealtimes to eat 3 meals a day and avoid unnecessary snacks (to snack only to correct hypoglycemia).  - I have approached her with the following individualized plan to manage her diabetes and patient agrees:   -Due to her  hyperglycemia, she will tolerate slight adjustment in her Evaristo Bury to 25 units SQ nightly and Humalog to 8-14 units TID with meals if glucose is above 90 and she is eating (Specific instructions on how to titrate insulin dosage based on glucose readings given to patient in writing).   She can continue her Metformin 1000 mg po twice daily with meals.  She does have plans to exercise more now that the weather is nicer.  -she is encouraged to start monitoring glucose 4 times daily (using her CGM), before meals and before bed, and to call the clinic if she has readings less than 70 or above 300 for 3 tests in a row.  - she is warned not to take insulin without proper monitoring per orders. - Adjustment parameters are given to her for hypo and hyperglycemia in writing.  - her [place] Pepper was previously discontinued to de-escalate her treatment regimen.  - she is not ideal candidate for incretin therapy due to body habitus.   - Specific targets for  A1c; LDL, HDL, and Triglycerides were discussed with the patient.  2) Blood Pressure /Hypertension:  her blood pressure is controlled to target.   she is advised to continue her current medications including Coreg 12.5 mg po twice daily and Lisinopril 5 mg p.o. daily with breakfast.  3) Lipids/Hyperlipidemia:    Review of her recent lipid panel from 08/28/22 showed controlled LDL at 46 .  she is advised to continue Crestor 20 mg daily at bedtime.  Side effects and precautions discussed with her.  4)  Weight/Diet:  her Body mass index is 27.93 kg/m.  -  she is NOT a candidate for weight loss.   Exercise, and detailed carbohydrates information provided  -  detailed on discharge instructions.  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    She is currently on Levothyroxine 75 mcg po daily before breakfast.  Her recent TFTs are consistent with appropriate hormone replacement, she is advised to continue.   - The correct  intake of thyroid hormone (Levothyroxine, Synthroid), is on empty stomach first thing in the morning, with water, separated by at least 30 minutes from breakfast and other medications,  and separated by more than 4 hours from calcium, iron, multivitamins, acid reflux medications (PPIs).  - This medication is a life-long medication and will be needed to correct thyroid hormone imbalances for the rest of your life.  The dose may change from time to time, based on thyroid blood work.  - It is extremely important to be consistent taking this medication, near the same time each morning.  -AVOID TAKING PRODUCTS CONTAINING BIOTIN (commonly found in Hair, Skin, Nails vitamins) AS IT INTERFERES WITH THE VALIDITY OF THYROID FUNCTION BLOOD TESTS.  6) Chronic Care/Health Maintenance: -she is on ACEI/ARB and Statin medications and is encouraged to initiate and continue to follow up with Ophthalmology, Dentist, Podiatrist at least yearly or according to recommendations, and advised to stay away from smoking. I have recommended yearly flu vaccine and pneumonia vaccine at least every 5 years; moderate intensity exercise for up to 150  minutes weekly; and sleep for at least 7 hours a day.  - she is advised to maintain close follow up with Rakes, Doralee Albino, FNP for primary care needs, as well as her other providers for optimal and coordinated care.      I spent  41  minutes in the care of the patient today including review of labs from CMP, Lipids, Thyroid Function, Hematology (current and previous including abstractions from other facilities); face-to-face time discussing  her blood glucose readings/logs, discussing hypoglycemia and hyperglycemia episodes and symptoms, medications doses, her options of short and long term treatment based on the latest standards of care / guidelines;  discussion about incorporating lifestyle medicine;  and documenting the encounter. Risk reduction counseling performed per USPSTF  guidelines to reduce obesity and cardiovascular risk factors.     Please refer to Patient Instructions for Blood Glucose Monitoring and Insulin/Medications Dosing Guide"  in media tab for additional information. Please  also refer to " Patient Self Inventory" in the Media  tab for reviewed elements of pertinent patient history.  Dawn Anthony participated in the discussions, expressed understanding, and voiced agreement with the above plans.  All questions were answered to her satisfaction. she is encouraged to contact clinic should she have any questions or concerns prior to her return visit.     Follow up plan: - Return in about 3 months (around 12/17/2023) for Diabetes F/U with A1c in office, No previsit labs, Bring meter and logs.   Ronny Bacon, [facility] [address] [place], [place] [address] Phone: [phone] Fax: [phone]  09/16/2023, 10:53 AM

## 2023-09-18 ENCOUNTER — Other Ambulatory Visit: Payer: Self-pay | Admitting: *Deleted

## 2023-09-18 NOTE — Patient Instructions (Signed)
 Visit Information  Thank you for taking time to visit with me today. Please don't hesitate to contact me if I can be of assistance to you before our next scheduled telephone appointment.  Following are the goals we discussed today:   Goals Addressed             This Visit's Progress    RNCM Care Management Expected Outcomes: Monitor, Self-Manage and Reduce Symptoms of: DM, HTN       Current Barriers:  Knowledge Deficits related to plan of care for management of HTN and DMII  Chronic Disease Management support and education needs related to HTN and DMII   RNCM Clinical Goal(s):  Patient will verbalize basic understanding of  HTN and DMII disease process and self health management plan as evidenced by verbal explanation, recognizing symptoms and lifestyle modifications  attend all scheduled medical appointments: with primary care provider and specialist as evidenced by keeping all scheduled appointments demonstrate Improved and Ongoing adherence to prescribed treatment plan for HTN and DMII as evidenced by consistent medication compliance, symptom monitoring, continued lifestyle modifications continue to work with RN Care Manager to address care management and care coordination needs related to  HTN and DMII as evidenced by adherence to CM Team Scheduled appointments through collaboration with RN Care manager, provider, and care team.   Interventions: Evaluation of current treatment plan related to  self management and patient's adherence to plan as established by provider   Diabetes Interventions:  (Status:  Goal on track:  Yes.) Long Term Goal Assessed patient's understanding of A1c goal: <7% Provided education to patient about basic DM disease process. A1C remains above goal. Reports continued use with Libre. Reports fasting glucose this morning of 145, highest 299 lowest 48. RNCM reviewed with patient fasting glucose <130 and post prandial <180. Patient verbalized full understanding.  Patient reports that she works with her dietitian weekly and actively working to make lifestyle changes. RNCM discussed with patient above carb counting and being mindful of her intake. RNCM reviewed with patient proper hypoglycemia treatment. Reviewed medications with patient and discussed importance of medication adherence. Reports compliance with all medications Counseled on importance of regular laboratory monitoring as prescribed Discussed plans with patient for ongoing care management follow up and provided patient with direct contact information for care management team Provided patient with written educational materials related to hypo and hyperglycemia and importance of correct treatment Reviewed scheduled/upcoming provider appointments including:12-24-2023 Endocrinology,  Advised patient, providing education and rationale, to check cbg as recommended and record, calling provider for findings outside established parameters Review of patient status, including review of consultants reports, relevant laboratory and other test results, and medications completed Screening for signs and symptoms of depression related to chronic disease state  Assessed social determinant of health barriers Will need to schedule eye exam for 2025 Lab Results  Component Value Date   HGBA1C 10.7 (H) 07/03/2023      Hypertension Interventions:  (Status:  Goal on track:  Yes.) Long Term Goal Last practice recorded BP readings:  BP Readings from Last 3 Encounters:  09/16/23 136/76  07/03/23 (!) 148/72  06/28/23 123/76   Most recent eGFR/CrCl:  Lab Results  Component Value Date   EGFR 84 07/03/2023    No components found for: "CRCL"  Evaluation of current treatment plan related to hypertension self management and patient's adherence to plan as established by provider. Patient has not been regularly checking BP at home. RNCM reviewed with patient SBP<140 DBP<90. Denies any chest  pain, swelling, headaches. She  does endorse some dizziness; RNCM provided reinforced education with patient about changes patient slowly to decrease any dizziness Provided education to patient re: stroke prevention, s/s of heart attack and stroke. Education and support provided Reviewed medications with patient and discussed importance of compliance. Reports compliance Counseled on adverse effects of illicit drug and excessive alcohol use in patients with high blood pressure. Denies illicit drug and excessive alcohol use Counseled on the importance of exercise goals with target of 150 minutes per week Discussed plans with patient for ongoing care management follow up and provided patient with direct contact information for care management team Advised patient, providing education and rationale, to monitor blood pressure daily and record, calling PCP for findings outside established parameters Reviewed scheduled/upcoming provider appointments including: 11-12-2023 with Cardiology,  12-31-2023 with PCP Advised patient to discuss any acute changes with provider Provided education on prescribed diet ADA Discussed complications of poorly controlled blood pressure such as heart disease, stroke, circulatory complications, vision complications, kidney impairment, sexual dysfunction Screening for signs and symptoms of depression related to chronic disease state  Assessed social determinant of health barriers  Patient Goals/Self-Care Activities: Take all medications as prescribed Attend all scheduled provider appointments Call pharmacy for medication refills 3-7 days in advance of running out of medications Attend church or other social activities Perform all self care activities independently  Perform IADL's (shopping, preparing meals, housekeeping, managing finances) independently Call provider office for new concerns or questions  schedule appointment with eye doctor check blood sugar at prescribed times: before meals and at  bedtime check feet daily for cuts, sores or redness check blood pressure daily write blood pressure results in a log or diary  Follow Up Plan:  Telephone follow up appointment with care management team member scheduled for:  10-20-2023 at 10:30 am           Our next appointment is by telephone on 10-20-2023 at 10:30 am  Please call the care guide team at [phone] if you need to cancel or reschedule your appointment.   If you are experiencing a Mental Health or Behavioral Health Crisis or need someone to talk to, please call the Suicide and Crisis Lifeline: 988 call the [place] National Suicide Prevention Lifeline: (631)444-2290 or TTY: 206-081-9922 TTY 845-509-4001) to talk to a trained counselor call 1-800-273-TALK (toll free, 24 hour hotline)   Patient verbalizes understanding of instructions and care plan provided today and agrees to view in MyChart. Active MyChart status and patient understanding of how to access instructions and care plan via MyChart confirmed with patient.     Telephone follow up appointment with care management team member scheduled for:10-20-2023 at 10:30 am  Larey Brick, BSN RN [facility], [facility] RN Care Manager Direct Dial: [phone]  Fax: [phone]   Hypoglycemia Hypoglycemia is when the amount of sugar, or glucose, in your blood is too low. Low blood sugar can happen if you have diabetes or if you don't have diabetes. It may be an emergency. What are the causes? Low blood sugar happens most often in people who have diabetes. It may be caused by: Diabetes medicine. Not eating enough, or not eating often enough. Being more active than normal. If you don't have diabetes, you may still get low blood sugar if: There's a tumor in your pancreas. A tumor is a growth of cells that isn't normal. You don't eat enough, or you fast. Fasting is when you don't eat for long  periods at a time. You have a bad infection  or illness. You have problems after weight loss surgery. You have kidney or liver problems. You take certain medicines. What increases the risk? You're more likely to have low blood sugar if: You have diabetes and take medicine for it. You drink a lot of alcohol. You get sick. What are the signs or symptoms? Mild Hunger or feeling like you may vomit. Sweating and feeling cold to the touch. Feeling dizzy or light-headed. Being sleepy or having trouble sleeping. A headache. Blurry vision. Mood changes. These include feeling worried, nervous, or easily annoyed. Moderate Feeling confused. Changes in the way you act. Weakness. An uneven heartbeat. Very bad Having very low blood sugar is an emergency. It can cause: Fainting. Seizures. A coma. Death. How is this diagnosed?  Low blood sugar can be found with a blood test. This test tells you how much sugar is in your blood. It's done while you're having symptoms. Your health care provider may also do an exam and look at your medical history. How is this treated? Treating low blood sugar If you have low blood sugar, eat or drink something with sugar in it right away. The food or drink should have 15 grams of a fast-acting carbohydrate (carb). Options include: 4 oz (120 mL) of fruit juice. 4 oz (120 mL) of soda (not diet soda). A few pieces of hard candy. Check food labels to see how many pieces to eat. 1 Tbsp (15 mL) of sugar or honey. 4 glucose tablets. 1 tube of glucose gel. Treating low blood sugar if you have diabetes Talk with your provider about how much carb you should take. If you're alert and can swallow safely, you may follow the 15:15 rule: Take 15 grams of a fast-acting carb. Check your blood sugar 15 minutes after you take the carb. If your blood sugar is still at or below 70 mg/dL (3.9 mmol/L), take 15 grams of a carb again. If your blood sugar doesn't go above 70 mg/dL (3.9 mmol/L) after 3 tries, get help right  away. After your blood sugar goes back to normal, eat a meal or a snack within 1 hour. Always keep 15 grams of a fast-acting carb with you. This could be: 4 glucose tablets. A few pieces of hard candy. 1 Tbsp (15 mL) of honey or sugar. 1 tube of glucose gel. Treating very low blood sugar If your blood sugar is less than 54 mg/dL (3 mmol/L), it's an emergency. Get help right away. If you can't eat or drink, you will need to be given glucagon. A family member or friend should learn how to check your blood sugar and give you glucagon. Ask your provider if you should keep a glucagon kit at home. You may also need to be treated in a hospital. Follow these instructions at home: If you have diabetes: Always keep a fast-acting carb (15 grams) with you. Follow your diabetes care plan. Make sure you: Know the symptoms of low blood sugar. Check your blood sugar as often as told. Always check it before and after you exercise. Always check your blood sugar before you drive. Take your medicines as told. Eat on time. Do not skip meals. Share your diabetes care plan with: Your work or school. The people you live with. Wear an alert bracelet or carry a card that says you have diabetes. General instructions If you drink alcohol: Limit how much you have to: 0-1 drink a day if you're  female. 0-2 drinks a day if you're female. Know how much alcohol is in your drink. In the [place] one drink is one 12 oz bottle of beer (355 mL), one 5 oz glass of wine (148 mL), or one 1 oz glass of hard liquor (44 mL). Be sure to eat food when you drink alcohol. Be sure to check your blood sugar after you drink. Alcohol may lead to low blood sugar later. Where to find more information American Diabetes Association (ADA): diabetes.org Contact a health care provider if: You have low blood sugar often. You have diabetes and are having trouble keeping your blood sugar in the right range. Get help right away if: You can't  get your blood sugar above 70 mg/dL (3.9 mmol/L) after 3 tries. Your blood sugar is below 54 mg/dL (3 mmol/L). You have a seizure. You faint. These symptoms may be an emergency. Call 911 right away. Do not wait to see if the symptoms will go away. Do not drive yourself to the hospital. This information is not intended to replace advice given to you by your health care provider. Make sure you discuss any questions you have with your health care provider. Document Revised: 03/13/2023 Document Reviewed: 08/28/2022 Elsevier Patient Education  2024 Else[employer] Inc.   Carbohydrate Counting for Diabetes Mellitus, Adult Carbohydrate counting is a method of keeping track of how many carbohydrates you eat. Eating carbohydrates increases the amount of sugar (glucose) in the blood. Counting how many carbohydrates you eat improves how well you manage your blood glucose. This, in turn, helps you manage your diabetes. Carbohydrates are measured in grams (g) per serving. It is important to know how many carbohydrates (in grams or by serving size) you can have in each meal. This is different for every person. A dietitian can help you make a meal plan and calculate how many carbohydrates you should have at each meal and snack. What foods contain carbohydrates? Carbohydrates are found in the following foods: Grains, such as breads and cereals. Dried beans and soy products. Starchy vegetables, such as potatoes, peas, and corn. Fruit and fruit juices. Milk and yogurt. Sweets and snack foods, such as cake, cookies, candy, chips, and soft drinks. How do I count carbohydrates in foods? There are two ways to count carbohydrates in food. You can read food labels or learn standard serving sizes of foods. You can use either of these methods or a combination of both. Using the Nutrition Facts label The Nutrition Facts list is included on the labels of almost all packaged foods and beverages in the [place]. It  includes: The serving size. Information about nutrients in each serving, including the grams of carbohydrate per serving. To use the Nutrition Facts, decide how many servings you will have. Then, multiply the number of servings by the number of carbohydrates per serving. The resulting number is the total grams of carbohydrates that you will be having. Learning the standard serving sizes of foods When you eat carbohydrate foods that are not packaged or do not include Nutrition Facts on the label, you need to measure the servings in order to count the grams of carbohydrates. Measure the foods that you will eat with a food scale or measuring cup, if needed. Decide how many standard-size servings you will eat. Multiply the number of servings by 15. For foods that contain carbohydrates, one serving equals 15 g of carbohydrates. For example, if you eat 2 cups or 10 oz (300 g) of strawberries, you will have  eaten 2 servings and 30 g of carbohydrates (2 servings x 15 g = 30 g). For foods that have more than one food mixed, such as soups and casseroles, you must count the carbohydrates in each food that is included. The following list contains standard serving sizes of common carbohydrate-rich foods. Each of these servings has about 15 g of carbohydrates: 1 slice of bread. 1 six-inch (15 cm) tortilla. ? cup or 2 oz (53 g) cooked rice or pasta.  cup or 3 oz (85 g) cooked or canned, drained and rinsed beans or lentils.  cup or 3 oz (85 g) starchy vegetable, such as peas, corn, or squash.  cup or 4 oz (120 g) hot cereal.  cup or 3 oz (85 g) boiled or mashed potatoes, or  or 3 oz (85 g) of a large baked potato.  cup or 4 fl oz (118 mL) fruit juice. 1 cup or 8 fl oz (237 mL) milk. 1 small or 4 oz (106 g) apple.  or 2 oz (63 g) of a medium banana. 1 cup or 5 oz (150 g) strawberries. 3 cups or 1 oz (28.3 g) popped popcorn. What is an example of carbohydrate counting? To calculate the grams of  carbohydrates in this sample meal, follow the steps shown below. Sample meal 3 oz (85 g) chicken breast. ? cup or 4 oz (106 g) brown rice.  cup or 3 oz (85 g) corn. 1 cup or 8 fl oz (237 mL) milk. 1 cup or 5 oz (150 g) strawberries with sugar-free whipped topping. Carbohydrate calculation Identify the foods that contain carbohydrates: Rice. Corn. Milk. Strawberries. Calculate how many servings you have of each food: 2 servings rice. 1 serving corn. 1 serving milk. 1 serving strawberries. Multiply each number of servings by 15 g: 2 servings rice x 15 g = 30 g. 1 serving corn x 15 g = 15 g. 1 serving milk x 15 g = 15 g. 1 serving strawberries x 15 g = 15 g. Add together all of the amounts to find the total grams of carbohydrates eaten: 30 g + 15 g + 15 g + 15 g = 75 g of carbohydrates total. What are tips for following this plan? Shopping Develop a meal plan and then make a shopping list. Buy fresh and frozen vegetables, fresh and frozen fruit, dairy, eggs, beans, lentils, and whole grains. Look at food labels. Choose foods that have more fiber and less sugar. Avoid processed foods and foods with added sugars. Meal planning Aim to have the same number of grams of carbohydrates at each meal and for each snack time. Plan to have regular, balanced meals and snacks. Where to find more information American Diabetes Association: diabetes.org Centers for Disease Control and Prevention: [employer] Academy of Nutrition and Dietetics: eatright.org Association of Diabetes Care & Education Specialists: diabeteseducator.org Summary Carbohydrate counting is a method of keeping track of how many carbohydrates you eat. Eating carbohydrates increases the amount of sugar (glucose) in your blood. Counting how many carbohydrates you eat improves how well you manage your blood glucose. This helps you manage your diabetes. A dietitian can help you make a meal plan and calculate how many  carbohydrates you should have at each meal and snack. This information is not intended to replace advice given to you by your health care provider. Make sure you discuss any questions you have with your health care provider. Document Revised: 01/11/2020 Document Reviewed: 01/12/2020 Elsevier Patient Education  2024 [employer].

## 2023-09-18 NOTE — Patient Outreach (Signed)
 Care Management   Visit Note  09/18/2023 Name: Dawn Anthony MRN: [id number] DOB: [birth year]  Subjective: Dawn Anthony is a 73 y.o. year old female who is a primary care patient of Rakes, Doralee Albino, FNP. The Care Management team was consulted for assistance.      Engaged with patient spoke with patient by telephone.    Goals Addressed             This Visit's Progress    RNCM Care Management Expected Outcomes: Monitor, Self-Manage and Reduce Symptoms of: DM, HTN       Current Barriers:  Knowledge Deficits related to plan of care for management of HTN and DMII  Chronic Disease Management support and education needs related to HTN and DMII   RNCM Clinical Goal(s):  Patient will verbalize basic understanding of  HTN and DMII disease process and self health management plan as evidenced by verbal explanation, recognizing symptoms and lifestyle modifications  attend all scheduled medical appointments: with primary care provider and specialist as evidenced by keeping all scheduled appointments demonstrate Improved and Ongoing adherence to prescribed treatment plan for HTN and DMII as evidenced by consistent medication compliance, symptom monitoring, continued lifestyle modifications continue to work with RN Care Manager to address care management and care coordination needs related to  HTN and DMII as evidenced by adherence to CM Team Scheduled appointments through collaboration with RN Care manager, provider, and care team.   Interventions: Evaluation of current treatment plan related to  self management and patient's adherence to plan as established by provider   Diabetes Interventions:  (Status:  Goal on track:  Yes.) Long Term Goal Assessed patient's understanding of A1c goal: <7% Provided education to patient about basic DM disease process. A1C remains above goal. Reports continued use with Libre. Reports fasting glucose this morning of 145, highest 299 lowest 48. RNCM reviewed  with patient fasting glucose <130 and post prandial <180. Patient verbalized full understanding. Patient reports that she works with her dietitian weekly and actively working to make lifestyle changes. RNCM discussed with patient above carb counting and being mindful of her intake. RNCM reviewed with patient proper hypoglycemia treatment. Reviewed medications with patient and discussed importance of medication adherence. Reports compliance with all medications Counseled on importance of regular laboratory monitoring as prescribed Discussed plans with patient for ongoing care management follow up and provided patient with direct contact information for care management team Provided patient with written educational materials related to hypo and hyperglycemia and importance of correct treatment Reviewed scheduled/upcoming provider appointments including:12-24-2023 Endocrinology,  Advised patient, providing education and rationale, to check cbg as recommended and record, calling provider for findings outside established parameters Review of patient status, including review of consultants reports, relevant laboratory and other test results, and medications completed Screening for signs and symptoms of depression related to chronic disease state  Assessed social determinant of health barriers Will need to schedule eye exam for 2025 Lab Results  Component Value Date   HGBA1C 10.7 (H) 07/03/2023      Hypertension Interventions:  (Status:  Goal on track:  Yes.) Long Term Goal Last practice recorded BP readings:  BP Readings from Last 3 Encounters:  09/16/23 136/76  07/03/23 (!) 148/72  06/28/23 123/76   Most recent eGFR/CrCl:  Lab Results  Component Value Date   EGFR 84 07/03/2023    No components found for: "CRCL"  Evaluation of current treatment plan related to hypertension self management and patient's adherence to plan as established by provider.  Patient has not been regularly checking BP at  home. RNCM reviewed with patient SBP<140 DBP<90. Denies any chest pain, swelling, headaches. She does endorse some dizziness; RNCM provided reinforced education with patient about changes patient slowly to decrease any dizziness Provided education to patient re: stroke prevention, s/s of heart attack and stroke. Education and support provided Reviewed medications with patient and discussed importance of compliance. Reports compliance Counseled on adverse effects of illicit drug and excessive alcohol use in patients with high blood pressure. Denies illicit drug and excessive alcohol use Counseled on the importance of exercise goals with target of 150 minutes per week Discussed plans with patient for ongoing care management follow up and provided patient with direct contact information for care management team Advised patient, providing education and rationale, to monitor blood pressure daily and record, calling PCP for findings outside established parameters Reviewed scheduled/upcoming provider appointments including: 11-12-2023 with Cardiology,  12-31-2023 with PCP Advised patient to discuss any acute changes with provider Provided education on prescribed diet ADA Discussed complications of poorly controlled blood pressure such as heart disease, stroke, circulatory complications, vision complications, kidney impairment, sexual dysfunction Screening for signs and symptoms of depression related to chronic disease state  Assessed social determinant of health barriers  Patient Goals/Self-Care Activities: Take all medications as prescribed Attend all scheduled provider appointments Call pharmacy for medication refills 3-7 days in advance of running out of medications Attend church or other social activities Perform all self care activities independently  Perform IADL's (shopping, preparing meals, housekeeping, managing finances) independently Call provider office for new concerns or questions  schedule  appointment with eye doctor check blood sugar at prescribed times: before meals and at bedtime check feet daily for cuts, sores or redness check blood pressure daily write blood pressure results in a log or diary  Follow Up Plan:  Telephone follow up appointment with care management team member scheduled for:  10-20-2023 at 10:30 am              Consent to Services:  Patient was given information about care management services, agreed to services, and gave verbal consent to participate.   Plan: Telephone follow up appointment with care management team member scheduled for:10-20-2023 a 10:30 am  Larey Brick, BSN RN [facility], [facility] RN Care Manager Direct Dial: [phone]  Fax: [phone]

## 2023-10-01 ENCOUNTER — Other Ambulatory Visit: Payer: Self-pay | Admitting: Nurse Practitioner

## 2023-10-01 ENCOUNTER — Ambulatory Visit: Payer: Medicare Other

## 2023-10-01 VITALS — BP 136/76 | HR 74 | Ht 61.0 in | Wt 147.0 lb

## 2023-10-01 DIAGNOSIS — Z0000 Encounter for general adult medical examination without abnormal findings: Secondary | ICD-10-CM | POA: Diagnosis not present

## 2023-10-01 NOTE — Patient Instructions (Signed)
 There are several Eye Doctors in your area. Here are a few that usually accept all insurance types: Please let us know if you require a referral for an eye exam appointment. Thank you!  [facility]) [address] [place], [place] [address] Phone: [phone]  [facility] [address] Thera Flake [place], [place] [address] Phone: [phone]  [facility] [address] [place], [place] [address] Phone: [phone]  [facility] [address] [place], [place] [address] Phone: [phone]  [facility] [address] [place], [place] [address] Phone: [phone]  [facility] [address] [place], [place] [address] Phone: [phone] Ms. Giddings , Thank you for taking time to come for your Medicare Wellness Visit. I appreciate your ongoing commitment to your health goals. Please review the following plan we discussed and let me know if I can assist you in the future.   Referrals/Orders/Follow-Ups/Clinician Recommendations: As discussed please make your Diabetic Eye exam as soon as possible. Don't forget to discuss your concerns about Dementia with your provider at your next visit 12/31/23 at 8:50am. Thank you for your time, it was a pleasure talking with you this morning.   This is a list of the screening recommended for you and due dates:  Health Maintenance  Topic Date Due   Yearly kidney health urinalysis for diabetes  08/28/2023   Eye exam for diabetics  08/28/2023   Medicare Annual Wellness Visit  09/30/2023   Zoster (Shingles) Vaccine (1 of 2) 10/01/2023*   COVID-19 Vaccine (4 - 2024-25 season) 10/16/2024*   Complete foot exam   10/02/2023   Hemoglobin A1C  12/31/2023   Flu Shot  01/23/2024   Yearly kidney function blood test for diabetes  07/02/2024   Mammogram  07/15/2025   Colon Cancer Screening  02/06/2027   DTaP/Tdap/Td vaccine (3 - Td or Tdap) 04/25/2027   Pneumonia Vaccine  Completed   DEXA scan (bone density measurement)  Completed   Hepatitis C Screening   Completed   HPV Vaccine  Aged Out  *Topic was postponed. The date shown is not the original due date.    Advanced directives: (Declined) Advance directive discussed with you today. Even though you declined this today, please call our office should you change your mind, and we can give you the proper paperwork for you to fill out.  Next Medicare Annual Wellness Visit scheduled for next year: Yes

## 2023-10-01 NOTE — Progress Notes (Signed)
 Subjective:   Dawn Anthony is a 73 y.o. who presents for a Medicare Wellness preventive visit.  Visit Complete: Virtual I connected with  Dawn Anthony on 10/01/23 by a audio enabled telemedicine application and verified that I am speaking with the correct person using two identifiers.  Patient Location: Home  Provider Location: Home Office  I discussed the limitations of evaluation and management by telemedicine. The patient expressed understanding and agreed to proceed.  Vital Signs: Because this visit was a virtual/telehealth visit, some criteria may be missing or patient reported. Any vitals not documented were not able to be obtained and vitals that have been documented are patient reported.  VideoDeclined- This patient declined Librarian, academic. Therefore the visit was completed with audio only.  Persons Participating in Visit: Patient.  AWV Questionnaire: No: Patient Medicare AWV questionnaire was not completed prior to this visit.  Cardiac Risk Factors include: advanced age (>45men, >5 women);diabetes mellitus;dyslipidemia     Objective:    Today's Vitals   10/01/23 1020  BP: 136/76  Pulse: 74  Weight: 147 lb (66.7 kg)  Height: 5\' 1"  (1.549 m)   Body mass index is 27.78 kg/m.     10/01/2023   11:08 AM 06/28/2023    3:49 PM 09/30/2022    1:46 PM 09/04/2022    1:07 PM 06/25/2022    8:15 PM 07/09/2019    1:58 PM 07/06/2019    1:02 PM  Advanced Directives  Does Patient Have a Medical Advance Directive? No No No No No No No  Would patient like information on creating a medical advance directive?   No - Patient declined Yes (MAU/Ambulatory/Procedural Areas - Information given)   No - Patient declined    Current Medications (verified) Outpatient Encounter Medications as of 10/01/2023  Medication Sig   aspirin EC 81 MG tablet Take 81 mg by mouth daily. Swallow whole.   Calcium-Magnesium-Vitamin D (CALCIUM 1200+D3 PO) Take 1 tablet by  mouth daily.   carvedilol (COREG) 12.5 MG tablet Take 1 tablet (12.5 mg total) by mouth 2 (two) times daily with a meal.   Continuous Glucose Sensor (FREESTYLE LIBRE 3 PLUS SENSOR) MISC Change sensor every 15 days.   insulin lispro (HUMALOG KWIKPEN) 100 UNIT/ML KwikPen Inject 8-14 Units into the skin 3 (three) times daily.   Insulin Pen Needle (BD PEN NEEDLE NANO U/F) 32G X 4 MM MISC UAD QID Dx E11.69   levothyroxine (SYNTHROID) 75 MCG tablet Take 1 tablet (75 mcg total) by mouth daily before breakfast.   lisinopril (ZESTRIL) 5 MG tablet TAKE 1 TABLET (5 MG TOTAL) BY MOUTH DAILY.   metFORMIN (GLUCOPHAGE) 1000 MG tablet Take 1 tablet (1,000 mg total) by mouth 2 (two) times daily with a meal.   rosuvastatin (CRESTOR) 20 MG tablet Take 20 mg by mouth daily.   [DISCONTINUED] insulin degludec (TRESIBA FLEXTOUCH) 100 UNIT/ML FlexTouch Pen Inject 25 Units into the skin at bedtime.   cholecalciferol (VITAMIN D3) 25 MCG (1000 UT) tablet Take 50 Units by mouth daily. (Patient not taking: Reported on 10/01/2023)   No facility-administered encounter medications on file as of 10/01/2023.    Allergies (verified) Hydrochlorothiazide, Insulin glargine-lixisenatide, Simvastatin, Penicillins, Amoxicillin, Cat dander, and Latex   History: Past Medical History:  Diagnosis Date   Diabetes mellitus without complication (HCC)    Gastritis    Hyperlipidemia    Hypertension    Thyroid disease    Past Surgical History:  Procedure Laterality Date   CORONARY ARTERY BYPASS  GRAFT  2022   EYE SURGERY     KNEE SURGERY Bilateral    OTHER SURGICAL HISTORY     shoulder surgery   THYROID SURGERY     Family History  Problem Relation Age of Onset   Diabetes Mother    Heart disease Mother    Heart failure Mother    Heart attack Father    Heart disease Father    COPD Father    Diabetes Father    Dementia Sister    Heart attack Sister    Hypertension Sister    Diabetes Brother    Heart attack Brother    Alcohol  abuse Brother    Cervical cancer Maternal Grandmother    Diabetes Paternal Grandfather    Social History   Socioeconomic History   Marital status: Widowed    Spouse name: Not on file   Number of children: 1   Years of education: Not on file   Highest education level: 12th grade  Occupational History   Not on file  Tobacco Use   Smoking status: Never   Smokeless tobacco: Never  Vaping Use   Vaping status: Never Used  Substance and Sexual Activity   Alcohol use: Never   Drug use: Never   Sexual activity: Not Currently  Other Topics Concern   Not on file  Social History Narrative   Grandson lives with her.    Social Drivers of Corporate investment banker Strain: Low Risk  (10/01/2023)   Overall Financial Resource Strain (CARDIA)    Difficulty of Paying Living Expenses: Not hard at all  Food Insecurity: No Food Insecurity (10/01/2023)   Hunger Vital Sign    Worried About Running Out of Food in the Last Year: Never true    Ran Out of Food in the Last Year: Never true  Transportation Needs: No Transportation Needs (10/01/2023)   PRAPARE - Administrator, Civil Service (Medical): No    Lack of Transportation (Non-Medical): No  Physical Activity: Insufficiently Active (10/01/2023)   Exercise Vital Sign    Days of Exercise per Week: 3 days    Minutes of Exercise per Session: 30 min  Stress: No Stress Concern Present (10/01/2023)   [employer] of Occupational Health - Occupational Stress Questionnaire    Feeling of Stress : Not at all  Social Connections: Moderately Isolated (10/01/2023)   Social Connection and Isolation Panel [NHANES]    Frequency of Communication with Friends and Family: More than three times a week    Frequency of Social Gatherings with Friends and Family: More than three times a week    Attends Religious Services: More than 4 times per year    Active Member of [employer] or Organizations: No    Attends Banker Meetings: Never    Marital  Status: Widowed    Tobacco Counseling Counseling given: Yes    Clinical Intake:  Pre-visit preparation completed: Yes  Pain : No/denies pain     BMI - recorded: 27.78 Nutritional Status: BMI 25 -29 Overweight Nutritional Risks: None Diabetes: Yes CBG done?: No (104 before 140 2hrs after meal)  Lab Results  Component Value Date   HGBA1C 10.7 (H) 07/03/2023   HGBA1C 9.7 (A) 02/12/2023   HGBA1C 9.8 (H) 08/28/2022     How often do you need to have someone help you when you read instructions, pamphlets, or other written materials from your doctor or pharmacy?: 1 - Never  Interpreter Needed?: No  Information  entered by :: Alia T/CMA   Activities of Daily Living     10/01/2023   10:57 AM  In your present state of health, do you have any difficulty performing the following activities:  Hearing? 1  Comment Pt stated sometimes she hears ringing off and on/will get hearing test done at office at next visit w/pcp  Vision? 1  Comment Pt denies vision def-pt wears Readers  Pt goes to Dr. Nedra Hai at [facility] in [place] in [place]  Difficulty concentrating or making decisions? 0  Walking or climbing stairs? 0  Dressing or bathing? 0  Doing errands, shopping? 0  Preparing Food and eating ? N  Using the Toilet? N  In the past six months, have you accidently leaked urine? N  Do you have problems with loss of bowel control? N  Managing your Medications? N  Managing your Finances? N  Housekeeping or managing your Housekeeping? N    Patient Care Team: Sonny Masters, FNP as PCP - General (Family Medicine) Ricky Stabs, RN as VBCI Care Management (General Practice)  Indicate any recent Medical Services you may have received from other than [facility] providers in the past year (date may be approximate).     Assessment:   This is a routine wellness examination for Maryl.  Hearing/Vision screen Hearing Screening - Comments:: Pt stated sometimes she hears ringing  off and on/will get hearing test done at office at next visit w/pcp Vision Screening - Comments:: Pt denies vision def-pt wears Readers Pt goes to Dr. Nedra Hai at [facility] in [place] in [place]   Goals Addressed             This Visit's Progress    Patient Stated       Pt would like to loose 30lbs: goal 128lbs       Depression Screen     10/01/2023   11:30 AM 10/01/2023   11:28 AM 08/19/2023   10:12 AM 07/03/2023    8:36 AM 12/31/2022    8:18 AM 09/30/2022    1:45 PM 09/25/2022    8:45 AM  PHQ 2/9 Scores  PHQ - 2 Score 0 0 0 0 1 0 0  PHQ- 9 Score    6 4 0 2    Fall Risk     10/01/2023   10:54 AM 07/17/2023   10:23 AM 07/03/2023    8:36 AM 12/31/2022    8:18 AM 09/30/2022    1:44 PM  Fall Risk   Falls in the past year? 0 0 0 1 1  Number falls in past yr: 0   0 1  Injury with Fall? 0   1 1  Risk for fall due to : No Fall Risks No Fall Risks No Fall Risks  History of fall(s);Impaired balance/gait;Orthopedic patient  Follow up Falls prevention discussed;Falls evaluation completed Falls evaluation completed Falls evaluation completed Falls prevention discussed Education provided;Falls prevention discussed    MEDICARE RISK AT HOME:  Medicare Risk at Home Any stairs in or around the home?: No If so, are there any without handrails?: No Home free of loose throw rugs in walkways, pet beds, electrical cords, etc?: Yes Adequate lighting in your home to reduce risk of falls?: Yes Life alert?: No Use of a cane, walker or w/c?: No Grab bars in the bathroom?: No Shower chair or bench in shower?: No Elevated toilet seat or a handicapped toilet?: No  TIMED UP AND GO:  Was the test performed?  No  Cognitive Function: 6CIT completed        10/01/2023   11:13 AM 09/30/2022    1:46 PM  6CIT Screen  What Year? 0 points 0 points  What month? 0 points 0 points  What time? 0 points 0 points  Count back from 20 0 points 0 points  Months in reverse 0 points 0 points  Repeat phrase 0  points 0 points  Total Score 0 points 0 points    Immunizations Immunization History  Administered Date(s) Administered   Fluad Trivalent(High Dose 65+) 07/03/2023   Influenza Split 04/24/2013, 03/07/2017, 03/27/2018   Influenza, High Dose Seasonal PF 03/08/2021, 03/08/2021   Influenza, Quadrivalent, Recombinant, Inj, Pf 03/08/2021   Influenza, Seasonal, Injecte, Preservative Fre 04/27/2015, 02/28/2017   Influenza,inj,Quad PF,6+ Mos 04/02/2020   Influenza,trivalent, recombinat, inj, PF 04/24/2013, 03/07/2017, 03/27/2018   Influenza-Unspecified 04/27/2015, 02/28/2017, 03/08/2021   Moderna Sars-Covid-2 Vaccination 11/01/2019, 12/09/2019   PFIZER(Purple Top)SARS-COV-2 Vaccination 06/13/2020   Pneumococcal Conjugate-13 04/07/2018   Pneumococcal Polysaccharide-23 11/21/2007, 10/25/2019   Tdap 12/10/2006, 04/24/2017   Zoster, Live 03/22/2013    Screening Tests Health Maintenance  Topic Date Due   Diabetic kidney evaluation - Urine ACR  08/28/2023   OPHTHALMOLOGY EXAM  08/28/2023   Zoster Vaccines- Shingrix (1 of 2) 10/01/2023 (Originally 07/22/1969)   COVID-19 Vaccine (4 - 2024-25 season) 10/16/2024 (Originally 02/23/2023)   FOOT EXAM  10/02/2023   HEMOGLOBIN A1C  12/31/2023   INFLUENZA VACCINE  01/23/2024   Diabetic kidney evaluation - eGFR measurement  07/02/2024   Medicare Annual Wellness (AWV)  09/30/2024   MAMMOGRAM  07/15/2025   Colonoscopy  02/06/2027   DTaP/Tdap/Td (3 - Td or Tdap) 04/25/2027   Pneumonia Vaccine 40+ Years old  Completed   DEXA SCAN  Completed   Hepatitis C Screening  Completed   HPV VACCINES  Aged Out    Health Maintenance  Health Maintenance Due  Topic Date Due   Diabetic kidney evaluation - Urine ACR  08/28/2023   OPHTHALMOLOGY EXAM  08/28/2023   Health Maintenance Items Addressed: See Nurse Notes  Additional Screening:  Vision Screening: Recommended annual ophthalmology exams for early detection of glaucoma and other disorders of the  eye.  Dental Screening: Recommended annual dental exams for proper oral hygiene  Community Resource Referral / Chronic Care Management: CRR required this visit?  No   CCM required this visit?  No     Plan:     I have personally reviewed and noted the following in the patient's chart:   Medical and social history Use of alcohol, tobacco or illicit drugs  Current medications and supplements including opioid prescriptions. Patient is not currently taking opioid prescriptions. Functional ability and status Nutritional status Physical activity Advanced directives List of other physicians Hospitalizations, surgeries, and ER visits in previous 12 months Vitals Screenings to include cognitive, depression, and falls Referrals and appointments  In addition, I have reviewed and discussed with patient certain preventive protocols, quality metrics, and best practice recommendations. A written personalized care plan for preventive services as well as general preventive health recommendations were provided to patient.     Arta Silence, CMA   10/01/2023   After Visit Summary: (MyChart) Due to this being a telephonic visit, the after visit summary with patients personalized plan was offered to patient via MyChart   Notes: Please refer to Routing Comments.

## 2023-10-14 ENCOUNTER — Other Ambulatory Visit: Payer: HMO

## 2023-10-18 ENCOUNTER — Other Ambulatory Visit: Payer: Self-pay | Admitting: Nurse Practitioner

## 2023-10-20 ENCOUNTER — Other Ambulatory Visit: Admitting: *Deleted

## 2023-10-20 NOTE — Telephone Encounter (Signed)
 Yes, it is ok to switch manufacturers

## 2023-10-23 DIAGNOSIS — M549 Dorsalgia, unspecified: Secondary | ICD-10-CM | POA: Diagnosis not present

## 2023-10-29 DIAGNOSIS — H40033 Anatomical narrow angle, bilateral: Secondary | ICD-10-CM | POA: Diagnosis not present

## 2023-10-29 DIAGNOSIS — H2513 Age-related nuclear cataract, bilateral: Secondary | ICD-10-CM | POA: Diagnosis not present

## 2023-10-29 LAB — HM DIABETES EYE EXAM

## 2023-10-30 ENCOUNTER — Other Ambulatory Visit: Payer: Self-pay

## 2023-10-30 ENCOUNTER — Other Ambulatory Visit: Payer: Self-pay | Admitting: *Deleted

## 2023-10-30 NOTE — Patient Instructions (Signed)
 Visit Information  Thank you for taking time to visit with me today. Please don't hesitate to contact me if I can be of assistance to you before our next scheduled appointment.  Your next care management appointment is by telephone on 11-27-2023 at 1:00 pm  Telephone follow-up in 1 month  Please call the care guide team at [phone] if you need to cancel, schedule, or reschedule an appointment.   Please call the Suicide and Crisis Lifeline: 988 call the [place]  National Suicide Prevention Lifeline: 407-868-9839 or TTY: 646-134-6866 TTY (705)391-4101) to talk to a trained counselor call 1-800-273-TALK (toll free, 24 hour hotline) call the [facility]: 4242913638 call 911 if you are experiencing a Mental Health or Behavioral Health Crisis or need someone to talk to.  Grandville Lax, BSN RN [facility], [facility] Health RN Care Manager Direct Dial : [phone]  Fax: [phone]

## 2023-10-30 NOTE — Patient Outreach (Signed)
 Complex Care Management   Visit Note  10/30/2023  Name:  Dawn Anthony MRN: [id number] DOB: 04/24/1951  Situation: Referral received for Complex Care Management related to Diabetes with Complications I obtained verbal consent from Patient.  Visit completed with patient  on the phone  Background:   Past Medical History:  Diagnosis Date   Diabetes mellitus without complication (HCC)    Gastritis    Hyperlipidemia    Hypertension    Thyroid  disease     Assessment: Patient Reported Symptoms:  Cognitive Cognitive Status: Alert and oriented to person, place, and time Cognitive/Intellectual Conditions Management [RPT]: None reported or documented in medical history or problem list   Health Maintenance Behaviors: Annual physical exam Healing Pattern: Average Health Facilitated by: Rest  Neurological Neurological Review of Symptoms: Dizziness Neurological Management Strategies: Routine screening Neurological Self-Management Outcome: 4 (good) Neurological Comment: Reports some dizziness due to moving furniture and stated "I was doing too much"  HEENT HEENT Symptoms Reported: No symptoms reported      Cardiovascular Cardiovascular Symptoms Reported: No symptoms reported    Respiratory Respiratory Symptoms Reported: No symptoms reported    Endocrine Patient reports the following symptoms related to hypoglycemia or hyperglycemia : No symptoms reported Is patient diabetic?: Yes Is patient checking blood sugars at home?: Yes Endocrine Conditions: Diabetes, Thyroid  disorder Endocrine Management Strategies: Routine screening, Medication therapy Endocrine Self-Management Outcome: 4 (good)  Gastrointestinal Gastrointestinal Symptoms Reported: No symptoms reported   Nutrition Risk Screen (CP): No indicators present  Genitourinary Genitourinary Symptoms Reported: No symptoms reported    Integumentary Integumentary Symptoms Reported: No symptoms reported    Musculoskeletal  Musculoskelatal Symptoms Reviewed: No symptoms reported   Falls in the past year?: Yes Number of falls in past year: 1 or less Was there an injury with Fall?: No Fall Risk Category Calculator: 1 Patient Fall Risk Level: Low Fall Risk Patient at Risk for Falls Due to: History of fall(s) Fall risk Follow up: Falls evaluation completed, Falls prevention discussed  Psychosocial Psychosocial Symptoms Reported: No symptoms reported Behavioral Management Strategies: Support system Major Change/Loss/Stressor/Fears (CP): Denies Techniques to Cope with Loss/Stress/Change: Not applicable Quality of Family Relationships: helpful, involved, supportive Do you feel physically threatened by others?: No      10/30/2023    1:56 PM  Depression screen PHQ 2/9  Decreased Interest 0  Down, Depressed, Hopeless 0  PHQ - 2 Score 0    There were no vitals filed for this visit.  Medications Reviewed Today     Reviewed by Remona Carmel, RN (Registered Nurse) on 10/30/23 at 1344  Med List Status: <None>   Medication Order Taking? Sig Documenting Provider Last Dose Status Informant  aspirin EC 81 MG tablet [id number] Yes Take 81 mg by mouth daily. Swallow whole. [provider] Taking Active   Calcium-Magnesium-Vitamin D  (CALCIUM 1200+D3 PO) [id number] Yes Take 1 tablet by mouth daily. [provider] Taking Active Self  carvedilol  (COREG ) 12.5 MG tablet [id number] Yes Take 1 tablet (12.5 mg total) by mouth 2 (two) times daily with a meal. Rakes, Georgeann Kindred, FNP Taking Active   cholecalciferol  (VITAMIN D3) 25 MCG (1000 UT) tablet [id number]  Take 50 Units by mouth daily.  Patient not taking: Reported on 10/01/2023   [provider]  Active Self  Continuous Glucose Sensor (FREESTYLE LIBRE 3 PLUS SENSOR) MISC [id number] Yes Change sensor every 15 days. Wendel Hals, NP Taking Active   insulin  degludec (TRESIBA  FLEXTOUCH) 100 UNIT/ML FlexTouch Pen [id number] Yes Inject 25  Units into the  skin at bedtime. Nida, Gebreselassie W, MD Taking Active   insulin  lispro (HUMALOG  KWIKPEN) 100 UNIT/ML KwikPen [id number] Yes Inject 8-14 Units into the skin 3 (three) times daily. Wendel Hals, NP Taking Active   Insulin  Pen Needle (BD PEN NEEDLE NANO U/F) 32G X 4 MM MISC [id number] Yes UAD QID Dx E11.69 Wendel Hals, NP Taking Active   levothyroxine  (SYNTHROID ) 75 MCG tablet [id number] Yes TAKE 1 TABLET BY MOUTH DAILY BEFORE BREAKFAST. Wendel Hals, NP Taking Active   lisinopril  (ZESTRIL ) 5 MG tablet [id number] Yes TAKE 1 TABLET (5 MG TOTAL) BY MOUTH DAILY. Galvin Jules, FNP Taking Active   metFORMIN  (GLUCOPHAGE ) 1000 MG tablet [id number] Yes Take 1 tablet (1,000 mg total) by mouth 2 (two) times daily with a meal. Wendel Hals, NP Taking Active   rosuvastatin (CRESTOR) 20 MG tablet [id number] Yes Take 20 mg by mouth daily. [provider] Taking Active             Recommendation:   PCP Follow-up  Follow Up Plan:   Telephone follow up appointment date/time:  11-27-2023 at 1:00 pm  [place] Lax, BSN RN [facility], [facility] RN Care Manager Direct Dial : [phone]  Fax: [phone]

## 2023-11-06 DIAGNOSIS — H25813 Combined forms of age-related cataract, bilateral: Secondary | ICD-10-CM | POA: Diagnosis not present

## 2023-11-10 NOTE — Progress Notes (Addendum)
 Cardiology Office Note:   Date:  11/12/2023  ID:  Dawn Anthony, DOB [birth year], MRN [id number] PCP: Galvin Jules, FNP  [facility] Providers Cardiologist:  None {  History of Present Illness:   Dawn Anthony is a 73 y.o. female who presents for evaluation of CAD.  She has had three vessel CABG with LIMA to LAD, and reverse SVG to first diagonal and SVG to posterior lateral branch. Echocardiogram on 05/14/2021 which showed normal EF with no hemodynamically significant valve disease.  I was able to look back from her records.  Looks like she had a non-STEMI.  She had a well-preserved ejection fraction.  She subsequently had a LIMA to the LAD, SVG to D1 and SVG to posterolateral branch.    Since I last saw her she has had some occasional chest discomfort in her mid upper chest.  This may or may not be similar to her previous angina but is very sporadic.  She is really not having reproducible symptoms similar to prior to her bypass.  She was able actually to move from an apartment to a mobile home recently which made going up and down a few stairs and carrying boxes without significant limitations.  She did not have excessive shortness of breath and could not reproduce that chest discomfort.  She has not had any new PND or orthopnea.  She has had no new palpitations, presyncope or syncope.   ROS: As stated in the HPI and negative for all other systems.  Studies Reviewed:    EKG:   EKG Interpretation Date/Time:  Wednesday Nov 12 2023 09:11:35 EDT Ventricular Rate:  79 PR Interval:  152 QRS Duration:  114 QT Interval:  422 QTC Calculation: 483 R Axis:   -53  Text Interpretation: Normal sinus rhythm Right bundle branch block Left anterior fascicular block Bifascicular block Minimal voltage criteria for LVH, may be normal variant ( R in aVL ) When compared with ECG of 09-Jul-2019 14:23, left axis sift is now evident and QRS has widened Confirmed by Eilleen Grates ([id number]) on  11/12/2023 9:21:15 AM    Risk Assessment/Calculations:         Physical Exam:   VS:  BP (!) 166/96   Pulse 79   Ht 5\' 1"  (1.549 m)   Wt 144 lb (65.3 kg)   SpO2 97%   BMI 27.21 kg/m    Wt Readings from Last 3 Encounters:  11/12/23 144 lb (65.3 kg)  10/01/23 147 lb (66.7 kg)  09/16/23 147 lb 12.8 oz (67 kg)     GEN: Well nourished, well developed in no acute distress NECK: No JVD; No carotid bruits CARDIAC: RRR, no murmurs, rubs, gallops RESPIRATORY:  Clear to auscultation without rales, wheezing or rhonchi  ABDOMEN: Soft, non-tender, non-distended EXTREMITIES:  No edema; No deformity   ASSESSMENT AND PLAN:   CABG:   The patient has no new sypmtoms.  No further cardiovascular testing is indicated.  We will continue with aggressive risk reduction and meds as listed.   Dyslipidemia: LDL was 37 earlier this year.  No change in therapy.  HTN: Her blood pressure is not at target.  I am going to increase her lisinopril  to 10 mg daily.  She needs to get a blood pressure cuff and blood pressure diary   DM: A1c is now up to 10.7 from 9.8.  We had a long conversation with this.  I asked her to go to day to make a phone call to schedule with  her endocrinologist.  She needs to get this controlled.  We discussed the importance.   Preop: She is going to have cataract surgery.  She has no high risk findings.  There are no cardiac contraindications to this procedure and she would not need further cardiovascular testing according to ACC/AHA guidelines.  We do need to be aware of her uncontrolled diabetes.  Abnormal EKG: She does have bifascicular block which is a little bit different than previous but she has no symptoms related to this.  She can let us  know if she has any presyncope or syncope in the future     Follow up with me in 1 year  Signed, Eilleen Grates, MD

## 2023-11-12 ENCOUNTER — Encounter: Payer: Self-pay | Admitting: Cardiology

## 2023-11-12 ENCOUNTER — Ambulatory Visit (INDEPENDENT_AMBULATORY_CARE_PROVIDER_SITE_OTHER): Payer: HMO | Admitting: Cardiology

## 2023-11-12 ENCOUNTER — Telehealth: Payer: Self-pay | Admitting: *Deleted

## 2023-11-12 VITALS — BP 166/96 | HR 79 | Ht 61.0 in | Wt 144.0 lb

## 2023-11-12 DIAGNOSIS — E785 Hyperlipidemia, unspecified: Secondary | ICD-10-CM | POA: Diagnosis not present

## 2023-11-12 DIAGNOSIS — Z951 Presence of aortocoronary bypass graft: Secondary | ICD-10-CM

## 2023-11-12 DIAGNOSIS — E118 Type 2 diabetes mellitus with unspecified complications: Secondary | ICD-10-CM | POA: Diagnosis not present

## 2023-11-12 DIAGNOSIS — I10 Essential (primary) hypertension: Secondary | ICD-10-CM

## 2023-11-12 MED ORDER — LISINOPRIL 10 MG PO TABS: 10.0000 mg | ORAL_TABLET | Freq: Every day | ORAL | 3 refills | Status: DC

## 2023-11-12 NOTE — Telephone Encounter (Signed)
 Patient left a voicemail that she had seen her Cardiologist and that her diabetes were worse thane ever. Her A1C was over 10. The Cardiologist ask her to see if she could get seen here sooner than her appointment, 12/24/2023. Patient also mentioned that she is to have Cataract surgery in June and she does not want her sugars to mess that up.

## 2023-11-12 NOTE — Telephone Encounter (Signed)
 Ok, let me know what you learn.  We may certainly need more insulin .

## 2023-11-12 NOTE — Patient Instructions (Signed)
 Medication Instructions:  Please increase Lisinopril  to 10 mg a day. Continue all other medications as listed.  *If you need a refill on your cardiac medications before your next appointment, please call your pharmacy*  Follow-Up: At [facility], you and your health needs are our priority.  As part of our continuing mission to provide you with exceptional heart care, our providers are all part of one team.  This team includes your primary Cardiologist (physician) and Advanced Practice Providers or APPs (Physician Assistants and Nurse Practitioners) who all work together to provide you with the care you need, when you need it.  Your next appointment:   1 year(s)  Provider:   Eilleen Grates, MD    We recommend signing up for the patient portal called "MyChart".  Sign up information is provided on this After Visit Summary.  MyChart is used to connect with patients for Virtual Visits (Telemedicine).  Patients are able to view lab/test results, encounter notes, upcoming appointments, etc.  Non-urgent messages can be sent to your provider as well.   To learn more about what you can do with MyChart, go to [email or web address].

## 2023-11-12 NOTE — Telephone Encounter (Signed)
 Patient called and a message was left asking her to call our office back. We need to get several days of her blood sugar readings.

## 2023-11-27 ENCOUNTER — Telehealth: Payer: Self-pay | Admitting: *Deleted

## 2023-11-27 DIAGNOSIS — E039 Hypothyroidism, unspecified: Secondary | ICD-10-CM | POA: Diagnosis not present

## 2023-11-27 DIAGNOSIS — H25812 Combined forms of age-related cataract, left eye: Secondary | ICD-10-CM | POA: Diagnosis not present

## 2023-11-27 DIAGNOSIS — E1136 Type 2 diabetes mellitus with diabetic cataract: Secondary | ICD-10-CM | POA: Diagnosis not present

## 2023-11-28 ENCOUNTER — Other Ambulatory Visit: Payer: Self-pay | Admitting: *Deleted

## 2023-11-28 VITALS — BP 128/78 | HR 71

## 2023-11-28 DIAGNOSIS — Z5982 Transportation insecurity: Secondary | ICD-10-CM

## 2023-11-28 NOTE — Patient Instructions (Signed)
 Visit Information  Thank you for taking time to visit with me today. Please don't hesitate to contact me if I can be of assistance to you before our next scheduled appointment.  Your next care management appointment is by telephone on 12/29/2023 at 10:30 am  Telephone follow-up in 1 month  Please call the care guide team at [phone] if you need to cancel, schedule, or reschedule an appointment.   Please call the Suicide and Crisis Lifeline: 988 call the [place]  National Suicide Prevention Lifeline: 682 149 9761 or TTY: 219-234-2239 TTY 708-637-1679) to talk to a trained counselor call 1-800-273-TALK (toll free, 24 hour hotline) call the [facility]: (478)420-6509 call 911 if you are experiencing a Mental Health or Behavioral Health Crisis or need someone to talk to.  Gilberto Labella, MSN, RN [facility]  [facility], [facility] RN Care Manager Direct Dial : [phone] Fax: [phone]

## 2023-11-28 NOTE — Patient Outreach (Signed)
 Complex Care Management   Visit Note  11/28/2023  Name:  Dawn Anthony MRN: [id number] DOB: [birth year]  Situation: Referral received for Complex Care Management related to Diabetes with Complications I obtained verbal consent from Patient.  Visit completed with patient  on the phone  Background:   Past Medical History:  Diagnosis Date   Diabetes mellitus without complication (HCC)    Gastritis    Hyperlipidemia    Hypertension    Thyroid  disease     Assessment: Patient Reported Symptoms:  Cognitive Cognitive Status: Alert and oriented to person, place, and time, Insightful and able to interpret abstract concepts   Health Maintenance Behaviors: Annual physical exam, Healthy diet, Sleep adequate Healing Pattern: Average Health Facilitated by: Rest, Healthy diet  Neurological Neurological Review of Symptoms: Dizziness (Discussed standing slowly and waiting before moving) Neurological Management Strategies: Routine screening Neurological Self-Management Outcome: 4 (good)  HEENT HEENT Symptoms Reported: No symptoms reported HEENT Conditions: Vision problem(s) Vision Problems: cataract(s) HEENT Management Strategies: Routine screening HEENT Self-Management Outcome: 4 (good) HEENT Comment: Had cataract surgery 11/27/23 on Left, soing well, Right scheduled for 12/11/23, limited vision on right Vision problem(s)  Cardiovascular Cardiovascular Symptoms Reported: No symptoms reported Cardiovascular Conditions: Hypertension, High blood cholesterol Cardiovascular Management Strategies: Medication therapy, Routine screening Cardiovascular Self-Management Outcome: 4 (good)  Respiratory Respiratory Symptoms Reported: No symptoms reported    Endocrine Patient reports the following symptoms related to hypoglycemia or hyperglycemia : Sweating (One episode hypoglycemia during night after skipping evening snack. Able to report back how to treat.) Is patient diabetic?: Yes Is patient checking  blood sugars at home?: Yes Endocrine Conditions: Diabetes, Thyroid  disorder Endocrine Management Strategies: Medication therapy, Routine screening Endocrine Self-Management Outcome: 4 (good)  Gastrointestinal Gastrointestinal Symptoms Reported: No symptoms reported      Genitourinary Genitourinary Symptoms Reported: Urgency, Incontinence Additional Genitourinary Details: Patient reports in last 4-5 months urgency and if waits will be incontinent Genitourinary Conditions: Incontinence Genitourinary Self-Management Outcome: 3 (uncertain) Genitourinary Comment: Aware to discuss with PCP next visit  Integumentary Integumentary Symptoms Reported: No symptoms reported    Musculoskeletal Musculoskelatal Symptoms Reviewed: No symptoms reported Additional Musculoskeletal Details: Occasional knee discomfort but not lately Musculoskeletal Management Strategies: Adequate rest Musculoskeletal Self-Management Outcome: 4 (good) Falls in the past year?: Yes (Patient reports fell last month while moving due to equipment use) Was there an injury with Fall?: No Patient at Risk for Falls Due to: History of fall(s) Fall risk Follow up: Falls evaluation completed  Psychosocial Psychosocial Symptoms Reported: No symptoms reported Behavioral Management Strategies: Adequate rest, Support system Behavioral Health Self-Management Outcome: 4 (good) Major Change/Loss/Stressor/Fears (CP): Denies Techniques to Cope with Loss/Stress/Change: Not applicable Quality of Family Relationships: helpful, involved, supportive Do you feel physically threatened by others?: No      11/28/2023   11:07 AM  Depression screen PHQ 2/9  Decreased Interest 0  Down, Depressed, Hopeless 0  PHQ - 2 Score 0    Vitals:   11/28/23 1041  BP: 128/78  Pulse: 71    Medications Reviewed Today     Reviewed by Gilberto Labella, RN (Registered Nurse) on 11/28/23 at 1050  Med List Status: <None>   Medication Order Taking? Sig  Documenting Provider Last Dose Status Informant  aspirin EC 81 MG tablet [id number] Yes Take 81 mg by mouth daily. Swallow whole. [provider] Taking Active   Calcium-Magnesium-Vitamin D  (CALCIUM 1200+D3 PO) [id number] Yes Take 1 tablet by mouth daily. [provider] Taking Active Self  carvedilol  (COREG ) 12.5 MG  tablet [id number] Yes Take 1 tablet (12.5 mg total) by mouth 2 (two) times daily with a meal. Rakes, Georgeann Kindred, FNP Taking Active   cholecalciferol  (VITAMIN D3) 25 MCG (1000 UT) tablet [id number] No Take 50 Units by mouth daily.  Patient not taking: Reported on 10/01/2023   [provider] Not Taking Active Self  Continuous Glucose Sensor (FREESTYLE LIBRE 3 PLUS SENSOR) MISC [id number] Yes Change sensor every 15 days. Wendel Hals, NP Taking Active   insulin  degludec (TRESIBA  FLEXTOUCH) 100 UNIT/ML FlexTouch Pen [id number] Yes Inject 25 Units into the skin at bedtime. Nida, Gebreselassie W, MD Taking Active   insulin  lispro (HUMALOG  KWIKPEN) 100 UNIT/ML KwikPen [id number] Yes Inject 8-14 Units into the skin 3 (three) times daily. Wendel Hals, NP Taking Active   Insulin  Pen Needle (BD PEN NEEDLE NANO U/F) 32G X 4 MM MISC [id number] Yes UAD QID Dx E11.69 Wendel Hals, NP Taking Active   levothyroxine  (SYNTHROID ) 75 MCG tablet [id number] Yes TAKE 1 TABLET BY MOUTH DAILY BEFORE BREAKFAST. Wendel Hals, NP Taking Active   lisinopril  (ZESTRIL ) 10 MG tablet [id number] Yes Take 1 tablet (10 mg total) by mouth daily. Eilleen Grates, MD Taking Active   metFORMIN  (GLUCOPHAGE ) 1000 MG tablet [id number] Yes Take 1 tablet (1,000 mg total) by mouth 2 (two) times daily with a meal. Wendel Hals, NP Taking Active   methocarbamol (ROBAXIN) 500 MG tablet [id number] No Take 500 mg by mouth every 8 (eight) hours as needed.  Patient not taking: Reported on 11/28/2023   [provider] Not Taking Active   rosuvastatin (CRESTOR) 20 MG tablet [id number] Yes Take  20 mg by mouth daily. [provider] Taking Active             Recommendation:   PCP Follow-up Continue Current Plan of Care  Follow Up Plan:   Telephone follow-up in 1 month Referral to BSW  Gilberto Labella, MSN, RN [facility]  [facility], [facility] RN Care Manager Direct Dial : [phone] Fax: [phone]

## 2023-12-05 ENCOUNTER — Other Ambulatory Visit: Payer: Self-pay

## 2023-12-05 NOTE — Patient Outreach (Signed)
 Complex Care Management   Visit Note  12/05/2023  Name:  Dawn Anthony MRN: [id number] DOB: 02-25-1951  Situation: Referral received for Complex Care Management related to SDOH Barriers:  Transportation Incontinence supplies I obtained verbal consent from Patient.  Visit completed with patient  on the phone  Background:   Past Medical History:  Diagnosis Date   Diabetes mellitus without complication (HCC)    Gastritis    Hyperlipidemia    Hypertension    Thyroid  disease     Assessment:  Patient reports she can no longer drive and her grandson has to take off work to provide transportation to appointments. Patient also has to purchase incontinence supplies and is looking for assist with insurance to cover the cost.  SW t/c [employer] and patient is eligible for 48 1-way rides and $70 OTC benefits for incontinence supplies.  Patient is provided [employer] [phone] contact number to schedule.  Patient is grateful and does not request a follow up.  SDOH Interventions    Flowsheet Row Patient Outreach Telephone from 12/05/2023 in [place] POPULATION HEALTH DEPARTMENT Patient Outreach from 10/30/2023 in [place] POPULATION HEALTH DEPARTMENT Clinical Support from 10/01/2023 in [facility] Patient Outreach from 08/19/2023 in [place] POPULATION HEALTH DEPARTMENT Office Visit from 12/31/2022 in [facility] Clinical Support from 09/30/2022 in [facility]  SDOH Interventions        Food Insecurity Interventions Intervention Not Indicated Intervention Not Indicated Intervention Not Indicated -- -- Intervention Not Indicated  Housing Interventions Intervention Not Indicated Intervention Not Indicated Intervention Not Indicated -- -- Intervention Not Indicated  Transportation Interventions Intervention Not Indicated Intervention Not Indicated Intervention Not Indicated -- --  Intervention Not Indicated  Utilities Interventions Intervention Not Indicated Intervention Not Indicated Intervention Not Indicated -- -- Intervention Not Indicated  Alcohol Usage Interventions -- -- Intervention Not Indicated (Score <7) -- -- Intervention Not Indicated (Score <7)  Depression Interventions/Treatment  -- -- -- -- PHQ2-9 Score <4 Follow-up Not Indicated --  Financial Strain Interventions Intervention Not Indicated -- Intervention Not Indicated -- -- Intervention Not Indicated  Physical Activity Interventions -- -- Intervention Not Indicated Other (Comments), Intervention Not Indicated  [using treadmill twice weekly] -- Intervention Not Indicated  Stress Interventions -- -- Intervention Not Indicated -- -- Intervention Not Indicated  Social Connections Interventions -- -- Intervention Not Indicated -- -- Intervention Not Indicated  Health Literacy Interventions -- -- Intervention Not Indicated Intervention Not Indicated -- --      Recommendation:   none  Follow Up Plan:   Patient has met all care management goals. Care Management case will be closed. Patient has been provided contact information should new needs arise.   Dallis Dues, BSW [place]  [facility], [facility] Social Worker Direct Dial : [phone]  Fax: [phone] Website: [email or web address]

## 2023-12-05 NOTE — Patient Instructions (Signed)
 Visit Information  Thank you for taking time to visit with me today. Please don't hesitate to contact me if I can be of assistance to you before our next scheduled appointment.  Following is a copy of your care plan:   Goals Addressed             This Visit's Progress    COMPLETED: BSW-VBCI Social Work Care Plan       Problems:   Transportation and Incontinence supplies  CSW Clinical Goal(s):   Over the next 1 year the Patient will work with [employer] to address needs related to transportation and incontinence supplies.  Interventions:  Social Determinants of Health in Patient with DMII: SDOH assessments completed: Transportation and Incontinence supplies Evaluation of current treatment plan related to unmet needs SW referred patient to use [employer] Safe Ride for transportation and $70 OTC for incontinence supplies.  Patient Goals/Self-Care Activities:  Patient will contact Safe Ride and use insurance OTC for incontinence supplies.  Plan:   No further follow up required: Patient does not request a follow up        Please call 911 if you are experiencing a Mental Health or Behavioral Health Crisis or need someone to talk to.  Patient verbalizes understanding of instructions and care plan provided today and agrees to view in MyChart. Active MyChart status and patient understanding of how to access instructions and care plan via MyChart confirmed with patient.     Dallis Dues, BSW [place]  [facility], [facility] Social Worker Direct Dial : [phone]  Fax: [phone] Website: [email or web address]

## 2023-12-11 DIAGNOSIS — E1136 Type 2 diabetes mellitus with diabetic cataract: Secondary | ICD-10-CM | POA: Diagnosis not present

## 2023-12-11 DIAGNOSIS — I10 Essential (primary) hypertension: Secondary | ICD-10-CM | POA: Diagnosis not present

## 2023-12-11 DIAGNOSIS — I25810 Atherosclerosis of coronary artery bypass graft(s) without angina pectoris: Secondary | ICD-10-CM | POA: Diagnosis not present

## 2023-12-11 DIAGNOSIS — H25811 Combined forms of age-related cataract, right eye: Secondary | ICD-10-CM | POA: Diagnosis not present

## 2023-12-24 ENCOUNTER — Encounter: Payer: Self-pay | Admitting: Nurse Practitioner

## 2023-12-24 ENCOUNTER — Ambulatory Visit: Admitting: Nurse Practitioner

## 2023-12-24 ENCOUNTER — Other Ambulatory Visit: Payer: Self-pay | Admitting: Family Medicine

## 2023-12-24 VITALS — BP 128/80 | HR 81 | Ht 61.0 in | Wt 146.0 lb

## 2023-12-24 DIAGNOSIS — E890 Postprocedural hypothyroidism: Secondary | ICD-10-CM | POA: Diagnosis not present

## 2023-12-24 DIAGNOSIS — Z794 Long term (current) use of insulin: Secondary | ICD-10-CM | POA: Diagnosis not present

## 2023-12-24 DIAGNOSIS — Z7984 Long term (current) use of oral hypoglycemic drugs: Secondary | ICD-10-CM

## 2023-12-24 DIAGNOSIS — E782 Mixed hyperlipidemia: Secondary | ICD-10-CM

## 2023-12-24 DIAGNOSIS — I10 Essential (primary) hypertension: Secondary | ICD-10-CM | POA: Diagnosis not present

## 2023-12-24 DIAGNOSIS — E1165 Type 2 diabetes mellitus with hyperglycemia: Secondary | ICD-10-CM | POA: Diagnosis not present

## 2023-12-24 LAB — POCT UA - MICROALBUMIN

## 2023-12-24 LAB — POCT GLYCOSYLATED HEMOGLOBIN (HGB A1C): Hemoglobin A1C: 10 % — AB (ref 4.0–5.6)

## 2023-12-24 MED ORDER — LEVOTHYROXINE SODIUM 75 MCG PO TABS: 75.0000 ug | ORAL_TABLET | Freq: Every day | ORAL | 1 refills | Status: DC

## 2023-12-24 MED ORDER — BD PEN NEEDLE NANO U/F 32G X 4 MM MISC: 5 refills | Status: AC

## 2023-12-24 MED ORDER — METFORMIN HCL 1000 MG PO TABS: 1000.0000 mg | ORAL_TABLET | Freq: Two times a day (BID) | ORAL | 3 refills | Status: DC

## 2023-12-24 MED ORDER — FREESTYLE LIBRE 3 PLUS SENSOR MISC: 3 refills | Status: DC

## 2023-12-24 MED ORDER — TRESIBA FLEXTOUCH 100 UNIT/ML ~~LOC~~ SOPN: 25.0000 [IU] | PEN_INJECTOR | Freq: Every day | SUBCUTANEOUS | 3 refills | Status: DC

## 2023-12-24 MED ORDER — INSULIN LISPRO (1 UNIT DIAL) 100 UNIT/ML (KWIKPEN): 8.0000 [IU] | PEN_INJECTOR | Freq: Three times a day (TID) | SUBCUTANEOUS | 3 refills | Status: DC

## 2023-12-24 NOTE — Progress Notes (Signed)
 Endocrinology Follow Up Note       12/24/2023, 3:15 PM   Subjective:    Patient ID: Dawn Anthony, female    DOB: 07/23/1950.  Dawn Anthony is being seen in follow up after being seen in consultation for management of currently uncontrolled symptomatic diabetes requested by  Severa Rock HERO, FNP.   Past Medical History:  Diagnosis Date   Diabetes mellitus without complication (HCC)    Gastritis    Hyperlipidemia    Hypertension    Thyroid  disease     Past Surgical History:  Procedure Laterality Date   CORONARY ARTERY BYPASS GRAFT  2022   EYE SURGERY     KNEE SURGERY Bilateral    OTHER SURGICAL HISTORY     shoulder surgery   THYROID  SURGERY      Social History   Socioeconomic History   Marital status: Widowed    Spouse name: Not on file   Number of children: 1   Years of education: Not on file   Highest education level: 12th grade  Occupational History   Not on file  Tobacco Use   Smoking status: Never   Smokeless tobacco: Never  Vaping Use   Vaping status: Never Used  Substance and Sexual Activity   Alcohol use: Never   Drug use: Never   Sexual activity: Not Currently  Other Topics Concern   Not on file  Social History Narrative   Grandson lives with her.    Social Drivers of Corporate investment banker Strain: Low Risk  (12/05/2023)   Overall Financial Resource Strain (CARDIA)    Difficulty of Paying Living Expenses: Not hard at all  Food Insecurity: No Food Insecurity (12/05/2023)   Hunger Vital Sign    Worried About Running Out of Food in the Last Year: Never true    Ran Out of Food in the Last Year: Never true  Transportation Needs: No Transportation Needs (12/05/2023)   PRAPARE - Administrator, Civil Service (Medical): No    Lack of Transportation (Non-Medical): No  Physical Activity: Insufficiently Active (10/01/2023)   Exercise Vital Sign    Days of Exercise  per Week: 3 days    Minutes of Exercise per Session: 30 min  Stress: No Stress Concern Present (10/01/2023)   [employer] of Occupational Health - Occupational Stress Questionnaire    Feeling of Stress : Not at all  Social Connections: Moderately Isolated (10/01/2023)   Social Connection and Isolation Panel    Frequency of Communication with Friends and Family: More than three times a week    Frequency of Social Gatherings with Friends and Family: More than three times a week    Attends Religious Services: More than 4 times per year    Active Member of [employer] or Organizations: No    Attends Banker Meetings: Never    Marital Status: Widowed    Family History  Problem Relation Age of Onset   Diabetes Mother    Heart disease Mother    Heart failure Mother    Heart attack Father    Heart disease Father    COPD Father    Diabetes Father  Dementia Sister    Heart attack Sister    Hypertension Sister    Diabetes Brother    Heart attack Brother    Alcohol abuse Brother    Cervical cancer Maternal Grandmother    Diabetes Paternal Grandfather     Outpatient Encounter Medications as of 12/24/2023  Medication Sig   aspirin EC 81 MG tablet Take 81 mg by mouth daily. Swallow whole.   Calcium-Magnesium-Vitamin D  (CALCIUM 1200+D3 PO) Take 1 tablet by mouth daily.   carvedilol  (COREG ) 12.5 MG tablet TAKE 1 TABLET (12.5MG  TOTAL) BY MOUTH TWICE A DAY WITH MEALS   lisinopril  (ZESTRIL ) 10 MG tablet Take 1 tablet (10 mg total) by mouth daily.   rosuvastatin (CRESTOR) 20 MG tablet Take 20 mg by mouth daily.   [DISCONTINUED] Continuous Glucose Sensor (FREESTYLE LIBRE 3 PLUS SENSOR) MISC Change sensor every 15 days.   [DISCONTINUED] insulin  degludec (TRESIBA  FLEXTOUCH) 100 UNIT/ML FlexTouch Pen Inject 25 Units into the skin at bedtime.   [DISCONTINUED] insulin  lispro (HUMALOG  KWIKPEN) 100 UNIT/ML KwikPen Inject 8-14 Units into the skin 3 (three) times daily.   [DISCONTINUED] Insulin   Pen Needle (BD PEN NEEDLE NANO U/F) 32G X 4 MM MISC UAD QID Dx E11.69   [DISCONTINUED] levothyroxine  (SYNTHROID ) 75 MCG tablet TAKE 1 TABLET BY MOUTH DAILY BEFORE BREAKFAST.   [DISCONTINUED] metFORMIN  (GLUCOPHAGE ) 1000 MG tablet Take 1 tablet (1,000 mg total) by mouth 2 (two) times daily with a meal.   cholecalciferol  (VITAMIN D3) 25 MCG (1000 UT) tablet Take 50 Units by mouth daily. (Patient not taking: Reported on 12/24/2023)   Continuous Glucose Sensor (FREESTYLE LIBRE 3 PLUS SENSOR) MISC Change sensor every 15 days.   insulin  degludec (TRESIBA  FLEXTOUCH) 100 UNIT/ML FlexTouch Pen Inject 25 Units into the skin at bedtime.   insulin  lispro (HUMALOG  KWIKPEN) 100 UNIT/ML KwikPen Inject 8-14 Units into the skin 3 (three) times daily.   Insulin  Pen Needle (BD PEN NEEDLE NANO U/F) 32G X 4 MM MISC UAD QID Dx E11.69   levothyroxine  (SYNTHROID ) 75 MCG tablet Take 1 tablet (75 mcg total) by mouth daily before breakfast.   metFORMIN  (GLUCOPHAGE ) 1000 MG tablet Take 1 tablet (1,000 mg total) by mouth 2 (two) times daily with a meal.   methocarbamol (ROBAXIN) 500 MG tablet Take 500 mg by mouth every 8 (eight) hours as needed. (Patient not taking: Reported on 11/28/2023)   [DISCONTINUED] carvedilol  (COREG ) 12.5 MG tablet Take 1 tablet (12.5 mg total) by mouth 2 (two) times daily with a meal.   No facility-administered encounter medications on file as of 12/24/2023.    ALLERGIES: Allergies  Allergen Reactions   Hydrochlorothiazide     Other Reaction(s): Unknown-Unspecified  Stopped due to pancreatitis   Insulin  Glargine-Lixisenatide Nausea And Vomiting   Simvastatin     Other Reaction(s): Unknown-Unspecified  Stopped due to pancreatitis  Stopped due to pancreatitis   Penicillins Hives   Amoxicillin Itching and Rash   Cat Dander Itching and Rash   Latex Other (See Comments) and Rash    VACCINATION STATUS: Immunization History  Administered Date(s) Administered   Fluad Trivalent(High Dose 65+)  07/03/2023   Influenza Split 04/24/2013, 03/07/2017, 03/27/2018   Influenza, High Dose Seasonal PF 03/08/2021, 03/08/2021   Influenza, Quadrivalent, Recombinant, Inj, Pf 03/08/2021   Influenza, Seasonal, Injecte, Preservative Fre 04/27/2015, 02/28/2017   Influenza,inj,Quad PF,6+ Mos 04/02/2020   Influenza,trivalent, recombinat, inj, PF 04/24/2013, 03/07/2017, 03/27/2018   Influenza-Unspecified 04/27/2015, 02/28/2017, 03/08/2021   Moderna Sars-Covid-2 Vaccination 11/01/2019, 12/09/2019   PFIZER(Purple Top)SARS-COV-2 Vaccination  06/13/2020   Pneumococcal Conjugate-13 04/07/2018   Pneumococcal Polysaccharide-23 11/21/2007, 10/25/2019   Tdap 12/10/2006, 04/24/2017   Zoster, Live 03/22/2013    Diabetes She presents for her follow-up diabetic visit. Onset time: diagnosed at approx age of 87. Her disease course has been improving. There are no hypoglycemic associated symptoms. There are no diabetic associated symptoms. There are no hypoglycemic complications. 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 Risk factors for coronary artery disease include diabetes mellitus, dyslipidemia, family history, hypertension, sedentary lifestyle and post-menopausal. Current diabetic treatment includes intensive insulin  program and oral agent (monotherapy). She is compliant with treatment most of the time. Her weight is fluctuating minimally. She is following a generally healthy diet. When asked about meal planning, she reported none. She has not had a previous visit with a dietitian. She participates in exercise intermittently. Her home blood glucose trend is decreasing steadily. Her overall blood glucose range is >200 mg/dl. (She presents today with her CGM improving glycemic profile overall.  Her POCT A1c today is 10%, improving from last visit of 10.7%.   Analysis of her CGM shows TIR 44%, TAR 55%, TBR 1% with a GMI of 8.2%.  She denies any significant hypoglycemia.  She does admit to eating  foods she knows she shouldn't, especially in the last week (had family come in from out of town and has been eating out more recently).) An ACE inhibitor/angiotensin II receptor blocker is being taken. She does not see a podiatrist.Eye exam is current.    Review of systems  Constitutional: +stable body weight,  current Body mass index is 27.59 kg/m. , no fatigue, no subjective hyperthermia, no subjective hypothermia Eyes: no blurry vision, no xerophthalmia ENT: no sore throat, no nodules palpated in throat, no dysphagia/odynophagia, no hoarseness Cardiovascular: no chest pain, no shortness of breath, no palpitations, no leg swelling Respiratory: no cough, no shortness of breath Gastrointestinal: no nausea/vomiting/diarrhea Musculoskeletal: no muscle/joint aches Skin: no rashes, no hyperemia Neurological: no tremors, no numbness, no tingling, no dizziness Psychiatric: no depression, no anxiety  Objective:     BP 128/80 (BP Location: Left Arm, Patient Position: Sitting, Cuff Size: Large)   Pulse 81   Ht 5' 1 (1.549 m)   Wt 146 lb (66.2 kg)   BMI 27.59 kg/m   Wt Readings from Last 3 Encounters:  12/24/23 146 lb (66.2 kg)  11/12/23 144 lb (65.3 kg)  10/01/23 147 lb (66.7 kg)     BP Readings from Last 3 Encounters:  12/24/23 128/80  11/28/23 128/78  11/12/23 (!) 166/96      Physical Exam- Limited  Constitutional:  Body mass index is 27.59 kg/m. , not in acute distress, normal state of mind Eyes:  EOMI, no exophthalmos Musculoskeletal: no gross deformities, strength intact in all four extremities, no gross restriction of joint movements Skin:  no rashes, no hyperemia Neurological: no tremor with outstretched hands   Diabetic Foot Exam - Simple   Simple Foot Form Diabetic Foot exam was performed with the following findings: Yes 12/24/2023  2:46 PM  Visual Inspection See comments: Yes Sensation Testing Intact to touch and monofilament testing bilaterally: Yes Pulse  Check Posterior Tibialis and Dorsalis pulse intact bilaterally: Yes Comments Corn on right foot second toe, ingrown nail to great toes bilaterally     CMP ( most recent) CMP     Component Value Date/Time   NA 139 07/03/2023 0934   K 4.3 07/03/2023 0934   CL 103 07/03/2023 0934  CO2 22 07/03/2023 0934   GLUCOSE 210 (H) 07/03/2023 0934   GLUCOSE 405 (H) 06/28/2023 1620   BUN 22 07/03/2023 0934   CREATININE 0.76 07/03/2023 0934   CALCIUM 8.9 07/03/2023 0934   PROT 6.0 07/03/2023 0934   ALBUMIN 4.1 07/03/2023 0934   AST 18 07/03/2023 0934   ALT 13 07/03/2023 0934   ALKPHOS 59 07/03/2023 0934   BILITOT 0.3 07/03/2023 0934   GFRNONAA >60 06/28/2023 1620   GFRAA >60 07/09/2019 1456     Diabetic Labs (most recent): Lab Results  Component Value Date   HGBA1C 10.0 (A) 12/24/2023   HGBA1C 10.7 (H) 07/03/2023   HGBA1C 9.7 (A) 02/12/2023   MICROALBUR 80mg /L 12/24/2023     Lipid Panel ( most recent) Lipid Panel     Component Value Date/Time   CHOL 107 07/03/2023 0934   TRIG 98 07/03/2023 0934   HDL 52 07/03/2023 0934   CHOLHDL 2.1 07/03/2023 0934   LDLCALC 37 07/03/2023 0934   LABVLDL 18 07/03/2023 0934      Lab Results  Component Value Date   TSH 2.010 07/03/2023   TSH 1.080 02/05/2023   TSH 1.530 08/28/2022   FREET4 1.74 02/05/2023           Assessment & Plan:   1) Type 2 diabetes mellitus with hyperglycemia, with long-term current use of insulin   She presents today with her CGM improving glycemic profile overall.  Her POCT A1c today is 10%, improving from last visit of 10.7%.   Analysis of her CGM shows TIR 44%, TAR 55%, TBR 1% with a GMI of 8.2%.  She denies any significant hypoglycemia.  She does admit to eating foods she knows she shouldn't, especially in the last week (had family come in from out of town and has been eating out more recently).  - Dawn Anthony has currently uncontrolled symptomatic type 2 DM since 73 years of age.   -Recent labs  reviewed.  POCT UM shows mild microalbuminuria but recent CMP shows mild CKD.  - I had a long discussion with her about the progressive nature of diabetes and the pathology behind its complications. -her diabetes is complicated by CAD with CABG and she remains at a high risk for more acute and chronic complications which include CAD, CVA, CKD, retinopathy, and neuropathy. These are all discussed in detail with her.  The following Lifestyle Medicine recommendations according to American College of Lifestyle Medicine [facility]) were discussed and offered to patient and she agrees to start the journey:  A. Whole Foods, Plant-based plate comprising of fruits and vegetables, plant-based proteins, whole-grain carbohydrates was discussed in detail with the patient.   A list for source of those nutrients were also provided to the patient.  Patient will use only water or unsweetened tea for hydration. B.  The need to stay away from risky substances including alcohol, smoking; obtaining 7 to 9 hours of restorative sleep, at least 150 minutes of moderate intensity exercise weekly, the importance of healthy social connections,  and stress reduction techniques were discussed. C.  A full color page of  Calorie density of various food groups per pound showing examples of each food groups was provided to the patient.  - Nutritional counseling repeated at each appointment due to patients tendency to fall back in to old habits.  - The patient admits there is a room for improvement in their diet and drink choices. -  Suggestion is made for the patient to avoid simple carbohydrates from their  diet including Cakes, Sweet Desserts / Pastries, Ice Cream, Soda (diet and regular), Sweet Tea, Candies, Chips, Cookies, Sweet Pastries, Store Bought Juices, Alcohol in Excess of 1-2 drinks a day, Artificial Sweeteners, Coffee Creamer, and Sugar-free Products. This will help patient to have stable blood glucose profile and potentially  avoid unintended weight gain.   - I encouraged the patient to switch to unprocessed or minimally processed complex starch and increased protein intake (animal or plant source), fruits, and vegetables.   - Patient is advised to stick to a routine mealtimes to eat 3 meals a day and avoid unnecessary snacks (to snack only to correct hypoglycemia).  - I have approached her with the following individualized plan to manage her diabetes and patient agrees:   -Given her overall improvement, no changes will be made to her medication regimen today.  She is advised to continue Tresiba  25 units SQ nightly and Humalog  8-14 units TID with meals if glucose is above 90 and she is eating (Specific instructions on how to titrate insulin  dosage based on glucose readings given to patient in writing).   She can continue her Metformin  1000 mg po twice daily with meals.    -she is encouraged to start monitoring glucose 4 times daily (using her CGM), before meals and before bed, and to call the clinic if she has readings less than 70 or above 300 for 3 tests in a row.  - she is warned not to take insulin  without proper monitoring per orders. - Adjustment parameters are given to her for hypo and hyperglycemia in writing.  - her Jardiance was previously discontinued to de-escalate her treatment regimen.  - she is not ideal candidate for incretin therapy due to body habitus.   - Specific targets for  A1c; LDL, HDL, and Triglycerides were discussed with the patient.  2) Blood Pressure /Hypertension:  her blood pressure is controlled to target.   she is advised to continue her current medications including Coreg  12.5 mg po twice daily and Lisinopril  5 mg p.o. daily with breakfast.  3) Lipids/Hyperlipidemia:    Review of her recent lipid panel from 07/03/23 showed controlled LDL at 37 .  she is advised to continue Crestor 20 mg daily at bedtime.  Side effects and precautions discussed with her.  4)  Weight/Diet:  her  Body mass index is 27.59 kg/m.  -  she is NOT a candidate for weight loss.   Exercise, and detailed carbohydrates information provided  -  detailed on discharge instructions.  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    Her recent thyroid  labs are suggestive of appropriate hormone replacement.  She is currently on Levothyroxine  75 mcg po daily before breakfast she is advised to continue.  Will recheck prior to next visit and adjust dose accordingly.   - The correct intake of thyroid  hormone (Levothyroxine , Synthroid ), is on empty stomach first thing in the morning, with water, separated by at least 30 minutes from breakfast and other medications,  and separated by more than 4 hours from calcium, iron, multivitamins, acid reflux medications (PPIs).  - This medication is a life-long medication and will be needed to correct thyroid  hormone imbalances for the rest of your life.  The dose may change from time to time, based on thyroid  blood work.  - It is extremely important to be consistent taking this medication, near the same time each morning.  -AVOID TAKING PRODUCTS CONTAINING BIOTIN (commonly found in Hair,  Skin, Nails vitamins) AS IT INTERFERES WITH THE VALIDITY OF THYROID  FUNCTION BLOOD TESTS.  6) Chronic Care/Health Maintenance: -she is on ACEI/ARB and Statin medications and is encouraged to initiate and continue to follow up with Ophthalmology, Dentist, Podiatrist at least yearly or according to recommendations, and advised to stay away from smoking. I have recommended yearly flu vaccine and pneumonia vaccine at least every 5 years; moderate intensity exercise for up to 150 minutes weekly; and sleep for at least 7 hours a day.  - she is advised to maintain close follow up with Rakes, Rock HERO, FNP for primary care needs, as well as her other providers for optimal and coordinated care.     I spent  53  minutes in the care of the patient today  including review of labs from CMP, Lipids, Thyroid  Function, Hematology (current and previous including abstractions from other facilities); face-to-face time discussing  her blood glucose readings/logs, discussing hypoglycemia and hyperglycemia episodes and symptoms, medications doses, her options of short and long term treatment based on the latest standards of care / guidelines;  discussion about incorporating lifestyle medicine;  and documenting the encounter. Risk reduction counseling performed per USPSTF guidelines to reduce obesity and cardiovascular risk factors.     Please refer to Patient Instructions for Blood Glucose Monitoring and Insulin /Medications Dosing Guide  in media tab for additional information. Please  also refer to  Patient Self Inventory in the Media  tab for reviewed elements of pertinent patient history.  Dawn Anthony participated in the discussions, expressed understanding, and voiced agreement with the above plans.  All questions were answered to her satisfaction. she is encouraged to contact clinic should she have any questions or concerns prior to her return visit.     Follow up plan: - Return in about 4 months (around 04/25/2024) for Diabetes F/U with A1c in office, Previsit labs, Thyroid  follow up, Bring meter and logs.   Benton Rio, [facility] [address] [place], [place] [address] Phone: [phone] Fax: [phone]  12/24/2023, 3:15 PM

## 2023-12-29 ENCOUNTER — Other Ambulatory Visit: Payer: Self-pay | Admitting: *Deleted

## 2023-12-29 NOTE — Patient Outreach (Signed)
 Complex Care Management   Visit Note  12/29/2023  Name:  Dawn Anthony MRN: [id number] DOB: 10-01-1950  Situation: Referral received for Complex Care Management related to Diabetes with Complications and HTN I obtained verbal consent from Patient.  Visit completed with patient  on the phone  Background:   Past Medical History:  Diagnosis Date   Diabetes mellitus without complication (HCC)    Gastritis    Hyperlipidemia    Hypertension    Thyroid  disease     Assessment: Patient Reported Symptoms:  Cognitive Cognitive Status: No symptoms reported Cognitive/Intellectual Conditions Management [RPT]: None reported or documented in medical history or problem list   Health Maintenance Behaviors: Annual physical exam Healing Pattern: Average Health Facilitated by: Rest  Neurological Neurological Review of Symptoms: No symptoms reported Neurological Management Strategies: Coping strategies Neurological Self-Management Outcome: 4 (good)  HEENT HEENT Symptoms Reported: No symptoms reported HEENT Management Strategies: Routine screening HEENT Self-Management Outcome: 4 (good)    Cardiovascular Cardiovascular Symptoms Reported: No symptoms reported Does patient have uncontrolled Hypertension?: No Cardiovascular Management Strategies: Medication therapy, Routine screening Cardiovascular Self-Management Outcome: 4 (good)  Respiratory Respiratory Symptoms Reported: No symptoms reported Respiratory Management Strategies: Routine screening Respiratory Self-Management Outcome: 4 (good)  Endocrine Endocrine Symptoms Reported: No symptoms reported Is patient diabetic?: Yes Is patient checking blood sugars at home?: Yes List most recent blood sugar readings, include date and time of day: FBG 123 on 7/6 Endocrine Self-Management Outcome: 4 (good)  Gastrointestinal Gastrointestinal Symptoms Reported: No symptoms reported Gastrointestinal Self-Management Outcome: 4 (good)    Genitourinary  Genitourinary Symptoms Reported: No symptoms reported Genitourinary Self-Management Outcome: 4 (good)  Integumentary Integumentary Symptoms Reported: No symptoms reported    Musculoskeletal Musculoskelatal Symptoms Reviewed: No symptoms reported Musculoskeletal Management Strategies: Routine screening Musculoskeletal Self-Management Outcome: 4 (good) Falls in the past year?: Yes Number of falls in past year: 1 or less Was there an injury with Fall?: No Fall Risk Category Calculator: 1 Patient Fall Risk Level: Low Fall Risk Patient at Risk for Falls Due to: History of fall(s) Fall risk Follow up: Falls evaluation completed  Psychosocial Psychosocial Symptoms Reported: No symptoms reported Behavioral Health Self-Management Outcome: 4 (good) Major Change/Loss/Stressor/Fears (CP): Denies Techniques to Cope with Loss/Stress/Change: Not applicable Quality of Family Relationships: helpful, involved, supportive Do you feel physically threatened by others?: No      12/29/2023   10:47 AM  Depression screen PHQ 2/9  Decreased Interest 0  Down, Depressed, Hopeless 0  PHQ - 2 Score 0    Vitals:   12/29/23 1039  BP: 123/78  Pulse: 67    Medications Reviewed Today     Reviewed by Bertrum Rosina HERO, RN (Registered Nurse) on 12/29/23 at 1043  Med List Status: <None>   Medication Order Taking? Sig Documenting Provider Last Dose Status Informant  aspirin EC 81 MG tablet [id number] Yes Take 81 mg by mouth daily. Swallow whole. [provider]  Active   Calcium-Magnesium-Vitamin D  (CALCIUM 1200+D3 PO) [id number] Yes Take 1 tablet by mouth daily. [provider]  Active Self  carvedilol  (COREG ) 12.5 MG tablet [id number] Yes TAKE 1 TABLET (12.5MG  TOTAL) BY MOUTH TWICE A DAY WITH MEALS Rakes, Rock HERO, FNP  Active   cholecalciferol  (VITAMIN D3) 25 MCG (1000 UT) tablet [id number]  Take 50 Units by mouth daily.  Patient not taking: Reported on 12/29/2023   [provider]   Consider Medication Status and Discontinue Self  Continuous Glucose Sensor (FREESTYLE LIBRE 3 PLUS SENSOR) MISC [id number] Yes Change  sensor every 15 days. Therisa Benton PARAS, NP  Active   insulin  degludec (TRESIBA  FLEXTOUCH) 100 UNIT/ML FlexTouch Pen [id number] Yes Inject 25 Units into the skin at bedtime. Therisa Benton PARAS, NP  Active   insulin  lispro (HUMALOG  KWIKPEN) 100 UNIT/ML KwikPen [id number] Yes Inject 8-14 Units into the skin 3 (three) times daily. Therisa Benton PARAS, NP  Active   Insulin  Pen Needle (BD PEN NEEDLE NANO U/F) 32G X 4 MM MISC [id number] Yes UAD QID Dx E11.69 Therisa Benton PARAS, NP  Active   levothyroxine  (SYNTHROID ) 75 MCG tablet [id number] Yes Take 1 tablet (75 mcg total) by mouth daily before breakfast. Therisa Benton PARAS, NP  Active   lisinopril  (ZESTRIL ) 10 MG tablet [id number] Yes Take 1 tablet (10 mg total) by mouth daily. Lavona Agent, MD  Active   metFORMIN  (GLUCOPHAGE ) 1000 MG tablet [id number] Yes Take 1 tablet (1,000 mg total) by mouth 2 (two) times daily with a meal. Therisa Benton PARAS, NP  Active   rosuvastatin (CRESTOR) 20 MG tablet [id number] Yes Take 20 mg by mouth daily. [provider]  Active             Recommendation:   Continue Current Plan of Care  Follow Up Plan:   Telephone follow-up in 1 month  Rosina Forte, BSN RN [facility], [facility] RN Care Manager Direct Dial : [phone]  Fax: [phone]

## 2023-12-29 NOTE — Patient Instructions (Signed)
 Visit Information  Thank you for taking time to visit with me today. Please don't hesitate to contact me if I can be of assistance to you before our next scheduled appointment.  Your next care management appointment is by telephone on 01-29-2024 at 10:30 am  Telephone follow-up in 1 month  Please call the care guide team at [phone] if you need to cancel, schedule, or reschedule an appointment.   Please call the Suicide and Crisis Lifeline: 988 call the [place]  National Suicide Prevention Lifeline: 815-243-4611 or TTY: 989-845-8484 TTY 218 656 9043) to talk to a trained counselor call 1-800-273-TALK (toll free, 24 hour hotline) call the [facility]: 775-237-2601 call 911 if you are experiencing a Mental Health or Behavioral Health Crisis or need someone to talk to.  Rosina Forte, BSN RN [facility], [facility] RN Care Manager Direct Dial : [phone]  Fax: [phone]

## 2023-12-31 ENCOUNTER — Encounter: Payer: Self-pay | Admitting: Family Medicine

## 2023-12-31 ENCOUNTER — Ambulatory Visit (INDEPENDENT_AMBULATORY_CARE_PROVIDER_SITE_OTHER): Payer: HMO | Admitting: Family Medicine

## 2023-12-31 VITALS — BP 155/85 | HR 73 | Temp 95.7°F | Ht 61.0 in | Wt 147.6 lb

## 2023-12-31 DIAGNOSIS — E785 Hyperlipidemia, unspecified: Secondary | ICD-10-CM | POA: Diagnosis not present

## 2023-12-31 DIAGNOSIS — E1165 Type 2 diabetes mellitus with hyperglycemia: Secondary | ICD-10-CM | POA: Diagnosis not present

## 2023-12-31 DIAGNOSIS — E119 Type 2 diabetes mellitus without complications: Secondary | ICD-10-CM

## 2023-12-31 DIAGNOSIS — E890 Postprocedural hypothyroidism: Secondary | ICD-10-CM | POA: Diagnosis not present

## 2023-12-31 DIAGNOSIS — I2510 Atherosclerotic heart disease of native coronary artery without angina pectoris: Secondary | ICD-10-CM | POA: Diagnosis not present

## 2023-12-31 DIAGNOSIS — E1169 Type 2 diabetes mellitus with other specified complication: Secondary | ICD-10-CM | POA: Diagnosis not present

## 2023-12-31 DIAGNOSIS — I152 Hypertension secondary to endocrine disorders: Secondary | ICD-10-CM | POA: Diagnosis not present

## 2023-12-31 DIAGNOSIS — E1159 Type 2 diabetes mellitus with other circulatory complications: Secondary | ICD-10-CM | POA: Diagnosis not present

## 2023-12-31 DIAGNOSIS — E559 Vitamin D deficiency, unspecified: Secondary | ICD-10-CM

## 2023-12-31 DIAGNOSIS — Z794 Long term (current) use of insulin: Secondary | ICD-10-CM

## 2023-12-31 LAB — LIPID PANEL

## 2023-12-31 MED ORDER — LISINOPRIL 20 MG PO TABS: 20.0000 mg | ORAL_TABLET | Freq: Every day | ORAL | 3 refills | Status: DC

## 2023-12-31 NOTE — Progress Notes (Signed)
 Subjective:  Patient ID: Othel Clap, female    DOB: September 05, 1950, 73 y.o.   MRN: [id number]  Patient Care Team: Severa Rock HERO, FNP as PCP - General (Family Medicine) Bertrum Rosina HERO, RN as [facility] Care Management (General Practice) Bertrum Rosina HERO, RN   Chief Complaint:  Medical Management of Chronic Issues (6 month chronic check up )   HPI: Zaley Talley is a 73 y.o. female presenting on 12/31/2023 for Medical Management of Chronic Issues (6 month chronic check up )   The patient presents for follow-up of diabetes management and blood pressure control.  Diabetes mellitus management - Hemoglobin A1c decreased from 10.5 to 10.0 - Continuous glucose monitor (CGM) average glucose of 8.2 mmol/L over the last month - Follow-up appointment scheduled for November  Hypertension and antihypertensive therapy - Currently taking carvedilol  twice daily and lisinopril , recently increased from 5 mg to 10 mg daily - Experiencing fatigue and occasional dizziness, particularly with rapid positional changes - Lightheadedness in the mornings - No chest pain, leg swelling, or significant shortness of breath, except with exertion, which resolves with rest  Photosensitivity - Increased sensitivity to sunlight following recent eye surgery - Symptoms are more pronounced when not wearing sunglasses  Proteinuria - Protein detected in urine - No dysuria, urinary frequency, itching, or burning - High water intake, believing it benefits renal function          Relevant past medical, surgical, family, and social history reviewed and updated as indicated.  Allergies and medications reviewed and updated. Data reviewed: Chart in Epic.   Past Medical History:  Diagnosis Date   Diabetes mellitus without complication (HCC)    Gastritis    Hyperlipidemia    Hypertension    Thyroid  disease     Past Surgical History:  Procedure Laterality Date   CORONARY ARTERY BYPASS GRAFT  2022   EYE  SURGERY     KNEE SURGERY Bilateral    OTHER SURGICAL HISTORY     shoulder surgery   THYROID  SURGERY      Social History   Socioeconomic History   Marital status: Widowed    Spouse name: Not on file   Number of children: 1   Years of education: Not on file   Highest education level: 12th grade  Occupational History   Not on file  Tobacco Use   Smoking status: Never   Smokeless tobacco: Never  Vaping Use   Vaping status: Never Used  Substance and Sexual Activity   Alcohol use: Never   Drug use: Never   Sexual activity: Not Currently  Other Topics Concern   Not on file  Social History Narrative   Grandson lives with her.    Social Drivers of Corporate investment banker Strain: Low Risk  (12/30/2023)   Overall Financial Resource Strain (CARDIA)    Difficulty of Paying Living Expenses: Not hard at all  Food Insecurity: Food Insecurity Present (12/30/2023)   Hunger Vital Sign    Worried About Running Out of Food in the Last Year: Never true    Ran Out of Food in the Last Year: Sometimes true  Transportation Needs: No Transportation Needs (12/30/2023)   PRAPARE - Administrator, Civil Service (Medical): No    Lack of Transportation (Non-Medical): No  Physical Activity: Insufficiently Active (12/30/2023)   Exercise Vital Sign    Days of Exercise per Week: 3 days    Minutes of Exercise per Session: 30 min  Stress:  No Stress Concern Present (12/30/2023)   [employer] of Occupational Health - Occupational Stress Questionnaire    Feeling of Stress: Not at all  Social Connections: Moderately Isolated (12/30/2023)   Social Connection and Isolation Panel    Frequency of Communication with Friends and Family: More than three times a week    Frequency of Social Gatherings with Friends and Family: More than three times a week    Attends Religious Services: 1 to 4 times per year    Active Member of [employer] or Organizations: No    Attends Banker Meetings: Not on  file    Marital Status: Widowed  Intimate Partner Violence: Not At Risk (12/29/2023)   Humiliation, Afraid, Rape, and Kick questionnaire    Fear of Current or Ex-Partner: No    Emotionally Abused: No    Physically Abused: No    Sexually Abused: No    Outpatient Encounter Medications as of 12/31/2023  Medication Sig   aspirin EC 81 MG tablet Take 81 mg by mouth daily. Swallow whole.   Calcium-Magnesium-Vitamin D  (CALCIUM 1200+D3 PO) Take 1 tablet by mouth daily.   carvedilol  (COREG ) 12.5 MG tablet TAKE 1 TABLET (12.5MG  TOTAL) BY MOUTH TWICE A DAY WITH MEALS   cholecalciferol  (VITAMIN D3) 25 MCG (1000 UT) tablet Take 50 Units by mouth daily.   Continuous Glucose Sensor (FREESTYLE LIBRE 3 PLUS SENSOR) MISC Change sensor every 15 days.   insulin  degludec (TRESIBA  FLEXTOUCH) 100 UNIT/ML FlexTouch Pen Inject 25 Units into the skin at bedtime.   insulin  lispro (HUMALOG  KWIKPEN) 100 UNIT/ML KwikPen Inject 8-14 Units into the skin 3 (three) times daily.   Insulin  Pen Needle (BD PEN NEEDLE NANO U/F) 32G X 4 MM MISC UAD QID Dx E11.69   levothyroxine  (SYNTHROID ) 75 MCG tablet Take 1 tablet (75 mcg total) by mouth daily before breakfast.   lisinopril  (ZESTRIL ) 20 MG tablet Take 1 tablet (20 mg total) by mouth daily.   metFORMIN  (GLUCOPHAGE ) 1000 MG tablet Take 1 tablet (1,000 mg total) by mouth 2 (two) times daily with a meal.   rosuvastatin (CRESTOR) 20 MG tablet Take 20 mg by mouth daily.   [DISCONTINUED] lisinopril  (ZESTRIL ) 10 MG tablet Take 1 tablet (10 mg total) by mouth daily.   No facility-administered encounter medications on file as of 12/31/2023.    Allergies  Allergen Reactions   Hydrochlorothiazide     Other Reaction(s): Unknown-Unspecified  Stopped due to pancreatitis   Insulin  Glargine-Lixisenatide Nausea And Vomiting   Simvastatin     Other Reaction(s): Unknown-Unspecified  Stopped due to pancreatitis  Stopped due to pancreatitis   Penicillins Hives   Amoxicillin Itching, Rash  and Dermatitis   Cat Dander Itching, Rash and Dermatitis   Latex Other (See Comments), Rash and Dermatitis    Pertinent ROS per HPI, otherwise unremarkable      Objective:  BP (!) 155/85   Pulse 73   Temp (!) 95.7 F (35.4 C)   Ht 5' 1 (1.549 m)   Wt 147 lb 9.6 oz (67 kg)   SpO2 94%   BMI 27.89 kg/m    Wt Readings from Last 3 Encounters:  12/31/23 147 lb 9.6 oz (67 kg)  12/24/23 146 lb (66.2 kg)  11/12/23 144 lb (65.3 kg)    Physical Exam Vitals and nursing note reviewed.  Constitutional:      General: She is not in acute distress.    Appearance: Normal appearance. She is well-developed and well-groomed. She is not ill-appearing,  toxic-appearing or diaphoretic.  HENT:     Head: Normocephalic and atraumatic.     Jaw: There is normal jaw occlusion.     Right Ear: Hearing normal.     Left Ear: Hearing normal.     Nose: Nose normal.     Mouth/Throat:     Lips: Pink.     Mouth: Mucous membranes are moist.     Pharynx: Oropharynx is clear. Uvula midline.  Eyes:     General: Lids are normal.     Extraocular Movements: Extraocular movements intact.     Conjunctiva/sclera: Conjunctivae normal.     Pupils: Pupils are equal, round, and reactive to light.  Neck:     Trachea: Trachea and phonation normal.  Cardiovascular:     Rate and Rhythm: Normal rate and regular rhythm.     Chest Wall: PMI is not displaced.     Pulses: Normal pulses.     Heart sounds: Normal heart sounds. No murmur heard.    No friction rub. No gallop.  Pulmonary:     Effort: Pulmonary effort is normal. No respiratory distress.     Breath sounds: Normal breath sounds. No wheezing.  Chest:     Comments: Well healed sternotomy Abdominal:     General: Bowel sounds are normal.     Palpations: Abdomen is soft.  Musculoskeletal:        General: Normal range of motion.     Cervical back: Normal range of motion and neck supple.     Right lower leg: No edema.     Left lower leg: No edema.  Skin:     General: Skin is warm and dry.     Capillary Refill: Capillary refill takes less than 2 seconds.     Coloration: Skin is not cyanotic, jaundiced or pale.     Findings: No rash.  Neurological:     General: No focal deficit present.     Mental Status: She is alert and oriented to person, place, and time.     Sensory: Sensation is intact.     Motor: Motor function is intact.     Coordination: Coordination is intact.     Gait: Gait is intact.     Deep Tendon Reflexes: Reflexes are normal and symmetric.  Psychiatric:        Attention and Perception: Attention and perception normal.        Mood and Affect: Mood and affect normal.        Speech: Speech normal.        Behavior: Behavior normal. Behavior is cooperative.        Thought Content: Thought content normal.        Cognition and Memory: Cognition and memory normal.        Judgment: Judgment normal.     Results for orders placed or performed in visit on 12/24/23  POCT UA - Microalbumin   Collection Time: 12/24/23  2:33 PM  Result Value Ref Range   Microalbumin Ur, POC 80mg /L mg/L   Creatinine, POC 300mg /dL mg/dL   Albumin/Creatinine Ratio, Urine, POC 30-300 mg/G   HgB A1c   Collection Time: 12/24/23  2:34 PM  Result Value Ref Range   Hemoglobin A1C 10.0 (A) 4.0 - 5.6 %   HbA1c POC (<> result, manual entry)     HbA1c, POC (prediabetic range)     HbA1c, POC (controlled diabetic range)         Pertinent labs & imaging results that were  available during my care of the patient were reviewed by me and considered in my medical decision making.  Assessment & Plan:  Laurrie was seen today for medical management of chronic issues.  Diagnoses and all orders for this visit:  Hyperlipidemia associated with type 2 diabetes mellitus (HCC) -     Lipid panel  Hypertension associated with diabetes (HCC) -     CBC with Differential/Platelet -     Lipid panel -     Microalbumin / creatinine urine ratio -     lisinopril  (ZESTRIL ) 20  MG tablet; Take 1 tablet (20 mg total) by mouth daily.  Type 2 diabetes mellitus without complication, with long-term current use of insulin  (HCC) -     CBC with Differential/Platelet -     Lipid panel -     Microalbumin / creatinine urine ratio  Coronary artery disease involving native coronary artery of native heart without angina pectoris -     CBC with Differential/Platelet -     Lipid panel  Postoperative hypothyroidism -     Lipid panel  Vitamin D  deficiency -     VITAMIN D  25 Hydroxy (Vit-D Deficiency, Fractures)      Hypertension Blood pressure is 155/85 mmHg, above target range. Currently on carvedilol  and lisinopril , with recent increase in lisinopril  from 5 mg to 10 mg by cardiologist. Plan to increase lisinopril  to 20 mg for better control. Reports occasional dizziness, possibly related to medication. - Increase lisinopril  to 20 mg daily - Monitor blood pressure at home - Advise slow position changes to mitigate dizziness - Ensure adequate hydration - Follow up in 4 weeks to assess blood pressure control  Coronary Artery Disease Managed with carvedilol . Reports occasional dizziness, possibly related to medication. - Advise slow position changes to mitigate dizziness - Ensure adequate hydration  Type 2 Diabetes Mellitus HbA1c decreased from 10.5% to 10.0%, still above target range of 7-8%. CGM showed average of 8.2% last month, indicating improvement. Endocrinologist advised maintaining current dietary habits as blood sugars were reported as perfect. Follow-up with endocrinologist in November. - Order HbA1c test to monitor diabetes control - Continue current diabetes management plan as per endocrinologist's advice  Thyroid  Function Monitoring Endocrinologist ordered thyroid  function tests (TSH and free T4) to assess thyroid  status. Reports no symptoms of thyroid  dysfunction. - Order TSH and free T4 tests as per endocrinologist's request  General Health  Maintenance Plan to assess overall health status, including vitamin D  levels, cholesterol, and CBC. - Order vitamin D  level - Order lipid panel - Order CBC          Continue all other maintenance medications.  Follow up plan: Return in about 4 weeks (around 01/28/2024), or if symptoms worsen or fail to improve, for HTN, BMP.   Continue healthy lifestyle choices, including diet (rich in fruits, vegetables, and lean proteins, and low in salt and simple carbohydrates) and exercise (at least 30 minutes of moderate physical activity daily).  Educational handout given for health maintenance   The above assessment and management plan was discussed with the patient. The patient verbalized understanding of and has agreed to the management plan. Patient is aware to call the clinic if they develop any new symptoms or if symptoms persist or worsen. Patient is aware when to return to the clinic for a follow-up visit. Patient educated on when it is appropriate to go to the emergency department.   Rosaline Bruns, FNP-C [facility] [phone]

## 2024-01-01 ENCOUNTER — Ambulatory Visit: Payer: Self-pay | Admitting: Family Medicine

## 2024-01-01 LAB — CBC WITH DIFFERENTIAL/PLATELET
Basophils Absolute: 0.1 x10E3/uL (ref 0.0–0.2)
Basos: 1 %
EOS (ABSOLUTE): 0.3 x10E3/uL (ref 0.0–0.4)
Eos: 5 %
Hematocrit: 42.3 % (ref 34.0–46.6)
Hemoglobin: 13.6 g/dL (ref 11.1–15.9)
Immature Grans (Abs): 0 x10E3/uL (ref 0.0–0.1)
Immature Granulocytes: 0 %
Lymphocytes Absolute: 2 x10E3/uL (ref 0.7–3.1)
Lymphs: 29 %
MCH: 29.8 pg (ref 26.6–33.0)
MCHC: 32.2 g/dL (ref 31.5–35.7)
MCV: 93 fL (ref 79–97)
Monocytes Absolute: 0.4 x10E3/uL (ref 0.1–0.9)
Monocytes: 6 %
Neutrophils Absolute: 4 x10E3/uL (ref 1.4–7.0)
Neutrophils: 58 %
Platelets: 256 x10E3/uL (ref 150–450)
RBC: 4.57 x10E6/uL (ref 3.77–5.28)
RDW: 13.1 % (ref 11.7–15.4)
WBC: 6.8 x10E3/uL (ref 3.4–10.8)

## 2024-01-01 LAB — COMPREHENSIVE METABOLIC PANEL WITH GFR
ALT: 11 IU/L (ref 0–32)
AST: 16 IU/L (ref 0–40)
Albumin: 4.3 g/dL (ref 3.8–4.8)
Alkaline Phosphatase: 76 IU/L (ref 44–121)
BUN/Creatinine Ratio: 23 (ref 12–28)
BUN: 16 mg/dL (ref 8–27)
Bilirubin Total: 0.4 mg/dL (ref 0.0–1.2)
CO2: 21 mmol/L (ref 20–29)
Calcium: 9.4 mg/dL (ref 8.7–10.3)
Chloride: 98 mmol/L (ref 96–106)
Creatinine, Ser: 0.7 mg/dL (ref 0.57–1.00)
Globulin, Total: 2.1 g/dL (ref 1.5–4.5)
Glucose: 287 mg/dL — ABNORMAL HIGH (ref 70–99)
Potassium: 4.7 mmol/L (ref 3.5–5.2)
Sodium: 136 mmol/L (ref 134–144)
Total Protein: 6.4 g/dL (ref 6.0–8.5)
eGFR: 92 mL/min/1.73 (ref >59)

## 2024-01-01 LAB — LIPID PANEL
Cholesterol, Total: 149 mg/dL (ref 100–199)
HDL: 75 mg/dL (ref >39)
LDL CALC COMMENT:: 2 ratio (ref 0.0–4.4)
LDL Chol Calc (NIH): 59 mg/dL (ref 0–99)
Triglycerides: 75 mg/dL (ref 0–149)
VLDL Cholesterol Cal: 15 mg/dL (ref 5–40)

## 2024-01-01 LAB — TSH: TSH: 1.11 u[IU]/mL (ref 0.450–4.500)

## 2024-01-01 LAB — MICROALBUMIN / CREATININE URINE RATIO
Creatinine, Urine: 59.2 mg/dL
Microalb/Creat Ratio: 24 mg/g{creat} (ref 0–29)
Microalbumin, Urine: 14.5 ug/mL

## 2024-01-01 LAB — T4, FREE: Free T4: 1.62 ng/dL (ref 0.82–1.77)

## 2024-01-01 LAB — VITAMIN D 25 HYDROXY (VIT D DEFICIENCY, FRACTURES): Vit D, 25-Hydroxy: 54 ng/mL (ref 30.0–100.0)

## 2024-01-03 ENCOUNTER — Other Ambulatory Visit: Payer: Self-pay | Admitting: Family Medicine

## 2024-01-05 ENCOUNTER — Telehealth: Payer: Self-pay | Admitting: Family Medicine

## 2024-01-05 NOTE — Telephone Encounter (Signed)
 Copied from CRM [id number]. Topic: Clinical - Medication Refill >> Jan 05, 2024 10:52 AM DeAngela L wrote: Medication: rosuvastatin (CRESTOR) 20 MG tablet   Has the patient contacted their pharmacy? Yes (Agent: If no, request that the patient contact the pharmacy for the refill. If patient does not wish to contact the pharmacy document the reason why and proceed with request.) (Agent: If yes, when and what did the pharmacy advise?)  This is the patient's preferred pharmacy:  [id number] - [place], [place] - [address] [place] [address] Phone: [phone] Fax: [phone]  Is this the correct pharmacy for this prescription? Yes  If no, delete pharmacy and type the correct one.   Has the prescription been filled recently? Yes   Is the patient out of the medication? Yes   Has the patient been seen for an appointment in the last year OR does the patient have an upcoming appointment? Yes   Can we respond through MyChart? No   Agent: Please be advised that Rx refills may take up to 3 business days. We ask that you follow-up with your pharmacy.

## 2024-01-06 ENCOUNTER — Telehealth: Payer: Self-pay | Admitting: Family Medicine

## 2024-01-06 NOTE — Telephone Encounter (Unsigned)
 Copied from CRM [id number]. Topic: Clinical - Prescription Issue >> Jan 06, 2024  9:54 AM Tonda B wrote: Reason for CRM: rosuvastatin (CRESTOR) 20 MG tablet  patient needs prior authorization

## 2024-01-08 ENCOUNTER — Other Ambulatory Visit: Payer: Self-pay | Admitting: Family Medicine

## 2024-01-08 ENCOUNTER — Other Ambulatory Visit (HOSPITAL_COMMUNITY): Payer: Self-pay

## 2024-01-08 ENCOUNTER — Telehealth: Payer: Self-pay

## 2024-01-08 NOTE — Telephone Encounter (Signed)
 PA not needed. Test claim showing $0.00/90 days of generic Crestor/rosuvastatin

## 2024-01-08 NOTE — Telephone Encounter (Signed)
 Pharmacy Patient Advocate Encounter   Received notification from Pt Calls Messages that prior authorization for Rosuvastatin 20mg  is required/requested.   Insurance verification completed.   The patient is insured through [facility] ADVANTAGE/RX ADVANCE .   Per test claim: The current 90 day co-pay is, $0.00.  No PA needed at this time. This test claim was processed through [facility]- copay amounts may vary at other pharmacies due to pharmacy/plan contracts, or as the patient moves through the different stages of their insurance plan.

## 2024-01-09 NOTE — Telephone Encounter (Signed)
 Contacted pharmacy. Rosuvastatin filled.  Contacted patient to inform. She will pick up today.

## 2024-01-28 ENCOUNTER — Other Ambulatory Visit: Payer: Self-pay | Admitting: Medical Genetics

## 2024-01-29 ENCOUNTER — Encounter: Payer: Self-pay | Admitting: *Deleted

## 2024-01-29 ENCOUNTER — Telehealth: Payer: Self-pay | Admitting: *Deleted

## 2024-01-29 NOTE — Patient Instructions (Signed)
 Othel Clap - I am sorry that I was unable to completed outreach with you today.  I have rescheduled your appointment for 02-03-24 at 9:30 am. I work with Severa, Rock HERO, FNP and am calling to support your healthcare needs. Please contact me at [phone] at your earliest convenience. I look forward to speaking with you soon.   Thank you, Atira Borello, RN, BSN, ACM RN Care Manager [employer] [phone]

## 2024-02-02 ENCOUNTER — Other Ambulatory Visit (HOSPITAL_COMMUNITY)
Admission: RE | Admit: 2024-02-02 | Discharge: 2024-02-02 | Disposition: A | Discharge status: Home / Self Care | Payer: Self-pay | Source: Ambulatory Visit | Attending: Medical Genetics | Admitting: Medical Genetics

## 2024-02-03 ENCOUNTER — Telehealth: Payer: Self-pay | Admitting: *Deleted

## 2024-02-03 ENCOUNTER — Telehealth: Payer: Self-pay

## 2024-02-03 ENCOUNTER — Encounter: Payer: Self-pay | Admitting: *Deleted

## 2024-02-03 NOTE — Patient Instructions (Signed)
 Dawn Anthony - I am sorry I was unable to reach you today for our scheduled appointment. I work with Severa Rock HERO, FNP and am calling to support your healthcare needs. Please contact me at [phone] at your earliest convenience. I look forward to speaking with you soon.   Thank you,  Rosina Forte, BSN RN [facility], [facility] RN Care Manager Direct Dial : [phone]  Fax: [phone]

## 2024-02-03 NOTE — Telephone Encounter (Signed)
 Copied from CRM [id number]. Topic: General - Other >> Feb 03, 2024  9:42 AM Myrick T wrote: Reason for CRM: patient returned the call from [facility] and request that she call back and let the phone ring more than 5 times as it only rings twice on her end

## 2024-02-04 ENCOUNTER — Ambulatory Visit: Admitting: Family Medicine

## 2024-02-04 ENCOUNTER — Encounter: Payer: Self-pay | Admitting: Family Medicine

## 2024-02-04 VITALS — BP 155/89 | HR 82 | Temp 97.0°F | Ht 61.0 in | Wt 149.8 lb

## 2024-02-04 DIAGNOSIS — E1159 Type 2 diabetes mellitus with other circulatory complications: Secondary | ICD-10-CM | POA: Diagnosis not present

## 2024-02-04 DIAGNOSIS — E119 Type 2 diabetes mellitus without complications: Secondary | ICD-10-CM | POA: Diagnosis not present

## 2024-02-04 DIAGNOSIS — Z794 Long term (current) use of insulin: Secondary | ICD-10-CM | POA: Diagnosis not present

## 2024-02-04 DIAGNOSIS — I152 Hypertension secondary to endocrine disorders: Secondary | ICD-10-CM | POA: Diagnosis not present

## 2024-02-04 LAB — BMP8+EGFR
BUN/Creatinine Ratio: 20 (ref 12–28)
BUN: 16 mg/dL (ref 8–27)
CO2: 22 mmol/L (ref 20–29)
Calcium: 10.3 mg/dL (ref 8.7–10.3)
Chloride: 100 mmol/L (ref 96–106)
Creatinine, Ser: 0.8 mg/dL (ref 0.57–1.00)
Glucose: 231 mg/dL — ABNORMAL HIGH (ref 70–99)
Potassium: 4.6 mmol/L (ref 3.5–5.2)
Sodium: 139 mmol/L (ref 134–144)
eGFR: 79 mL/min/1.73 (ref >59)

## 2024-02-04 MED ORDER — BLOOD PRESSURE MONITOR AUTOMAT DEVI: 1.0000 | Freq: Two times a day (BID) | 0 refills | Status: AC

## 2024-02-04 MED ORDER — CARVEDILOL 12.5 MG PO TABS
12.5000 mg | ORAL_TABLET | Freq: Two times a day (BID) | ORAL | 1 refills | Status: AC
Start: 2024-02-04 — End: ?

## 2024-02-04 MED ORDER — LISINOPRIL 20 MG PO TABS: 20.0000 mg | ORAL_TABLET | Freq: Two times a day (BID) | ORAL | 3 refills | Status: AC

## 2024-02-04 NOTE — Progress Notes (Signed)
 Subjective:  Patient ID: Dawn Anthony, female    DOB: 1951/05/14, 73 y.o.   MRN: [id number]  Patient Care Team: Severa Rock HERO, FNP as PCP - General (Family Medicine) Bertrum Rosina HERO, RN as [facility] Care Management (General Practice) Bertrum Rosina HERO, RN   Chief Complaint:  Hypertension (4 week follow up )   HPI: Dawn Anthony is a 73 y.o. female presenting on 02/04/2024 for Hypertension (4 week follow up )  Dawn Anthony is a 73 year old female with hypertension and diabetes who presents for blood pressure management.  Her blood pressure was 111/78 mmHg at home this morning, but it was elevated upon arrival at the clinic. She uses a wrist cuff for home measurements. Her lisinopril  dose was increased to 20 mg last month, and she tolerates it well, although she experiences occasional lightheadedness during the day. She is also taking carvedilol  12.5 mg twice daily. No headaches, chest pain, or leg swelling, but she reports mild shortness of breath during exercise, which resolves with rest.  Her blood sugars have been running high, and she is trying to determine dietary changes to better manage her diabetes. She has an upcoming appointment with her endocrinologist in November. She missed a phone visit with the clinical pharmacist due to phone issues and has not been able to reschedule.          Relevant past medical, surgical, family, and social history reviewed and updated as indicated.  Allergies and medications reviewed and updated. Data reviewed: Chart in Epic.   Past Medical History:  Diagnosis Date   Diabetes mellitus without complication (HCC)    Gastritis    Hyperlipidemia    Hypertension    Thyroid  disease     Past Surgical History:  Procedure Laterality Date   CORONARY ARTERY BYPASS GRAFT  2022   EYE SURGERY     KNEE SURGERY Bilateral    OTHER SURGICAL HISTORY     shoulder surgery   THYROID  SURGERY      Social History   Socioeconomic History    Marital status: Widowed    Spouse name: Not on file   Number of children: 1   Years of education: Not on file   Highest education level: 12th grade  Occupational History   Not on file  Tobacco Use   Smoking status: Never   Smokeless tobacco: Never  Vaping Use   Vaping status: Never Used  Substance and Sexual Activity   Alcohol use: Never   Drug use: Never   Sexual activity: Not Currently  Other Topics Concern   Not on file  Social History Narrative   Grandson lives with her.    Social Drivers of Corporate investment banker Strain: Low Risk  (12/30/2023)   Overall Financial Resource Strain (CARDIA)    Difficulty of Paying Living Expenses: Not hard at all  Food Insecurity: Food Insecurity Present (12/30/2023)   Hunger Vital Sign    Worried About Running Out of Food in the Last Year: Never true    Ran Out of Food in the Last Year: Sometimes true  Transportation Needs: No Transportation Needs (12/30/2023)   PRAPARE - Administrator, Civil Service (Medical): No    Lack of Transportation (Non-Medical): No  Physical Activity: Insufficiently Active (12/30/2023)   Exercise Vital Sign    Days of Exercise per Week: 3 days    Minutes of Exercise per Session: 30 min  Stress: No Stress Concern Present (12/30/2023)  [employer] of Occupational Health - Occupational Stress Questionnaire    Feeling of Stress: Not at all  Social Connections: Moderately Isolated (12/30/2023)   Social Connection and Isolation Panel    Frequency of Communication with Friends and Family: More than three times a week    Frequency of Social Gatherings with Friends and Family: More than three times a week    Attends Religious Services: 1 to 4 times per year    Active Member of [employer] or Organizations: No    Attends Banker Meetings: Not on file    Marital Status: Widowed  Intimate Partner Violence: Not At Risk (12/29/2023)   Humiliation, Afraid, Rape, and Kick questionnaire    Fear of  Current or Ex-Partner: No    Emotionally Abused: No    Physically Abused: No    Sexually Abused: No    Outpatient Encounter Medications as of 02/04/2024  Medication Sig   aspirin EC 81 MG tablet Take 81 mg by mouth daily. Swallow whole.   Blood Pressure Monitoring (BLOOD PRESSURE MONITOR AUTOMAT) DEVI 1 Device by Does not apply route in the morning and at bedtime.   Calcium-Magnesium-Vitamin D  (CALCIUM 1200+D3 PO) Take 1 tablet by mouth daily.   cholecalciferol  (VITAMIN D3) 25 MCG (1000 UT) tablet Take 50 Units by mouth daily.   Continuous Glucose Sensor (FREESTYLE LIBRE 3 PLUS SENSOR) MISC Change sensor every 15 days.   insulin  degludec (TRESIBA  FLEXTOUCH) 100 UNIT/ML FlexTouch Pen Inject 25 Units into the skin at bedtime.   insulin  lispro (HUMALOG  KWIKPEN) 100 UNIT/ML KwikPen Inject 8-14 Units into the skin 3 (three) times daily.   Insulin  Pen Needle (BD PEN NEEDLE NANO U/F) 32G X 4 MM MISC UAD QID Dx E11.69   levothyroxine  (SYNTHROID ) 75 MCG tablet Take 1 tablet (75 mcg total) by mouth daily before breakfast.   metFORMIN  (GLUCOPHAGE ) 1000 MG tablet Take 1 tablet (1,000 mg total) by mouth 2 (two) times daily with a meal.   rosuvastatin (CRESTOR) 20 MG tablet TAKE 1 TABLET BY MOUTH EVERYDAY AT BEDTIME   [DISCONTINUED] carvedilol  (COREG ) 12.5 MG tablet TAKE 1 TABLET (12.5MG  TOTAL) BY MOUTH TWICE A DAY WITH MEALS   [DISCONTINUED] lisinopril  (ZESTRIL ) 20 MG tablet Take 1 tablet (20 mg total) by mouth daily.   carvedilol  (COREG ) 12.5 MG tablet Take 1 tablet (12.5 mg total) by mouth 2 (two) times daily with a meal.   lisinopril  (ZESTRIL ) 20 MG tablet Take 1 tablet (20 mg total) by mouth 2 (two) times daily.   No facility-administered encounter medications on file as of 02/04/2024.    Allergies  Allergen Reactions   Hydrochlorothiazide     Other Reaction(s): Unknown-Unspecified  Stopped due to pancreatitis   Insulin  Glargine-Lixisenatide Nausea And Vomiting   Simvastatin     Other  Reaction(s): Unknown-Unspecified  Stopped due to pancreatitis  Stopped due to pancreatitis   Penicillins Hives   Amoxicillin Itching, Rash and Dermatitis   Cat Dander Itching, Rash and Dermatitis   Latex Other (See Comments), Rash and Dermatitis    Pertinent ROS per HPI, otherwise unremarkable      Objective:  BP (!) 155/89   Pulse 82   Temp (!) 97 F (36.1 C)   Ht 5' 1 (1.549 m)   Wt 149 lb 12.8 oz (67.9 kg)   SpO2 95%   BMI 28.30 kg/m    Wt Readings from Last 3 Encounters:  02/04/24 149 lb 12.8 oz (67.9 kg)  12/31/23 147 lb 9.6 oz (67  kg)  12/24/23 146 lb (66.2 kg)    Physical Exam Constitutional:      Appearance: Normal appearance.  HENT:     Head: Normocephalic and atraumatic.     Nose: Nose normal.     Mouth/Throat:     Mouth: Mucous membranes are moist.  Eyes:     Conjunctiva/sclera: Conjunctivae normal.  Cardiovascular:     Rate and Rhythm: Normal rate and regular rhythm.     Heart sounds: Normal heart sounds.  Pulmonary:     Effort: Pulmonary effort is normal.     Breath sounds: Normal breath sounds.  Musculoskeletal:     Cervical back: Neck supple.     Right lower leg: No edema.     Left lower leg: No edema.  Skin:    General: Skin is warm and dry.     Capillary Refill: Capillary refill takes less than 2 seconds.  Neurological:     General: No focal deficit present.     Mental Status: She is alert and oriented to person, place, and time.  Psychiatric:        Mood and Affect: Mood normal.        Behavior: Behavior normal.        Thought Content: Thought content normal.        Judgment: Judgment normal.     VITALS: BP- 142/86 CARDIOVASCULAR: Blood pressure 142/86 mmHg right arm, 138/80 mmHg left arm.        Results for orders placed or performed in visit on 12/31/23  CBC with Differential/Platelet   Collection Time: 12/31/23  9:00 AM  Result Value Ref Range   WBC 6.8 3.4 - 10.8 x10E3/uL   RBC 4.57 3.77 - 5.28 x10E6/uL   Hemoglobin  13.6 11.1 - 15.9 g/dL   Hematocrit 57.6 65.9 - 46.6 %   MCV 93 79 - 97 fL   MCH 29.8 26.6 - 33.0 pg   MCHC 32.2 31.5 - 35.7 g/dL   RDW 86.8 88.2 - 84.5 %   Platelets 256 150 - 450 x10E3/uL   Neutrophils 58 Not Estab. %   Lymphs 29 Not Estab. %   Monocytes 6 Not Estab. %   Eos 5 Not Estab. %   Basos 1 Not Estab. %   Neutrophils Absolute 4.0 1.4 - 7.0 x10E3/uL   Lymphocytes Absolute 2.0 0.7 - 3.1 x10E3/uL   Monocytes Absolute 0.4 0.1 - 0.9 x10E3/uL   EOS (ABSOLUTE) 0.3 0.0 - 0.4 x10E3/uL   Basophils Absolute 0.1 0.0 - 0.2 x10E3/uL   Immature Granulocytes 0 Not Estab. %   Immature Grans (Abs) 0.0 0.0 - 0.1 x10E3/uL  VITAMIN D  25 Hydroxy (Vit-D Deficiency, Fractures)   Collection Time: 12/31/23  9:00 AM  Result Value Ref Range   Vit D, 25-Hydroxy 54.0 30.0 - 100.0 ng/mL  Lipid panel   Collection Time: 12/31/23  9:00 AM  Result Value Ref Range   Cholesterol, Total 149 100 - 199 mg/dL   Triglycerides 75 0 - 149 mg/dL   HDL 75 >60 mg/dL   VLDL Cholesterol Cal 15 5 - 40 mg/dL   LDL Chol Calc (NIH) 59 0 - 99 mg/dL   Chol/HDL Ratio 2.0 0.0 - 4.4 ratio  Microalbumin / creatinine urine ratio   Collection Time: 12/31/23 10:16 AM  Result Value Ref Range   Creatinine, Urine 59.2 Not Estab. mg/dL   Microalbumin, Urine 85.4 Not Estab. ug/mL   Microalb/Creat Ratio 24 0 - 29 mg/g creat  Pertinent labs & imaging results that were available during my care of the patient were reviewed by me and considered in my medical decision making.  Assessment & Plan:  Leomia was seen today for hypertension.  Diagnoses and all orders for this visit:  Hypertension associated with diabetes (HCC) -     carvedilol  (COREG ) 12.5 MG tablet; Take 1 tablet (12.5 mg total) by mouth 2 (two) times daily with a meal. -     BMP8+EGFR -     lisinopril  (ZESTRIL ) 20 MG tablet; Take 1 tablet (20 mg total) by mouth 2 (two) times daily. -     Blood Pressure Monitoring (BLOOD PRESSURE MONITOR AUTOMAT) DEVI; 1  Device by Does not apply route in the morning and at bedtime.  Type 2 diabetes mellitus without complication, with long-term current use of insulin  (HCC) -     BMP8+EGFR      Hypertension Home blood pressure readings were 111/78 mmHg, but in-office readings were elevated at 142/86 mmHg in the right arm and 138/80 mmHg in the left arm. Current medications include lisinopril  20 mg daily and carvedilol  12.5 mg twice daily. Wrist cuff used at home may not provide accurate readings. Shortness of breath during exercise is attributed to deconditioning. - Increase lisinopril  to 20 mg twice daily. - Continue carvedilol  12.5 mg twice daily. - Order BMP to check renal function, specifically creatinine levels. - Schedule a follow-up appointment in two weeks for blood pressure recheck with the nurse. - Bring home blood pressure cuff to the follow-up appointment for comparison. - Check if insurance covers a new blood pressure cuff and provide one if available.  Type 2 diabetes mellitus Type 2 diabetes mellitus with persistently high blood sugar levels. Missed phone visit with clinical pharmacist for diabetes management due to phone issues. Next endocrinology appointment is in November. - Ensure follow-up with clinical pharmacist for diabetes management. - Discuss dietary management strategies to control blood sugar levels.  Deconditioning Experiencing shortness of breath during exercise, which resolves with rest. This is attributed to deconditioning due to increased physical activity.  General Health Maintenance Participation in a DNA screening for cancer risk assessment.          Continue all other maintenance medications.  Follow up plan: Return in about 2 weeks (around 02/18/2024) for nurse visit for BP and BMP.   Continue healthy lifestyle choices, including diet (rich in fruits, vegetables, and lean proteins, and low in salt and simple carbohydrates) and exercise (at least 30 minutes of  moderate physical activity daily).  Educational handout given for DASH  The above assessment and management plan was discussed with the patient. The patient verbalized understanding of and has agreed to the management plan. Patient is aware to call the clinic if they develop any new symptoms or if symptoms persist or worsen. Patient is aware when to return to the clinic for a follow-up visit. Patient educated on when it is appropriate to go to the emergency department.   Rosaline Bruns, FNP-C [facility] [phone]

## 2024-02-04 NOTE — Patient Instructions (Signed)

## 2024-02-04 NOTE — Progress Notes (Signed)
 Subjective:  Patient ID: Dawn Anthony, female    DOB: 1951/05/14, 73 y.o.   MRN: [id number]  Patient Care Team: Severa Rock HERO, FNP as PCP - General (Family Medicine) Bertrum Rosina HERO, RN as [facility] Care Management (General Practice) Bertrum Rosina HERO, RN   Chief Complaint:  Hypertension (4 week follow up )   HPI: Dawn Anthony is a 73 y.o. female presenting on 02/04/2024 for Hypertension (4 week follow up )  Dawn Anthony is a 73 year old female with hypertension and diabetes who presents for blood pressure management.  Her blood pressure was 111/78 mmHg at home this morning, but it was elevated upon arrival at the clinic. She uses a wrist cuff for home measurements. Her lisinopril  dose was increased to 20 mg last month, and she tolerates it well, although she experiences occasional lightheadedness during the day. She is also taking carvedilol  12.5 mg twice daily. No headaches, chest pain, or leg swelling, but she reports mild shortness of breath during exercise, which resolves with rest.  Her blood sugars have been running high, and she is trying to determine dietary changes to better manage her diabetes. She has an upcoming appointment with her endocrinologist in November. She missed a phone visit with the clinical pharmacist due to phone issues and has not been able to reschedule.          Relevant past medical, surgical, family, and social history reviewed and updated as indicated.  Allergies and medications reviewed and updated. Data reviewed: Chart in Epic.   Past Medical History:  Diagnosis Date   Diabetes mellitus without complication (HCC)    Gastritis    Hyperlipidemia    Hypertension    Thyroid  disease     Past Surgical History:  Procedure Laterality Date   CORONARY ARTERY BYPASS GRAFT  2022   EYE SURGERY     KNEE SURGERY Bilateral    OTHER SURGICAL HISTORY     shoulder surgery   THYROID  SURGERY      Social History   Socioeconomic History    Marital status: Widowed    Spouse name: Not on file   Number of children: 1   Years of education: Not on file   Highest education level: 12th grade  Occupational History   Not on file  Tobacco Use   Smoking status: Never   Smokeless tobacco: Never  Vaping Use   Vaping status: Never Used  Substance and Sexual Activity   Alcohol use: Never   Drug use: Never   Sexual activity: Not Currently  Other Topics Concern   Not on file  Social History Narrative   Grandson lives with her.    Social Drivers of Corporate investment banker Strain: Low Risk  (12/30/2023)   Overall Financial Resource Strain (CARDIA)    Difficulty of Paying Living Expenses: Not hard at all  Food Insecurity: Food Insecurity Present (12/30/2023)   Hunger Vital Sign    Worried About Running Out of Food in the Last Year: Never true    Ran Out of Food in the Last Year: Sometimes true  Transportation Needs: No Transportation Needs (12/30/2023)   PRAPARE - Administrator, Civil Service (Medical): No    Lack of Transportation (Non-Medical): No  Physical Activity: Insufficiently Active (12/30/2023)   Exercise Vital Sign    Days of Exercise per Week: 3 days    Minutes of Exercise per Session: 30 min  Stress: No Stress Concern Present (12/30/2023)  [employer] of Occupational Health - Occupational Stress Questionnaire    Feeling of Stress: Not at all  Social Connections: Moderately Isolated (12/30/2023)   Social Connection and Isolation Panel    Frequency of Communication with Friends and Family: More than three times a week    Frequency of Social Gatherings with Friends and Family: More than three times a week    Attends Religious Services: 1 to 4 times per year    Active Member of [employer] or Organizations: No    Attends Banker Meetings: Not on file    Marital Status: Widowed  Intimate Partner Violence: Not At Risk (12/29/2023)   Humiliation, Afraid, Rape, and Kick questionnaire    Fear of  Current or Ex-Partner: No    Emotionally Abused: No    Physically Abused: No    Sexually Abused: No    Outpatient Encounter Medications as of 02/04/2024  Medication Sig   aspirin EC 81 MG tablet Take 81 mg by mouth daily. Swallow whole.   Blood Pressure Monitoring (BLOOD PRESSURE MONITOR AUTOMAT) DEVI 1 Device by Does not apply route in the morning and at bedtime.   Calcium-Magnesium-Vitamin D  (CALCIUM 1200+D3 PO) Take 1 tablet by mouth daily.   cholecalciferol  (VITAMIN D3) 25 MCG (1000 UT) tablet Take 50 Units by mouth daily.   Continuous Glucose Sensor (FREESTYLE LIBRE 3 PLUS SENSOR) MISC Change sensor every 15 days.   insulin  degludec (TRESIBA  FLEXTOUCH) 100 UNIT/ML FlexTouch Pen Inject 25 Units into the skin at bedtime.   insulin  lispro (HUMALOG  KWIKPEN) 100 UNIT/ML KwikPen Inject 8-14 Units into the skin 3 (three) times daily.   Insulin  Pen Needle (BD PEN NEEDLE NANO U/F) 32G X 4 MM MISC UAD QID Dx E11.69   levothyroxine  (SYNTHROID ) 75 MCG tablet Take 1 tablet (75 mcg total) by mouth daily before breakfast.   metFORMIN  (GLUCOPHAGE ) 1000 MG tablet Take 1 tablet (1,000 mg total) by mouth 2 (two) times daily with a meal.   rosuvastatin (CRESTOR) 20 MG tablet TAKE 1 TABLET BY MOUTH EVERYDAY AT BEDTIME   [DISCONTINUED] carvedilol  (COREG ) 12.5 MG tablet TAKE 1 TABLET (12.5MG  TOTAL) BY MOUTH TWICE A DAY WITH MEALS   [DISCONTINUED] lisinopril  (ZESTRIL ) 20 MG tablet Take 1 tablet (20 mg total) by mouth daily.   carvedilol  (COREG ) 12.5 MG tablet Take 1 tablet (12.5 mg total) by mouth 2 (two) times daily with a meal.   lisinopril  (ZESTRIL ) 20 MG tablet Take 1 tablet (20 mg total) by mouth 2 (two) times daily.   No facility-administered encounter medications on file as of 02/04/2024.    Allergies  Allergen Reactions   Hydrochlorothiazide     Other Reaction(s): Unknown-Unspecified  Stopped due to pancreatitis   Insulin  Glargine-Lixisenatide Nausea And Vomiting   Simvastatin     Other  Reaction(s): Unknown-Unspecified  Stopped due to pancreatitis  Stopped due to pancreatitis   Penicillins Hives   Amoxicillin Itching, Rash and Dermatitis   Cat Dander Itching, Rash and Dermatitis   Latex Other (See Comments), Rash and Dermatitis    Pertinent ROS per HPI, otherwise unremarkable      Objective:  BP (!) 155/89   Pulse 82   Temp (!) 97 F (36.1 C)   Ht 5' 1 (1.549 m)   Wt 149 lb 12.8 oz (67.9 kg)   SpO2 95%   BMI 28.30 kg/m    Wt Readings from Last 3 Encounters:  02/04/24 149 lb 12.8 oz (67.9 kg)  12/31/23 147 lb 9.6 oz (67  kg)  12/24/23 146 lb (66.2 kg)    Physical Exam Constitutional:      Appearance: Normal appearance.  HENT:     Head: Normocephalic and atraumatic.     Nose: Nose normal.     Mouth/Throat:     Mouth: Mucous membranes are moist.  Eyes:     Conjunctiva/sclera: Conjunctivae normal.  Cardiovascular:     Rate and Rhythm: Normal rate and regular rhythm.     Heart sounds: Normal heart sounds.  Pulmonary:     Effort: Pulmonary effort is normal.     Breath sounds: Normal breath sounds.  Musculoskeletal:     Cervical back: Neck supple.     Right lower leg: No edema.     Left lower leg: No edema.  Skin:    General: Skin is warm and dry.     Capillary Refill: Capillary refill takes less than 2 seconds.  Neurological:     General: No focal deficit present.     Mental Status: She is alert and oriented to person, place, and time.  Psychiatric:        Mood and Affect: Mood normal.        Behavior: Behavior normal.        Thought Content: Thought content normal.        Judgment: Judgment normal.     VITALS: BP- 142/86 CARDIOVASCULAR: Blood pressure 142/86 mmHg right arm, 138/80 mmHg left arm.        Results for orders placed or performed in visit on 12/31/23  CBC with Differential/Platelet   Collection Time: 12/31/23  9:00 AM  Result Value Ref Range   WBC 6.8 3.4 - 10.8 x10E3/uL   RBC 4.57 3.77 - 5.28 x10E6/uL   Hemoglobin  13.6 11.1 - 15.9 g/dL   Hematocrit 57.6 65.9 - 46.6 %   MCV 93 79 - 97 fL   MCH 29.8 26.6 - 33.0 pg   MCHC 32.2 31.5 - 35.7 g/dL   RDW 86.8 88.2 - 84.5 %   Platelets 256 150 - 450 x10E3/uL   Neutrophils 58 Not Estab. %   Lymphs 29 Not Estab. %   Monocytes 6 Not Estab. %   Eos 5 Not Estab. %   Basos 1 Not Estab. %   Neutrophils Absolute 4.0 1.4 - 7.0 x10E3/uL   Lymphocytes Absolute 2.0 0.7 - 3.1 x10E3/uL   Monocytes Absolute 0.4 0.1 - 0.9 x10E3/uL   EOS (ABSOLUTE) 0.3 0.0 - 0.4 x10E3/uL   Basophils Absolute 0.1 0.0 - 0.2 x10E3/uL   Immature Granulocytes 0 Not Estab. %   Immature Grans (Abs) 0.0 0.0 - 0.1 x10E3/uL  VITAMIN D  25 Hydroxy (Vit-D Deficiency, Fractures)   Collection Time: 12/31/23  9:00 AM  Result Value Ref Range   Vit D, 25-Hydroxy 54.0 30.0 - 100.0 ng/mL  Lipid panel   Collection Time: 12/31/23  9:00 AM  Result Value Ref Range   Cholesterol, Total 149 100 - 199 mg/dL   Triglycerides 75 0 - 149 mg/dL   HDL 75 >60 mg/dL   VLDL Cholesterol Cal 15 5 - 40 mg/dL   LDL Chol Calc (NIH) 59 0 - 99 mg/dL   Chol/HDL Ratio 2.0 0.0 - 4.4 ratio  Microalbumin / creatinine urine ratio   Collection Time: 12/31/23 10:16 AM  Result Value Ref Range   Creatinine, Urine 59.2 Not Estab. mg/dL   Microalbumin, Urine 85.4 Not Estab. ug/mL   Microalb/Creat Ratio 24 0 - 29 mg/g creat  Pertinent labs & imaging results that were available during my care of the patient were reviewed by me and considered in my medical decision making.  Assessment & Plan:  Dawn Anthony was seen today for hypertension.  Diagnoses and all orders for this visit:  Hypertension associated with diabetes (HCC) -     carvedilol  (COREG ) 12.5 MG tablet; Take 1 tablet (12.5 mg total) by mouth 2 (two) times daily with a meal. -     BMP8+EGFR -     lisinopril  (ZESTRIL ) 20 MG tablet; Take 1 tablet (20 mg total) by mouth 2 (two) times daily. -     Blood Pressure Monitoring (BLOOD PRESSURE MONITOR AUTOMAT) DEVI; 1  Device by Does not apply route in the morning and at bedtime.  Type 2 diabetes mellitus without complication, with long-term current use of insulin  (HCC) -     BMP8+EGFR      Hypertension Home blood pressure readings were 111/78 mmHg, but in-office readings were elevated at 142/86 mmHg in the right arm and 138/80 mmHg in the left arm. Current medications include lisinopril  20 mg daily and carvedilol  12.5 mg twice daily. Wrist cuff used at home may not provide accurate readings. Shortness of breath during exercise is attributed to deconditioning. - Increase lisinopril  to 20 mg twice daily. - Continue carvedilol  12.5 mg twice daily. - Order BMP to check renal function, specifically creatinine levels. - Schedule a follow-up appointment in two weeks for blood pressure recheck with the nurse. - Bring home blood pressure cuff to the follow-up appointment for comparison. - Check if insurance covers a new blood pressure cuff and provide one if available.  Type 2 diabetes mellitus Type 2 diabetes mellitus with persistently high blood sugar levels. Missed phone visit with clinical pharmacist for diabetes management due to phone issues. Next endocrinology appointment is in November. - Ensure follow-up with clinical pharmacist for diabetes management. - Discuss dietary management strategies to control blood sugar levels.  Deconditioning Experiencing shortness of breath during exercise, which resolves with rest. This is attributed to deconditioning due to increased physical activity.  General Health Maintenance Participation in a DNA screening for cancer risk assessment.          Continue all other maintenance medications.  Follow up plan: Return in about 2 weeks (around 02/18/2024) for nurse visit for BP and BMP.   Continue healthy lifestyle choices, including diet (rich in fruits, vegetables, and lean proteins, and low in salt and simple carbohydrates) and exercise (at least 30 minutes of  moderate physical activity daily).  Educational handout given for DASH  The above assessment and management plan was discussed with the patient. The patient verbalized understanding of and has agreed to the management plan. Patient is aware to call the clinic if they develop any new symptoms or if symptoms persist or worsen. Patient is aware when to return to the clinic for a follow-up visit. Patient educated on when it is appropriate to go to the emergency department.   Dawn Bruns, FNP-C [facility] [phone]

## 2024-02-05 ENCOUNTER — Ambulatory Visit: Payer: Self-pay | Admitting: Family Medicine

## 2024-02-10 LAB — GENECONNECT MOLECULAR SCREEN: Genetic Analysis Overall Interpretation: NEGATIVE

## 2024-02-19 ENCOUNTER — Ambulatory Visit (INDEPENDENT_AMBULATORY_CARE_PROVIDER_SITE_OTHER): Admitting: *Deleted

## 2024-02-19 VITALS — BP 123/77 | HR 78

## 2024-02-19 DIAGNOSIS — Z794 Long term (current) use of insulin: Secondary | ICD-10-CM | POA: Diagnosis not present

## 2024-02-19 DIAGNOSIS — E119 Type 2 diabetes mellitus without complications: Secondary | ICD-10-CM

## 2024-02-19 NOTE — Progress Notes (Signed)
 Patient is in office today for a nurse visit for Blood Pressure Check. Patient's blood pressure today is 123/77,78 heart rate.

## 2024-02-20 ENCOUNTER — Ambulatory Visit: Payer: Self-pay | Admitting: Family Medicine

## 2024-02-20 LAB — BMP8+EGFR
BUN/Creatinine Ratio: 23 (ref 12–28)
BUN: 18 mg/dL (ref 8–27)
CO2: 21 mmol/L (ref 20–29)
Calcium: 10 mg/dL (ref 8.7–10.3)
Chloride: 101 mmol/L (ref 96–106)
Creatinine, Ser: 0.77 mg/dL (ref 0.57–1.00)
Glucose: 203 mg/dL — ABNORMAL HIGH (ref 70–99)
Potassium: 4.5 mmol/L (ref 3.5–5.2)
Sodium: 139 mmol/L (ref 134–144)
eGFR: 82 mL/min/1.73 (ref >59)

## 2024-03-11 ENCOUNTER — Encounter: Payer: Self-pay | Admitting: Family Medicine

## 2024-03-24 ENCOUNTER — Ambulatory Visit: Payer: Self-pay

## 2024-03-24 ENCOUNTER — Ambulatory Visit (INDEPENDENT_AMBULATORY_CARE_PROVIDER_SITE_OTHER): Admitting: Family Medicine

## 2024-03-24 ENCOUNTER — Encounter: Payer: Self-pay | Admitting: Family Medicine

## 2024-03-24 VITALS — BP 142/88 | HR 80 | Temp 97.6°F | Ht 61.0 in | Wt 148.0 lb

## 2024-03-24 DIAGNOSIS — F418 Other specified anxiety disorders: Secondary | ICD-10-CM

## 2024-03-24 MED ORDER — HYDROXYZINE HCL 10 MG PO TABS: 5.0000 mg | ORAL_TABLET | Freq: Three times a day (TID) | ORAL | 0 refills | Status: DC | PRN

## 2024-03-24 NOTE — Telephone Encounter (Signed)
 Patient was seen today.

## 2024-03-24 NOTE — Progress Notes (Signed)
 Subjective:  Patient ID: Dawn Anthony, female    DOB: 26-Jun-1950, 73 y.o.   MRN: [id number]  Patient Care Team: Severa Rock HERO, FNP as PCP - General (Family Medicine) Bertrum Rosina HERO, RN as [facility] Care Management (General Practice) Bertrum Rosina HERO, RN   Chief Complaint:  Anxiety (August since patient has been having trouble with her neighbor. )   HPI: Dawn Anthony is a 73 y.o. female presenting on 03/24/2024 for Anxiety (August since patient has been having trouble with her neighbor. )   Dawn Anthony is a 73 year old female who presents with anxiety exacerbated by a legal dispute with her neighbors.  She experiences significant anxiety due to an ongoing legal dispute with her neighbors, which began in August. She describes receiving voice messages and videos from her neighbors, accusing her of making threats, which she denies. This situation has led to police involvement and upcoming court dates on October 8th and October 29th.  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 However, she cannot recall the name of the medication.  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 She also reports being diabetic and occasionally having high blood pressure.          Relevant past medical, surgical, family, and social history reviewed and updated as indicated.  Allergies and medications reviewed and updated. Data reviewed: Chart in Epic.   Past Medical History:  Diagnosis Date   Diabetes mellitus without complication (HCC)    Gastritis    Hyperlipidemia    Hypertension    Thyroid  disease     Past Surgical History:  Procedure Laterality Date   CORONARY ARTERY BYPASS GRAFT  2022   EYE SURGERY     KNEE SURGERY Bilateral    OTHER SURGICAL HISTORY     shoulder surgery   THYROID   SURGERY      Social History   Socioeconomic History   Marital status: Widowed    Spouse name: Not on file   Number of children: 1   Years of education: Not on file   Highest education level: 12th grade  Occupational History   Not on file  Tobacco Use   Smoking status: Never   Smokeless tobacco: Never  Vaping Use   Vaping status: Never Used  Substance and Sexual Activity   Alcohol use: Never   Drug use: Never   Sexual activity: Not Currently  Other Topics Concern   Not on file  Social History Narrative   Grandson lives with her.    Social Drivers of Corporate investment banker Strain: Low Risk  (12/30/2023)   Overall Financial Resource Strain (CARDIA)    Difficulty of Paying Living Expenses: Not hard at all  Food Insecurity: Food Insecurity Present (12/30/2023)   Hunger Vital Sign    Worried About Running Out of Food in the Last Year: Never true    Ran Out of Food in the Last Year: Sometimes true  Transportation Needs: No Transportation Needs (12/30/2023)   PRAPARE - Administrator, Civil Service (Medical): No    Lack of Transportation (Non-Medical): No  Physical Activity: Insufficiently Active (12/30/2023)   Exercise Vital Sign    Days of Exercise per Week: 3 days    Minutes of Exercise per Session: 30  min  Stress: No Stress Concern Present (12/30/2023)   [employer] of Occupational Health - Occupational Stress Questionnaire    Feeling of Stress: Not at all  Social Connections: Moderately Isolated (12/30/2023)   Social Connection and Isolation Panel    Frequency of Communication with Friends and Family: More than three times a week    Frequency of Social Gatherings with Friends and Family: More than three times a week    Attends Religious Services: 1 to 4 times per year    Active Member of [employer] or Organizations: No    Attends Banker Meetings: Not on file    Marital Status: Widowed  Intimate Partner Violence: Not At Risk (12/29/2023)    Humiliation, Afraid, Rape, and Kick questionnaire    Fear of Current or Ex-Partner: No    Emotionally Abused: No    Physically Abused: No    Sexually Abused: No    Outpatient Encounter Medications as of 03/24/2024  Medication Sig   aspirin EC 81 MG tablet Take 81 mg by mouth daily. Swallow whole.   Blood Pressure Monitoring (BLOOD PRESSURE MONITOR AUTOMAT) DEVI 1 Device by Does not apply route in the morning and at bedtime.   Calcium-Magnesium-Vitamin D  (CALCIUM 1200+D3 PO) Take 1 tablet by mouth daily.   carvedilol  (COREG ) 12.5 MG tablet Take 1 tablet (12.5 mg total) by mouth 2 (two) times daily with a meal.   cholecalciferol  (VITAMIN D3) 25 MCG (1000 UT) tablet Take 50 Units by mouth daily.   Continuous Glucose Sensor (FREESTYLE LIBRE 3 PLUS SENSOR) MISC Change sensor every 15 days.   hydrOXYzine (ATARAX) 10 MG tablet Take 0.5-1 tablets (5-10 mg total) by mouth 3 (three) times daily as needed.   insulin  lispro (HUMALOG  KWIKPEN) 100 UNIT/ML KwikPen Inject 8-14 Units into the skin 3 (three) times daily.   Insulin  Pen Needle (BD PEN NEEDLE NANO U/F) 32G X 4 MM MISC UAD QID Dx E11.69   levothyroxine  (SYNTHROID ) 75 MCG tablet Take 1 tablet (75 mcg total) by mouth daily before breakfast.   lisinopril  (ZESTRIL ) 20 MG tablet Take 1 tablet (20 mg total) by mouth 2 (two) times daily.   metFORMIN  (GLUCOPHAGE ) 1000 MG tablet Take 1 tablet (1,000 mg total) by mouth 2 (two) times daily with a meal.   rosuvastatin (CRESTOR) 20 MG tablet TAKE 1 TABLET BY MOUTH EVERYDAY AT BEDTIME   insulin  degludec (TRESIBA  FLEXTOUCH) 100 UNIT/ML FlexTouch Pen Inject 25 Units into the skin at bedtime. (Patient not taking: Reported on 03/24/2024)   No facility-administered encounter medications on file as of 03/24/2024.    Allergies  Allergen Reactions   Hydrochlorothiazide     Other Reaction(s): Unknown-Unspecified  Stopped due to pancreatitis   Insulin  Glargine-Lixisenatide Nausea And Vomiting   Simvastatin      Other Reaction(s): Unknown-Unspecified  Stopped due to pancreatitis  Stopped due to pancreatitis   Penicillins Hives   Amoxicillin Itching, Rash and Dermatitis   Cat Dander Itching, Rash and Dermatitis   Latex Other (See Comments), Rash and Dermatitis    Pertinent ROS per HPI, otherwise unremarkable      Objective:  BP (!) 142/88   Pulse 80   Temp 97.6 F (36.4 C)   Ht 5' 1 (1.549 m)   Wt 148 lb (67.1 kg)   SpO2 95%   BMI 27.96 kg/m    Wt Readings from Last 3 Encounters:  03/24/24 148 lb (67.1 kg)  02/04/24 149 lb 12.8 oz (67.9 kg)  12/31/23 147 lb  9.6 oz (67 kg)    Physical Exam Vitals reviewed.  Constitutional:      General: She is not in acute distress.    Appearance: Normal appearance. She is not ill-appearing, toxic-appearing or diaphoretic.  HENT:     Head: Normocephalic and atraumatic.     Mouth/Throat:     Mouth: Mucous membranes are moist.  Eyes:     Pupils: Pupils are equal, round, and reactive to light.  Cardiovascular:     Rate and Rhythm: Normal rate and regular rhythm.     Heart sounds: Normal heart sounds.  Pulmonary:     Effort: Pulmonary effort is normal.     Breath sounds: Normal breath sounds.  Skin:    General: Skin is warm and dry.     Capillary Refill: Capillary refill takes less than 2 seconds.  Neurological:     General: No focal deficit present.     Mental Status: She is alert and oriented to person, place, and time.  Psychiatric:        Attention and Perception: Attention and perception normal.        Mood and Affect: Mood is anxious. Affect is tearful.        Speech: Speech normal.        Behavior: Behavior is cooperative.        Thought Content: Thought content normal.        Cognition and Memory: Cognition and memory normal.        Judgment: Judgment normal.     Results for orders placed or performed in visit on 02/19/24  BMP8+EGFR   Collection Time: 02/19/24  9:58 AM  Result Value Ref Range   Glucose 203 (H) 70 - 99  mg/dL   BUN 18 8 - 27 mg/dL   Creatinine, Ser 9.22 0.57 - 1.00 mg/dL   eGFR 82 >40 fO/fpw/8.26   BUN/Creatinine Ratio 23 12 - 28   Sodium 139 134 - 144 mmol/L   Potassium 4.5 3.5 - 5.2 mmol/L   Chloride 101 96 - 106 mmol/L   CO2 21 20 - 29 mmol/L   Calcium 10.0 8.7 - 10.3 mg/dL       Pertinent labs & imaging results that were available during my care of the patient were reviewed by me and considered in my medical decision making.  Assessment & Plan:  Madisson was seen today for anxiety.  Diagnoses and all orders for this visit:  Situational anxiety -     hydrOXYzine (ATARAX) 10 MG tablet; Take 0.5-1 tablets (5-10 mg total) by mouth 3 (three) times daily as needed.     Situational anxiety Experiencing situational anxiety due to ongoing legal issues with a neighbor, exacerbated by legal proceedings and lack of evidence supporting neighbor's claims. Anxiety is acute and related to specific stressors. - Prescribed hydroxyzine, half to one tablet as needed, up to three times a day, with caution regarding sedation. - Advised on deep breathing techniques and stress management. - Instructed to contact police if further harassment occurs.     Continue all other maintenance medications.  Follow up plan: Return if symptoms worsen or fail to improve.   Continue healthy lifestyle choices, including diet (rich in fruits, vegetables, and lean proteins, and low in salt and simple carbohydrates) and exercise (at least 30 minutes of moderate physical activity daily).   The above assessment and management plan was discussed with the patient. The patient verbalized understanding of and has agreed to the management plan. Patient  is aware to call the clinic if they develop any new symptoms or if symptoms persist or worsen. Patient is aware when to return to the clinic for a follow-up visit. Patient educated on when it is appropriate to go to the emergency department.   Rosaline Bruns,  FNP-C [facility] [phone]

## 2024-03-24 NOTE — Telephone Encounter (Signed)
 FYI Only or Action Required?: FYI only for provider.  Patient was last seen in primary care on 02/04/2024 by Severa Rock HERO, FNP.  Called Nurse Triage reporting Anxiety.  Symptoms began a couple of months ago.  Symptoms are: rapidly worsening.  Triage Disposition: See Physician Within 24 Hours  Patient/caregiver understands and will follow disposition?: Yes      Copied from CRM #[id number]. Topic: Clinical - Red Word Triage >> Mar 24, 2024  2:55 PM Carlatta H wrote: [facility] that prompted transfer to Nurse Triage: Patient is very upset and crying dealing with anxiety//She has not been able to sleep//       Reason for Disposition  Patient sounds very upset or troubled to the triager  Answer Assessment - Initial Assessment Questions Patient reports she is 2-3 minutes away from the office. Appointment scheduled for 3:05 pm today. CAL called and notified of patient's approximate ETA.    1. CONCERN: Did anything happen that prompted you to call today?      Increased anxiety  2. ANXIETY SYMPTOMS: Can you describe how you (your loved one; patient) have been feeling? (e.g., tense, restless, panicky, anxious, keyed up, overwhelmed, sense of impending doom).      Anxious, overwhelmed  3. ONSET: How long have you been feeling this way? (e.g., hours, days, weeks)     A couple of months, worse over the last couple of days  4. SEVERITY: How would you rate the level of anxiety? (e.g., 0 - 10; or mild, moderate, severe).     Moderate to severe  5. FUNCTIONAL IMPAIRMENT: How have these feelings affected your ability to do daily activities? Have you had more difficulty than usual doing your normal daily activities? (e.g., getting better, same, worse; self-care, school, work, interactions)     Having difficulty sleeping  6. HISTORY: Have you felt this way before? Have you ever been diagnosed with an anxiety problem in the past? (e.g., generalized anxiety disorder, panic attacks,  PTSD). If Yes, ask: How was this problem treated? (e.g., medicines, counseling, etc.)     Has had anxiety in the past and was on a medication that helped  7. RISK OF HARM - SUICIDAL IDEATION: Do you ever have thoughts of hurting or killing yourself? If Yes, ask:  Do you have these feelings now? Do you have a plan on how you would do this?     No 8. TREATMENT:  What has been done so far to treat this anxiety? (e.g., medicines, relaxation strategies). What has helped?     No 9. THERAPIST: Do you have a counselor or therapist? If Yes, ask: What is their name?     No 10. POTENTIAL TRIGGERS: Do you drink caffeinated beverages (e.g., coffee, colas, teas), and how much daily? Do you drink alcohol or use any drugs? Have you started any new medicines recently?       Having problems with people filing police reports against her 21. PATIENT SUPPORT: Who is with you now? Who do you live with? Do you have family or friends who you can talk to?        Family  12. OTHER SYMPTOMS: Do you have any other symptoms? (e.g., feeling depressed, trouble concentrating, trouble sleeping, trouble breathing, palpitations or fast heartbeat, chest pain, sweating, nausea, or diarrhea)       Trouble sleeping  Protocols used: Anxiety and Panic Attack-A-AH

## 2024-04-05 ENCOUNTER — Other Ambulatory Visit: Payer: Self-pay | Admitting: Family Medicine

## 2024-04-13 ENCOUNTER — Encounter: Payer: Self-pay | Admitting: Family Medicine

## 2024-04-13 NOTE — Progress Notes (Signed)
 Pharmacy Quality Measure Review  This patient is appearing on a report for being at risk of failing the adherence measure for cholesterol (statin) medications this calendar year.   Medication: Atorvastatin 20mg  Last fill date: 07/18 for 90 day supply  Insurance report was not up to date. No action needed at this time. Takes rosuvastatin 20mg , LF 10.13.25 90DS. Atorvastatin is not active on her medication list.  Jon Piety, PharmD [facility] Pharmacist

## 2024-04-19 ENCOUNTER — Telehealth: Payer: Self-pay | Admitting: Nurse Practitioner

## 2024-04-19 ENCOUNTER — Other Ambulatory Visit: Payer: Self-pay | Admitting: Nurse Practitioner

## 2024-04-19 DIAGNOSIS — Z794 Long term (current) use of insulin: Secondary | ICD-10-CM

## 2024-04-19 DIAGNOSIS — E890 Postprocedural hypothyroidism: Secondary | ICD-10-CM

## 2024-04-19 NOTE — Telephone Encounter (Signed)
 Does pt need labs, don't see any entered or she may have done them early

## 2024-04-19 NOTE — Telephone Encounter (Signed)
 Yes, she did them early.  I put new ones in for this visit.  CMP and thyroid  labs.

## 2024-04-21 ENCOUNTER — Other Ambulatory Visit

## 2024-04-21 DIAGNOSIS — E890 Postprocedural hypothyroidism: Secondary | ICD-10-CM | POA: Diagnosis not present

## 2024-04-21 DIAGNOSIS — E1165 Type 2 diabetes mellitus with hyperglycemia: Secondary | ICD-10-CM | POA: Diagnosis not present

## 2024-04-21 DIAGNOSIS — Z794 Long term (current) use of insulin: Secondary | ICD-10-CM | POA: Diagnosis not present

## 2024-04-22 LAB — COMPREHENSIVE METABOLIC PANEL WITH GFR
ALT: 10 IU/L (ref 0–32)
AST: 12 IU/L (ref 0–40)
Albumin: 4.2 g/dL (ref 3.8–4.8)
Alkaline Phosphatase: 80 IU/L (ref 49–135)
BUN/Creatinine Ratio: 19 (ref 12–28)
BUN: 15 mg/dL (ref 8–27)
Bilirubin Total: 0.5 mg/dL (ref 0.0–1.2)
CO2: 21 mmol/L (ref 20–29)
Calcium: 9.4 mg/dL (ref 8.7–10.3)
Chloride: 96 mmol/L (ref 96–106)
Creatinine, Ser: 0.79 mg/dL (ref 0.57–1.00)
Globulin, Total: 1.9 g/dL (ref 1.5–4.5)
Sodium: 135 mmol/L (ref 134–144)
Total Protein: 6.1 g/dL (ref 6.0–8.5)
eGFR: 80 mL/min/1.73 (ref >59)

## 2024-04-22 LAB — T4, FREE: Free T4: 1.78 ng/dL — ABNORMAL HIGH (ref 0.82–1.77)

## 2024-04-22 LAB — TSH: TSH: 0.976 u[IU]/mL (ref 0.450–4.500)

## 2024-04-26 ENCOUNTER — Ambulatory Visit: Admitting: Nurse Practitioner

## 2024-04-26 ENCOUNTER — Encounter: Payer: Self-pay | Admitting: Nurse Practitioner

## 2024-04-26 VITALS — BP 128/80 | HR 67 | Ht 61.0 in | Wt 146.0 lb

## 2024-04-26 DIAGNOSIS — Z7984 Long term (current) use of oral hypoglycemic drugs: Secondary | ICD-10-CM | POA: Diagnosis not present

## 2024-04-26 DIAGNOSIS — E890 Postprocedural hypothyroidism: Secondary | ICD-10-CM | POA: Diagnosis not present

## 2024-04-26 DIAGNOSIS — E782 Mixed hyperlipidemia: Secondary | ICD-10-CM | POA: Diagnosis not present

## 2024-04-26 DIAGNOSIS — I10 Essential (primary) hypertension: Secondary | ICD-10-CM

## 2024-04-26 DIAGNOSIS — Z794 Long term (current) use of insulin: Secondary | ICD-10-CM

## 2024-04-26 DIAGNOSIS — E1165 Type 2 diabetes mellitus with hyperglycemia: Secondary | ICD-10-CM

## 2024-04-26 LAB — POCT GLYCOSYLATED HEMOGLOBIN (HGB A1C): Hemoglobin A1C: 13 % — AB (ref 4.0–5.6)

## 2024-04-26 MED ORDER — INSULIN LISPRO (1 UNIT DIAL) 100 UNIT/ML (KWIKPEN): 8.0000 [IU] | PEN_INJECTOR | Freq: Three times a day (TID) | SUBCUTANEOUS | 3 refills | Status: AC

## 2024-04-26 MED ORDER — METFORMIN HCL 1000 MG PO TABS: 1000.0000 mg | ORAL_TABLET | Freq: Two times a day (BID) | ORAL | 3 refills | Status: AC

## 2024-04-26 MED ORDER — FREESTYLE LIBRE 3 PLUS SENSOR MISC: 3 refills | Status: AC

## 2024-04-26 MED ORDER — LEVOTHYROXINE SODIUM 75 MCG PO TABS: 75.0000 ug | ORAL_TABLET | Freq: Every day | ORAL | 1 refills | Status: AC

## 2024-04-26 MED ORDER — ACCU-CHEK GUIDE TEST VI STRP: ORAL_STRIP | 12 refills | Status: AC

## 2024-04-26 MED ORDER — ACCU-CHEK SOFTCLIX LANCETS MISC: 12 refills | Status: AC

## 2024-04-26 MED ORDER — TRESIBA FLEXTOUCH 100 UNIT/ML ~~LOC~~ SOPN: 25.0000 [IU] | PEN_INJECTOR | Freq: Every day | SUBCUTANEOUS | 3 refills | Status: AC

## 2024-04-26 NOTE — Progress Notes (Signed)
 Endocrinology Follow Up Note       04/26/2024, 11:28 AM   Subjective:    Patient ID: Dawn Anthony, female    DOB: 12-25-1950.  Dawn Anthony is being seen in follow up after being seen in consultation for management of currently uncontrolled symptomatic diabetes requested by  Severa Rock HERO, FNP.   Past Medical History:  Diagnosis Date   Diabetes mellitus without complication (HCC)    Gastritis    Hyperlipidemia    Hypertension    Thyroid  disease     Past Surgical History:  Procedure Laterality Date   CORONARY ARTERY BYPASS GRAFT  2022   EYE SURGERY     KNEE SURGERY Bilateral    OTHER SURGICAL HISTORY     shoulder surgery   THYROID  SURGERY      Social History   Socioeconomic History   Marital status: Widowed    Spouse name: Not on file   Number of children: 1   Years of education: Not on file   Highest education level: 12th grade  Occupational History   Not on file  Tobacco Use   Smoking status: Never   Smokeless tobacco: Never  Vaping Use   Vaping status: Never Used  Substance and Sexual Activity   Alcohol use: Never   Drug use: Never   Sexual activity: Not Currently  Other Topics Concern   Not on file  Social History Narrative   Grandson lives with her.    Social Drivers of Corporate Investment Banker Strain: Low Risk  (12/30/2023)   Overall Financial Resource Strain (CARDIA)    Difficulty of Paying Living Expenses: Not hard at all  Food Insecurity: Food Insecurity Present (12/30/2023)   Hunger Vital Sign    Worried About Running Out of Food in the Last Year: Never true    Ran Out of Food in the Last Year: Sometimes true  Transportation Needs: No Transportation Needs (12/30/2023)   PRAPARE - Administrator, Civil Service (Medical): No    Lack of Transportation (Non-Medical): No  Physical Activity: Insufficiently Active (12/30/2023)   Exercise Vital Sign    Days of  Exercise per Week: 3 days    Minutes of Exercise per Session: 30 min  Stress: No Stress Concern Present (12/30/2023)   [employer] of Occupational Health - Occupational Stress Questionnaire    Feeling of Stress: Not at all  Social Connections: Moderately Isolated (12/30/2023)   Social Connection and Isolation Panel    Frequency of Communication with Friends and Family: More than three times a week    Frequency of Social Gatherings with Friends and Family: More than three times a week    Attends Religious Services: 1 to 4 times per year    Active Member of [employer] or Organizations: No    Attends Banker Meetings: Not on file    Marital Status: Widowed    Family History  Problem Relation Age of Onset   Diabetes Mother    Heart disease Mother    Heart failure Mother    Heart attack Father    Heart disease Father    COPD Father    Diabetes Father  Dementia Sister    Heart attack Sister    Hypertension Sister    Diabetes Brother    Heart attack Brother    Alcohol abuse Brother    Cervical cancer Maternal Grandmother    Diabetes Paternal Grandfather     Outpatient Encounter Medications as of 04/26/2024  Medication Sig   Accu-Chek Softclix Lancets lancets Use as instructed to monitor glucose 4 times daily   aspirin EC 81 MG tablet Take 81 mg by mouth daily. Swallow whole.   Blood Pressure Monitoring (BLOOD PRESSURE MONITOR AUTOMAT) DEVI 1 Device by Does not apply route in the morning and at bedtime.   Calcium-Magnesium-Vitamin D  (CALCIUM 1200+D3 PO) Take 1 tablet by mouth daily.   carvedilol  (COREG ) 12.5 MG tablet Take 1 tablet (12.5 mg total) by mouth 2 (two) times daily with a meal.   cholecalciferol  (VITAMIN D3) 25 MCG (1000 UT) tablet Take 50 Units by mouth daily.   glucose blood (ACCU-CHEK GUIDE TEST) test strip Use as instructed to monitor glucose 4 times daily   Insulin  Pen Needle (BD PEN NEEDLE NANO U/F) 32G X 4 MM MISC UAD QID Dx E11.69   lisinopril   (ZESTRIL ) 20 MG tablet Take 1 tablet (20 mg total) by mouth 2 (two) times daily.   rosuvastatin (CRESTOR) 20 MG tablet TAKE 1 TABLET BY MOUTH EVERYDAY AT BEDTIME   [DISCONTINUED] Continuous Glucose Sensor (FREESTYLE LIBRE 3 PLUS SENSOR) MISC Change sensor every 15 days.   [DISCONTINUED] insulin  degludec (TRESIBA  FLEXTOUCH) 100 UNIT/ML FlexTouch Pen Inject 25 Units into the skin at bedtime.   [DISCONTINUED] insulin  lispro (HUMALOG  KWIKPEN) 100 UNIT/ML KwikPen Inject 8-14 Units into the skin 3 (three) times daily.   [DISCONTINUED] levothyroxine  (SYNTHROID ) 75 MCG tablet Take 1 tablet (75 mcg total) by mouth daily before breakfast.   [DISCONTINUED] metFORMIN  (GLUCOPHAGE ) 1000 MG tablet Take 1 tablet (1,000 mg total) by mouth 2 (two) times daily with a meal.   Continuous Glucose Sensor (FREESTYLE LIBRE 3 PLUS SENSOR) MISC Change sensor every 15 days.   hydrOXYzine (ATARAX) 10 MG tablet Take 0.5-1 tablets (5-10 mg total) by mouth 3 (three) times daily as needed.   insulin  degludec (TRESIBA  FLEXTOUCH) 100 UNIT/ML FlexTouch Pen Inject 25 Units into the skin at bedtime.   insulin  lispro (HUMALOG  KWIKPEN) 100 UNIT/ML KwikPen Inject 8-14 Units into the skin 3 (three) times daily.   levothyroxine  (SYNTHROID ) 75 MCG tablet Take 1 tablet (75 mcg total) by mouth daily before breakfast.   metFORMIN  (GLUCOPHAGE ) 1000 MG tablet Take 1 tablet (1,000 mg total) by mouth 2 (two) times daily with a meal.   No facility-administered encounter medications on file as of 04/26/2024.    ALLERGIES: Allergies  Allergen Reactions   Hydrochlorothiazide     Other Reaction(s): Unknown-Unspecified  Stopped due to pancreatitis   Insulin  Glargine-Lixisenatide Nausea And Vomiting   Simvastatin     Other Reaction(s): Unknown-Unspecified  Stopped due to pancreatitis  Stopped due to pancreatitis   Penicillins Hives   Amoxicillin Itching, Rash and Dermatitis   Cat Dander Itching, Rash and Dermatitis   Latex Other (See  Comments), Rash and Dermatitis    VACCINATION STATUS: Immunization History  Administered Date(s) Administered   Fluad Trivalent(High Dose 65+) 07/03/2023   INFLUENZA, HIGH DOSE SEASONAL PF 03/08/2021, 03/08/2021   Influenza Split 04/24/2013, 03/07/2017, 03/27/2018   Influenza, Quadrivalent, Recombinant, Inj, Pf 03/08/2021   Influenza, Seasonal, Injecte, Preservative Fre 04/27/2015, 02/28/2017   Influenza,inj,Quad PF,6+ Mos 04/02/2020   Influenza,trivalent, recombinat, inj, PF 04/24/2013, 03/07/2017, 03/27/2018  Influenza-Unspecified 04/27/2015, 02/28/2017, 03/08/2021   Moderna Sars-Covid-2 Vaccination 11/01/2019, 12/09/2019   PFIZER(Purple Top)SARS-COV-2 Vaccination 06/13/2020   Pneumococcal Conjugate-13 04/07/2018   Pneumococcal Polysaccharide-23 11/21/2007, 10/25/2019   Tdap 12/10/2006, 04/24/2017   Zoster, Live 03/22/2013    Diabetes She presents for her follow-up diabetic visit. Onset time: diagnosed at approx age of 65. Her disease course has been worsening. There are no hypoglycemic associated symptoms. Associated symptoms include polydipsia, polyuria and weight loss. There are no hypoglycemic complications. 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 Risk factors for coronary artery disease include diabetes mellitus, dyslipidemia, family history, hypertension, sedentary lifestyle and post-menopausal. Current diabetic treatment includes intensive insulin  program and oral agent (monotherapy). She is compliant with treatment some of the time (has been without a sensor for 2+ weeks thus has not taken her insulin ). Her weight is decreasing steadily. She is following a generally unhealthy diet. When asked about meal planning, she reported none. She has not had a previous visit with a dietitian. She participates in exercise intermittently. (She presents today with her CGM showing no data since October 17, she ran out and does not have back up meter.  Her POCT A1c  today is 13%, increasing from last visit of 10%.   She has not taken her insulin  in last few weeks as she has not had a way to check her glucose.  She also notes she has been under great amount of family related stress.) An ACE inhibitor/angiotensin II receptor blocker is being taken. She does not see a podiatrist.Eye exam is current.    Review of systems  Constitutional: +decreasing body weight,  current Body mass index is 27.59 kg/m. , no fatigue, no subjective hyperthermia, no subjective hypothermia Eyes: no blurry vision, no xerophthalmia ENT: no sore throat, no nodules palpated in throat, no dysphagia/odynophagia, no hoarseness Cardiovascular: no chest pain, no shortness of breath, no palpitations, no leg swelling Respiratory: no cough, no shortness of breath Gastrointestinal: no nausea/vomiting/diarrhea Musculoskeletal: no muscle/joint aches Skin: no rashes, no hyperemia Neurological: no tremors, no numbness, no tingling, no dizziness Psychiatric: no depression, no anxiety  Objective:     BP 128/80 (BP Location: Left Arm, Patient Position: Sitting, Cuff Size: Large)   Pulse 67   Ht 5' 1 (1.549 m)   Wt 146 lb (66.2 kg)   BMI 27.59 kg/m   Wt Readings from Last 3 Encounters:  04/26/24 146 lb (66.2 kg)  03/24/24 148 lb (67.1 kg)  02/04/24 149 lb 12.8 oz (67.9 kg)     BP Readings from Last 3 Encounters:  04/26/24 128/80  03/24/24 (!) 142/88  02/19/24 123/77      Physical Exam- Limited  Constitutional:  Body mass index is 27.59 kg/m. , not in acute distress, normal state of mind Eyes:  EOMI, no exophthalmos Musculoskeletal: no gross deformities, strength intact in all four extremities, no gross restriction of joint movements Skin:  no rashes, no hyperemia Neurological: no tremor with outstretched hands   Diabetic Foot Exam - Simple   No data filed     CMP ( most recent) CMP     Component Value Date/Time   NA 135 04/21/2024 1353   K CANCELED 04/21/2024 1353    CL 96 04/21/2024 1353   CO2 21 04/21/2024 1353   GLUCOSE CANCELED 04/21/2024 1353   GLUCOSE 405 (H) 06/28/2023 1620   BUN 15 04/21/2024 1353   CREATININE 0.79 04/21/2024 1353   CALCIUM 9.4 04/21/2024 1353   PROT 6.1 04/21/2024 1353  ALBUMIN 4.2 04/21/2024 1353   AST 12 04/21/2024 1353   ALT 10 04/21/2024 1353   ALKPHOS 80 04/21/2024 1353   BILITOT 0.5 04/21/2024 1353   GFRNONAA >60 06/28/2023 1620   GFRAA >60 07/09/2019 1456     Diabetic Labs (most recent): Lab Results  Component Value Date   HGBA1C 13.0 (A) 04/26/2024   HGBA1C 10.0 (A) 12/24/2023   HGBA1C 10.7 (H) 07/03/2023   MICROALBUR 80mg /L 12/24/2023     Lipid Panel ( most recent) Lipid Panel     Component Value Date/Time   CHOL 149 12/31/2023 0900   TRIG 75 12/31/2023 0900   HDL 75 12/31/2023 0900   CHOLHDL 2.0 12/31/2023 0900   LDLCALC 59 12/31/2023 0900   LABVLDL 15 12/31/2023 0900      Lab Results  Component Value Date   TSH 0.976 04/21/2024   TSH 1.110 12/31/2023   TSH 2.010 07/03/2023   TSH 1.080 02/05/2023   TSH 1.530 08/28/2022   FREET4 1.78 (H) 04/21/2024   FREET4 1.62 12/31/2023   FREET4 1.74 02/05/2023           Assessment & Plan:   1) Type 2 diabetes mellitus with hyperglycemia, with long-term current use of insulin   She presents today with her CGM showing no data since October 17, she ran out and does not have back up meter.  Her POCT A1c today is 13%, increasing from last visit of 10%.   She has not taken her insulin  in last few weeks as she has not had a way to check her glucose.  She also notes she has been under great amount of family related stress.  - Dawn Anthony has currently uncontrolled symptomatic type 2 DM since 73 years of age.   -Recent labs reviewed.   - I had a long discussion with her about the progressive nature of diabetes and the pathology behind its complications. -her diabetes is complicated by CAD with CABG and she remains at a high risk for more  acute and chronic complications which include CAD, CVA, CKD, retinopathy, and neuropathy. These are all discussed in detail with her.  The following Lifestyle Medicine recommendations according to American College of Lifestyle Medicine [facility]) were discussed and offered to patient and she agrees to start the journey:  A. Whole Foods, Plant-based plate comprising of fruits and vegetables, plant-based proteins, whole-grain carbohydrates was discussed in detail with the patient.   A list for source of those nutrients were also provided to the patient.  Patient will use only water or unsweetened tea for hydration. B.  The need to stay away from risky substances including alcohol, smoking; obtaining 7 to 9 hours of restorative sleep, at least 150 minutes of moderate intensity exercise weekly, the importance of healthy social connections,  and stress reduction techniques were discussed. C.  A full color page of  Calorie density of various food groups per pound showing examples of each food groups was provided to the patient.  - Nutritional counseling repeated/built upon at each appointment.  - The patient admits there is a room for improvement in their diet and drink choices. -  Suggestion is made for the patient to avoid simple carbohydrates from their diet including Cakes, Sweet Desserts / Pastries, Ice Cream, Soda (diet and regular), Sweet Tea, Candies, Chips, Cookies, Sweet Pastries, Store Bought Juices, Alcohol in Excess of 1-2 drinks a day, Artificial Sweeteners, Coffee Creamer, and Sugar-free Products. This will help patient to have stable blood glucose profile and potentially  avoid unintended weight gain.   - I encouraged the patient to switch to unprocessed or minimally processed complex starch and increased protein intake (animal or plant source), fruits, and vegetables.   - Patient is advised to stick to a routine mealtimes to eat 3 meals a day and avoid unnecessary snacks (to snack only to  correct hypoglycemia).  - I have approached her with the following individualized plan to manage her diabetes and patient agrees:   -Given the lack of glucose readings, no changes can be made to her regimen today for safety purposes.  Instead, she is advised to restart Tresiba  25 units SQ nightly and Humalog  8-14 units TID with meals if glucose is above 90 and she is eating (Specific instructions on how to titrate insulin  dosage based on glucose readings given to patient in writing).   She can continue her Metformin  1000 mg po twice daily with meals.    -she is encouraged to start monitoring glucose 4 times daily (using her CGM), before meals and before bed, and to call the clinic if she has readings less than 70 or above 300 for 3 tests in a row.  I did give her 2 sample sensors to hold her over and sent in new script to the pharmacy.  I also gave her a sample standard glucose meter to use as back up to the CGM.  - she is warned not to take insulin  without proper monitoring per orders. - Adjustment parameters are given to her for hypo and hyperglycemia in writing.  - her Jardiance was previously discontinued to de-escalate her treatment regimen.  - she is not ideal candidate for incretin therapy due to body habitus.   - Specific targets for  A1c; LDL, HDL, and Triglycerides were discussed with the patient.  2) Blood Pressure /Hypertension:  her blood pressure is controlled to target.   she is advised to continue her current medications as prescribed by her PCP.  3) Lipids/Hyperlipidemia:    Review of her recent lipid panel from 12/31/23 showed controlled LDL at 59.  she is advised to continue Crestor 20 mg daily at bedtime.  Side effects and precautions discussed with her.  4)  Weight/Diet:  her Body mass index is 27.59 kg/m.  -  she is NOT a candidate for weight loss.   Exercise, and detailed carbohydrates information provided  -  detailed on discharge instructions.  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    Her previsit TFTs are consistent with appropriate hormone replacement (Free T4 marginally increased but acceptable for the upcoming cold months and she also denies any overt symptoms of over-replacement).  She is currently on Levothyroxine  75 mcg po daily before breakfast she is advised to continue.  Will recheck prior to next visit and adjust dose accordingly.   - The correct intake of thyroid  hormone (Levothyroxine , Synthroid ), is on empty stomach first thing in the morning, with water, separated by at least 30 minutes from breakfast and other medications,  and separated by more than 4 hours from calcium, iron, multivitamins, acid reflux medications (PPIs).  - This medication is a life-long medication and will be needed to correct thyroid  hormone imbalances for the rest of your life.  The dose may change from time to time, based on thyroid  blood work.  - It is extremely important to be consistent taking this medication, near the same time each morning.  -AVOID TAKING PRODUCTS CONTAINING BIOTIN (commonly found in  Hair, Skin, Nails vitamins) AS IT INTERFERES WITH THE VALIDITY OF THYROID  FUNCTION BLOOD TESTS.  6) Chronic Care/Health Maintenance: -she is on ACEI/ARB and Statin medications and is encouraged to initiate and continue to follow up with Ophthalmology, Dentist, Podiatrist at least yearly or according to recommendations, and advised to stay away from smoking. I have recommended yearly flu vaccine and pneumonia vaccine at least every 5 years; moderate intensity exercise for up to 150 minutes weekly; and sleep for at least 7 hours a day.  - she is advised to maintain close follow up with Rakes, Rock HERO, FNP for primary care needs, as well as her other providers for optimal and coordinated care.     I spent  51  minutes in the care of the patient today including review of labs from CMP, Lipids, Thyroid  Function,  Hematology (current and previous including abstractions from other facilities); face-to-face time discussing  her blood glucose readings/logs, discussing hypoglycemia and hyperglycemia episodes and symptoms, medications doses, her options of short and long term treatment based on the latest standards of care / guidelines;  discussion about incorporating lifestyle medicine;  and documenting the encounter. Risk reduction counseling performed per USPSTF guidelines to reduce obesity and cardiovascular risk factors.     Please refer to Patient Instructions for Blood Glucose Monitoring and Insulin /Medications Dosing Guide  in media tab for additional information. Please  also refer to  Patient Self Inventory in the Media  tab for reviewed elements of pertinent patient history.  Dawn Anthony participated in the discussions, expressed understanding, and voiced agreement with the above plans.  All questions were answered to her satisfaction. she is encouraged to contact clinic should she have any questions or concerns prior to her return visit.     Follow up plan: - Return in about 3 months (around 07/27/2024) for Diabetes F/U with A1c in office, Thyroid  follow up, Previsit labs, Bring meter and logs.   Benton Rio, [facility] [address] [place], [place] [address] Phone: [phone] Fax: [phone]  04/26/2024, 11:28 AM

## 2024-04-30 ENCOUNTER — Encounter: Payer: Self-pay | Admitting: Nurse Practitioner

## 2024-05-07 NOTE — Progress Notes (Signed)
 Marlyss Cissell                                          MRN: [id number]   05/07/2024   The [facility] Quality Team Specialist reviewed this patient medical record for the purposes of chart review for care gap closure. The following were reviewed: chart review for care gap closure-glycemic status assessment.    [facility] Quality Team

## 2024-05-11 ENCOUNTER — Encounter: Payer: Self-pay | Admitting: Family Medicine

## 2024-05-11 ENCOUNTER — Ambulatory Visit (INDEPENDENT_AMBULATORY_CARE_PROVIDER_SITE_OTHER): Payer: Self-pay | Admitting: Family Medicine

## 2024-05-11 VITALS — BP 148/82 | HR 72 | Temp 97.2°F | Ht 61.0 in | Wt 147.8 lb

## 2024-05-11 DIAGNOSIS — E785 Hyperlipidemia, unspecified: Secondary | ICD-10-CM

## 2024-05-11 DIAGNOSIS — E119 Type 2 diabetes mellitus without complications: Secondary | ICD-10-CM

## 2024-05-11 DIAGNOSIS — I152 Hypertension secondary to endocrine disorders: Secondary | ICD-10-CM | POA: Diagnosis not present

## 2024-05-11 DIAGNOSIS — E1169 Type 2 diabetes mellitus with other specified complication: Secondary | ICD-10-CM | POA: Diagnosis not present

## 2024-05-11 DIAGNOSIS — Z794 Long term (current) use of insulin: Secondary | ICD-10-CM | POA: Diagnosis not present

## 2024-05-11 DIAGNOSIS — Z951 Presence of aortocoronary bypass graft: Secondary | ICD-10-CM

## 2024-05-11 DIAGNOSIS — Z23 Encounter for immunization: Secondary | ICD-10-CM | POA: Diagnosis not present

## 2024-05-11 DIAGNOSIS — E1159 Type 2 diabetes mellitus with other circulatory complications: Secondary | ICD-10-CM

## 2024-05-11 MED ORDER — FUROSEMIDE 20 MG PO TABS: 20.0000 mg | ORAL_TABLET | Freq: Every day | ORAL | 3 refills | Status: AC

## 2024-05-11 MED ORDER — ROSUVASTATIN CALCIUM 20 MG PO TABS: 20.0000 mg | ORAL_TABLET | Freq: Every day | ORAL | 1 refills | Status: AC

## 2024-05-11 NOTE — Patient Instructions (Signed)

## 2024-05-11 NOTE — Progress Notes (Addendum)
 Subjective:  Patient ID: Dawn Anthony, female    DOB: 1950/12/03, 73 y.o.   MRN: [id number]  Patient Care Team: Severa Rock HERO, FNP as PCP - General (Family Medicine) Bertrum Rosina HERO, RN as [facility] Care Management (General Practice) Bertrum Rosina HERO, RN   Chief Complaint:  Medical Management of Chronic Issues   HPI: Dawn Anthony is a 73 y.o. female presenting on 05/11/2024 for Medical Management of Chronic Issues   Dawn Anthony is a 73 year old female with diabetes, hypertension, and hyperlipidemia who presents for follow-up of her chronic conditions.  Glycemic control and insulin  delivery - Diabetes mellitus with recent hemoglobin A1c of 13% at endocrinology visit on November 3rd - No changes made to diabetes management at last endocrinology visit - Difficulty with CGM system due to knocking off discs; grandson recently assisted in securing the device - Next endocrinology appointment scheduled for February  Hypertension and antihypertensive therapy - Elevated home blood pressure readings around 140/82 mmHg - Adherent to current antihypertensive regimen: lisinopril  20 mg twice daily and carvedilol  12.5 mg twice daily - Expresses concern about blood pressure being 'a little high' despite medication adherence  Hyperlipidemia management - Continues rosuvastatin for cholesterol management  Medication allergy - History of allergic reaction to hydrochlorothiazide, associated with pancreatitis  Cardiopulmonary and neurological symptoms - No recent chest pain, significant headaches, leg swelling, weakness, or confusion  Psychological symptoms - Past episode of shortness of breath and anxiety related to a legal issue, now resolved - Resolution of legal issue has alleviated anxiety          Relevant past medical, surgical, family, and social history reviewed and updated as indicated.  Allergies and medications reviewed and updated. Data reviewed: Chart in  Epic.   Past Medical History:  Diagnosis Date   Diabetes mellitus without complication (HCC)    Gastritis    Hyperlipidemia    Hypertension    Thyroid  disease     Past Surgical History:  Procedure Laterality Date   CORONARY ARTERY BYPASS GRAFT  2022   EYE SURGERY     KNEE SURGERY Bilateral    OTHER SURGICAL HISTORY     shoulder surgery   THYROID  SURGERY      Social History   Socioeconomic History   Marital status: Widowed    Spouse name: Not on file   Number of children: 1   Years of education: Not on file   Highest education level: 12th grade  Occupational History   Not on file  Tobacco Use   Smoking status: Never   Smokeless tobacco: Never  Vaping Use   Vaping status: Never Used  Substance and Sexual Activity   Alcohol use: Never   Drug use: Never   Sexual activity: Not Currently  Other Topics Concern   Not on file  Social History Narrative   Grandson lives with her.    Social Drivers of Corporate Investment Banker Strain: Low Risk  (05/09/2024)   Overall Financial Resource Strain (CARDIA)    Difficulty of Paying Living Expenses: Not very hard  Food Insecurity: Food Insecurity Present (05/09/2024)   Hunger Vital Sign    Worried About Running Out of Food in the Last Year: Sometimes true    Ran Out of Food in the Last Year: Sometimes true  Transportation Needs: No Transportation Needs (05/09/2024)   PRAPARE - Administrator, Civil Service (Medical): No    Lack of Transportation (Non-Medical): No  Physical Activity:  Insufficiently Active (05/09/2024)   Exercise Vital Sign    Days of Exercise per Week: 4 days    Minutes of Exercise per Session: 30 min  Stress: No Stress Concern Present (05/09/2024)   [employer] of Occupational Health - Occupational Stress Questionnaire    Feeling of Stress: Not at all  Social Connections: Moderately Isolated (05/09/2024)   Social Connection and Isolation Panel    Frequency of Communication with  Friends and Family: More than three times a week    Frequency of Social Gatherings with Friends and Family: More than three times a week    Attends Religious Services: 1 to 4 times per year    Active Member of [employer] or Organizations: No    Attends Banker Meetings: Not on file    Marital Status: Widowed  Intimate Partner Violence: Not At Risk (12/29/2023)   Humiliation, Afraid, Rape, and Kick questionnaire    Fear of Current or Ex-Partner: No    Emotionally Abused: No    Physically Abused: No    Sexually Abused: No    Outpatient Encounter Medications as of 05/11/2024  Medication Sig   Accu-Chek Softclix Lancets lancets Use as instructed to monitor glucose 4 times daily   aspirin EC 81 MG tablet Take 81 mg by mouth daily. Swallow whole.   Blood Pressure Monitoring (BLOOD PRESSURE MONITOR AUTOMAT) DEVI 1 Device by Does not apply route in the morning and at bedtime.   Calcium-Magnesium-Vitamin D  (CALCIUM 1200+D3 PO) Take 1 tablet by mouth daily.   carvedilol  (COREG ) 12.5 MG tablet Take 1 tablet (12.5 mg total) by mouth 2 (two) times daily with a meal.   cholecalciferol  (VITAMIN D3) 25 MCG (1000 UT) tablet Take 50 Units by mouth daily.   Continuous Glucose Sensor (FREESTYLE LIBRE 3 PLUS SENSOR) MISC Change sensor every 15 days.   furosemide (LASIX) 20 MG tablet Take 1 tablet (20 mg total) by mouth daily.   glucose blood (ACCU-CHEK GUIDE TEST) test strip Use as instructed to monitor glucose 4 times daily   insulin  degludec (TRESIBA  FLEXTOUCH) 100 UNIT/ML FlexTouch Pen Inject 25 Units into the skin at bedtime.   insulin  lispro (HUMALOG  KWIKPEN) 100 UNIT/ML KwikPen Inject 8-14 Units into the skin 3 (three) times daily.   Insulin  Pen Needle (BD PEN NEEDLE NANO U/F) 32G X 4 MM MISC UAD QID Dx E11.69   levothyroxine  (SYNTHROID ) 75 MCG tablet Take 1 tablet (75 mcg total) by mouth daily before breakfast.   lisinopril  (ZESTRIL ) 20 MG tablet Take 1 tablet (20 mg total) by mouth 2 (two)  times daily.   metFORMIN  (GLUCOPHAGE ) 1000 MG tablet Take 1 tablet (1,000 mg total) by mouth 2 (two) times daily with a meal.   rosuvastatin (CRESTOR) 20 MG tablet Take 1 tablet (20 mg total) by mouth daily.   [DISCONTINUED] hydrOXYzine (ATARAX) 10 MG tablet Take 0.5-1 tablets (5-10 mg total) by mouth 3 (three) times daily as needed.   [DISCONTINUED] rosuvastatin (CRESTOR) 20 MG tablet TAKE 1 TABLET BY MOUTH EVERYDAY AT BEDTIME   No facility-administered encounter medications on file as of 05/11/2024.    Allergies  Allergen Reactions   Hydrochlorothiazide     Other Reaction(s): Unknown-Unspecified  Stopped due to pancreatitis   Insulin  Glargine-Lixisenatide Nausea And Vomiting   Simvastatin     Other Reaction(s): Unknown-Unspecified  Stopped due to pancreatitis  Stopped due to pancreatitis   Penicillins Hives   Amoxicillin Itching, Rash and Dermatitis   Cat Dander Itching, Rash and Dermatitis  Latex Other (See Comments), Rash and Dermatitis    Pertinent ROS per HPI, otherwise unremarkable      Objective:  BP (!) 148/82   Pulse 72   Temp (!) 97.2 F (36.2 C)   Ht 5' 1 (1.549 m)   Wt 147 lb 12.8 oz (67 kg)   SpO2 96%   BMI 27.93 kg/m    Wt Readings from Last 3 Encounters:  05/11/24 147 lb 12.8 oz (67 kg)  04/26/24 146 lb (66.2 kg)  03/24/24 148 lb (67.1 kg)    Physical Exam Vitals and nursing note reviewed.  Constitutional:      Appearance: Normal appearance. She is overweight.  HENT:     Head: Normocephalic and atraumatic.     Mouth/Throat:     Mouth: Mucous membranes are moist.     Pharynx: Oropharynx is clear.  Eyes:     Conjunctiva/sclera: Conjunctivae normal.     Pupils: Pupils are equal, round, and reactive to light.  Cardiovascular:     Rate and Rhythm: Normal rate and regular rhythm.     Heart sounds: Normal heart sounds.  Pulmonary:     Effort: Pulmonary effort is normal.     Breath sounds: Normal breath sounds.  Musculoskeletal:      Cervical back: Neck supple.     Right lower leg: No edema.     Left lower leg: No edema.  Skin:    General: Skin is warm and dry.     Capillary Refill: Capillary refill takes less than 2 seconds.  Neurological:     General: No focal deficit present.     Mental Status: She is alert and oriented to person, place, and time.  Psychiatric:        Mood and Affect: Mood normal.        Behavior: Behavior normal. Behavior is cooperative.        Thought Content: Thought content normal.        Judgment: Judgment normal.      Results for orders placed or performed in visit on 04/26/24  HgB A1c   Collection Time: 04/26/24 11:15 AM  Result Value Ref Range   Hemoglobin A1C 13.0 (A) 4.0 - 5.6 %   HbA1c POC (<> result, manual entry)     HbA1c, POC (prediabetic range)     HbA1c, POC (controlled diabetic range)         Pertinent labs & imaging results that were available during my care of the patient were reviewed by me and considered in my medical decision making.  Assessment & Plan:  Dawn Anthony was seen today for medical management of chronic issues.  Diagnoses and all orders for this visit:  Type 2 diabetes mellitus without complication, with long-term current use of insulin  (HCC) -     rosuvastatin (CRESTOR) 20 MG tablet; Take 1 tablet (20 mg total) by mouth daily.  Hypertension associated with diabetes (HCC) -     rosuvastatin (CRESTOR) 20 MG tablet; Take 1 tablet (20 mg total) by mouth daily. -     furosemide (LASIX) 20 MG tablet; Take 1 tablet (20 mg total) by mouth daily.  Hyperlipidemia associated with type 2 diabetes mellitus (HCC) -     rosuvastatin (CRESTOR) 20 MG tablet; Take 1 tablet (20 mg total) by mouth daily.  S/P CABG x 3 -     rosuvastatin (CRESTOR) 20 MG tablet; Take 1 tablet (20 mg total) by mouth daily.  Need for viral immunization  Hypertension Blood pressure is elevated at 148/82 mmHg despite current regimen of lisinopril  40 mg daily and carvedilol  12.5  mg twice daily. Home readings are around 142 mmHg. No significant symptoms such as headaches, leg swelling, weakness, or confusion reported. Allergy to hydrochlorothiazide due to pancreatitis. - Started Lasix (furosemide) once daily in the morning. - Continue lisinopril  40 mg daily and carvedilol  12.5 mg twice daily. - Scheduled follow-up with nurse in two weeks to check blood pressure and perform lab work to assess kidney function. - Target blood pressure is 130/80 mmHg.  Type 2 diabetes mellitus A1c is elevated at 13%. Missed insulin  doses reported. Endocrinology follow-up scheduled for February. - Continue current diabetes management regimen. - Follow up with endocrinology in February.  Hyperlipidemia Currently managed with rosuvastatin. - Continue rosuvastatin as prescribed.      Immunizations updated today    Continue all other maintenance medications.  Follow up plan: Return for 2 weeks for nurse BP and lab BMP, 6 months CPE.   Continue healthy lifestyle choices, including diet (rich in fruits, vegetables, and lean proteins, and low in salt and simple carbohydrates) and exercise (at least 30 minutes of moderate physical activity daily).  Educational handout given for DASH diet, HTN  The above assessment and management plan was discussed with the patient. The patient verbalized understanding of and has agreed to the management plan. Patient is aware to call the clinic if they develop any new symptoms or if symptoms persist or worsen. Patient is aware when to return to the clinic for a follow-up visit. Patient educated on when it is appropriate to go to the emergency department.   Dawn Bruns, FNP-C [facility] [phone]

## 2024-05-25 ENCOUNTER — Ambulatory Visit: Admitting: *Deleted

## 2024-05-25 ENCOUNTER — Other Ambulatory Visit

## 2024-05-25 VITALS — BP 127/81 | HR 72

## 2024-05-25 DIAGNOSIS — E1159 Type 2 diabetes mellitus with other circulatory complications: Secondary | ICD-10-CM

## 2024-05-25 DIAGNOSIS — I152 Hypertension secondary to endocrine disorders: Secondary | ICD-10-CM | POA: Diagnosis not present

## 2024-05-25 LAB — BASIC METABOLIC PANEL WITH GFR
BUN/Creatinine Ratio: 24 (ref 12–28)
BUN: 18 mg/dL (ref 8–27)
CO2: 24 mmol/L (ref 20–29)
Calcium: 9.7 mg/dL (ref 8.7–10.3)
Chloride: 97 mmol/L (ref 96–106)
Creatinine, Ser: 0.76 mg/dL (ref 0.57–1.00)
Glucose: 343 mg/dL — ABNORMAL HIGH (ref 70–99)
Potassium: 4.1 mmol/L (ref 3.5–5.2)
Sodium: 136 mmol/L (ref 134–144)
eGFR: 84 mL/min/1.73 (ref >59)

## 2024-05-25 NOTE — Progress Notes (Signed)
 Patient is in office today for a nurse visit for Blood Pressure Check. Blood pressure today 127/81, heart rate 72.

## 2024-05-26 ENCOUNTER — Ambulatory Visit: Payer: Self-pay | Admitting: Family Medicine

## 2024-07-07 ENCOUNTER — Other Ambulatory Visit (HOSPITAL_COMMUNITY): Payer: Self-pay | Admitting: Family Medicine

## 2024-07-07 DIAGNOSIS — Z1231 Encounter for screening mammogram for malignant neoplasm of breast: Secondary | ICD-10-CM

## 2024-07-16 ENCOUNTER — Ambulatory Visit (HOSPITAL_COMMUNITY): Admission: RE | Admit: 2024-07-16 | Discharge: 2024-07-16 | Disposition: A | Source: Ambulatory Visit

## 2024-07-16 DIAGNOSIS — Z1231 Encounter for screening mammogram for malignant neoplasm of breast: Secondary | ICD-10-CM | POA: Insufficient documentation

## 2024-07-16 DIAGNOSIS — R928 Other abnormal and inconclusive findings on diagnostic imaging of breast: Secondary | ICD-10-CM | POA: Diagnosis not present

## 2024-07-20 ENCOUNTER — Other Ambulatory Visit (HOSPITAL_COMMUNITY): Payer: Self-pay | Admitting: Family Medicine

## 2024-07-20 ENCOUNTER — Ambulatory Visit: Payer: Self-pay | Admitting: Family Medicine

## 2024-07-20 DIAGNOSIS — R928 Other abnormal and inconclusive findings on diagnostic imaging of breast: Secondary | ICD-10-CM

## 2024-07-21 NOTE — Progress Notes (Signed)
 Patient has appt 08/05/2024 at [facility]

## 2024-07-23 ENCOUNTER — Other Ambulatory Visit

## 2024-07-24 LAB — T4, FREE: Free T4: 1.57 ng/dL (ref 0.82–1.77)

## 2024-07-24 LAB — COMPREHENSIVE METABOLIC PANEL WITH GFR
ALT: 11 [IU]/L (ref 0–32)
AST: 16 [IU]/L (ref 0–40)
Albumin: 4.4 g/dL (ref 3.8–4.8)
Alkaline Phosphatase: 67 [IU]/L (ref 49–135)
BUN/Creatinine Ratio: 23 (ref 12–28)
BUN: 19 mg/dL (ref 8–27)
Bilirubin Total: 0.4 mg/dL (ref 0.0–1.2)
CO2: 24 mmol/L (ref 20–29)
Calcium: 9.6 mg/dL (ref 8.7–10.3)
Chloride: 100 mmol/L (ref 96–106)
Creatinine, Ser: 0.82 mg/dL (ref 0.57–1.00)
Globulin, Total: 1.9 g/dL (ref 1.5–4.5)
Glucose: 222 mg/dL — ABNORMAL HIGH (ref 70–99)
Potassium: 4.4 mmol/L (ref 3.5–5.2)
Sodium: 139 mmol/L (ref 134–144)
Total Protein: 6.3 g/dL (ref 6.0–8.5)
eGFR: 76 mL/min/{1.73_m2}

## 2024-07-24 LAB — TSH: TSH: 1.47 u[IU]/mL (ref 0.450–4.500)

## 2024-08-03 ENCOUNTER — Ambulatory Visit: Admitting: Nurse Practitioner

## 2024-08-05 ENCOUNTER — Encounter (HOSPITAL_COMMUNITY)

## 2024-08-05 ENCOUNTER — Ambulatory Visit (HOSPITAL_COMMUNITY)

## 2024-11-09 ENCOUNTER — Encounter: Admitting: Family Medicine
